# Patient Record
Sex: Female | Born: 1975 | Race: White | Hispanic: No | Marital: Married | Smoking: Former smoker
Health system: Southern US, Community
[De-identification: ages and names within clinical notes are randomized; demographics above are authoritative.]

## PROBLEM LIST (undated history)

## (undated) ENCOUNTER — Inpatient Hospital Stay (HOSPITAL_COMMUNITY): Payer: Self-pay

## (undated) DIAGNOSIS — F329 Major depressive disorder, single episode, unspecified: Secondary | ICD-10-CM

## (undated) DIAGNOSIS — I499 Cardiac arrhythmia, unspecified: Secondary | ICD-10-CM

## (undated) DIAGNOSIS — R519 Headache, unspecified: Secondary | ICD-10-CM

## (undated) DIAGNOSIS — E079 Disorder of thyroid, unspecified: Secondary | ICD-10-CM

## (undated) DIAGNOSIS — I2699 Other pulmonary embolism without acute cor pulmonale: Secondary | ICD-10-CM

## (undated) DIAGNOSIS — R011 Cardiac murmur, unspecified: Secondary | ICD-10-CM

## (undated) DIAGNOSIS — R51 Headache: Secondary | ICD-10-CM

## (undated) DIAGNOSIS — O09529 Supervision of elderly multigravida, unspecified trimester: Secondary | ICD-10-CM

## (undated) DIAGNOSIS — F32A Depression, unspecified: Secondary | ICD-10-CM

## (undated) DIAGNOSIS — R000 Tachycardia, unspecified: Secondary | ICD-10-CM

## (undated) DIAGNOSIS — B019 Varicella without complication: Secondary | ICD-10-CM

## (undated) DIAGNOSIS — R32 Unspecified urinary incontinence: Secondary | ICD-10-CM

## (undated) DIAGNOSIS — F418 Other specified anxiety disorders: Secondary | ICD-10-CM

## (undated) HISTORY — PX: TONSILLECTOMY: SUR1361

## (undated) HISTORY — DX: Varicella without complication: B01.9

## (undated) HISTORY — PX: AUGMENTATION MAMMAPLASTY: SUR837

## (undated) HISTORY — DX: Headache, unspecified: R51.9

## (undated) HISTORY — DX: Depression, unspecified: F32.A

## (undated) HISTORY — PX: TUBAL LIGATION: SHX77

## (undated) HISTORY — PX: BREAST ENHANCEMENT SURGERY: SHX7

## (undated) HISTORY — DX: Disorder of thyroid, unspecified: E07.9

## (undated) HISTORY — DX: Other specified anxiety disorders: F41.8

## (undated) HISTORY — DX: Major depressive disorder, single episode, unspecified: F32.9

## (undated) HISTORY — PX: KIDNEY SURGERY: SHX687

## (undated) HISTORY — DX: Headache: R51

## (undated) HISTORY — DX: Unspecified urinary incontinence: R32

## (undated) HISTORY — PX: ABDOMINOPLASTY: SUR9

## (undated) HISTORY — PX: MANDIBLE SURGERY: SHX707

## (undated) HISTORY — DX: Cardiac arrhythmia, unspecified: I49.9

---

## 1998-03-19 ENCOUNTER — Ambulatory Visit (HOSPITAL_COMMUNITY): Admission: RE | Admit: 1998-03-19 | Discharge: 1998-03-19 | Payer: Self-pay | Admitting: Urology

## 2000-03-22 ENCOUNTER — Ambulatory Visit (HOSPITAL_COMMUNITY): Admission: RE | Admit: 2000-03-22 | Discharge: 2000-03-22 | Payer: Self-pay | Admitting: Obstetrics & Gynecology

## 2000-06-09 ENCOUNTER — Inpatient Hospital Stay (HOSPITAL_COMMUNITY): Admission: AD | Admit: 2000-06-09 | Discharge: 2000-06-09 | Payer: Self-pay | Admitting: Obstetrics & Gynecology

## 2000-06-10 ENCOUNTER — Inpatient Hospital Stay (HOSPITAL_COMMUNITY): Admission: AD | Admit: 2000-06-10 | Discharge: 2000-06-10 | Payer: Self-pay | Admitting: Obstetrics and Gynecology

## 2000-06-18 ENCOUNTER — Inpatient Hospital Stay (HOSPITAL_COMMUNITY): Admission: AD | Admit: 2000-06-18 | Discharge: 2000-06-21 | Payer: Self-pay | Admitting: Obstetrics and Gynecology

## 2000-07-08 ENCOUNTER — Inpatient Hospital Stay (HOSPITAL_COMMUNITY): Admission: AD | Admit: 2000-07-08 | Discharge: 2000-07-08 | Payer: Self-pay | Admitting: Obstetrics and Gynecology

## 2000-11-12 ENCOUNTER — Other Ambulatory Visit: Admission: RE | Admit: 2000-11-12 | Discharge: 2000-11-12 | Payer: Self-pay | Admitting: Obstetrics and Gynecology

## 2001-09-20 ENCOUNTER — Other Ambulatory Visit: Admission: RE | Admit: 2001-09-20 | Discharge: 2001-09-20 | Payer: Self-pay | Admitting: Obstetrics and Gynecology

## 2002-02-17 ENCOUNTER — Inpatient Hospital Stay (HOSPITAL_COMMUNITY): Admission: AD | Admit: 2002-02-17 | Discharge: 2002-02-17 | Payer: Self-pay | Admitting: Obstetrics & Gynecology

## 2002-02-17 ENCOUNTER — Encounter: Payer: Self-pay | Admitting: Obstetrics & Gynecology

## 2002-04-02 ENCOUNTER — Inpatient Hospital Stay (HOSPITAL_COMMUNITY): Admission: AD | Admit: 2002-04-02 | Discharge: 2002-04-07 | Payer: Self-pay | Admitting: Obstetrics & Gynecology

## 2002-04-02 ENCOUNTER — Encounter: Payer: Self-pay | Admitting: Obstetrics & Gynecology

## 2002-04-08 ENCOUNTER — Inpatient Hospital Stay (HOSPITAL_COMMUNITY): Admission: AD | Admit: 2002-04-08 | Discharge: 2002-04-16 | Payer: Self-pay | Admitting: Obstetrics and Gynecology

## 2002-05-05 ENCOUNTER — Inpatient Hospital Stay (HOSPITAL_COMMUNITY): Admission: AD | Admit: 2002-05-05 | Discharge: 2002-05-07 | Payer: Self-pay | Admitting: Obstetrics and Gynecology

## 2002-05-05 ENCOUNTER — Encounter (INDEPENDENT_AMBULATORY_CARE_PROVIDER_SITE_OTHER): Payer: Self-pay | Admitting: Specialist

## 2002-05-08 ENCOUNTER — Encounter: Admission: RE | Admit: 2002-05-08 | Discharge: 2002-06-07 | Payer: Self-pay | Admitting: Obstetrics & Gynecology

## 2002-05-11 ENCOUNTER — Inpatient Hospital Stay (HOSPITAL_COMMUNITY): Admission: AD | Admit: 2002-05-11 | Discharge: 2002-05-11 | Payer: Self-pay | Admitting: Obstetrics & Gynecology

## 2002-06-08 ENCOUNTER — Encounter: Admission: RE | Admit: 2002-06-08 | Discharge: 2002-07-08 | Payer: Self-pay | Admitting: Obstetrics & Gynecology

## 2002-08-07 ENCOUNTER — Encounter: Admission: RE | Admit: 2002-08-07 | Discharge: 2002-09-06 | Payer: Self-pay | Admitting: Obstetrics & Gynecology

## 2003-03-31 ENCOUNTER — Other Ambulatory Visit: Admission: RE | Admit: 2003-03-31 | Discharge: 2003-03-31 | Payer: Self-pay | Admitting: Obstetrics & Gynecology

## 2003-12-30 ENCOUNTER — Inpatient Hospital Stay (HOSPITAL_COMMUNITY): Admission: AD | Admit: 2003-12-30 | Discharge: 2003-12-30 | Payer: Self-pay | Admitting: Obstetrics and Gynecology

## 2004-02-12 ENCOUNTER — Inpatient Hospital Stay (HOSPITAL_COMMUNITY): Admission: RE | Admit: 2004-02-12 | Discharge: 2004-02-14 | Payer: Self-pay | Admitting: Obstetrics and Gynecology

## 2004-06-16 ENCOUNTER — Other Ambulatory Visit: Admission: RE | Admit: 2004-06-16 | Discharge: 2004-06-16 | Payer: Self-pay | Admitting: Obstetrics and Gynecology

## 2004-07-22 ENCOUNTER — Encounter (INDEPENDENT_AMBULATORY_CARE_PROVIDER_SITE_OTHER): Payer: Self-pay | Admitting: *Deleted

## 2004-07-22 ENCOUNTER — Ambulatory Visit (HOSPITAL_COMMUNITY): Admission: RE | Admit: 2004-07-22 | Discharge: 2004-07-22 | Payer: Self-pay | Admitting: Obstetrics and Gynecology

## 2004-11-10 ENCOUNTER — Other Ambulatory Visit: Admission: RE | Admit: 2004-11-10 | Discharge: 2004-11-10 | Payer: Self-pay | Admitting: Obstetrics and Gynecology

## 2005-03-29 ENCOUNTER — Other Ambulatory Visit: Admission: RE | Admit: 2005-03-29 | Discharge: 2005-03-29 | Payer: Self-pay | Admitting: Obstetrics and Gynecology

## 2005-10-19 ENCOUNTER — Emergency Department (HOSPITAL_COMMUNITY): Admission: EM | Admit: 2005-10-19 | Discharge: 2005-10-19 | Payer: Self-pay | Admitting: Family Medicine

## 2006-08-09 ENCOUNTER — Ambulatory Visit (HOSPITAL_COMMUNITY): Admission: RE | Admit: 2006-08-09 | Discharge: 2006-08-09 | Payer: Self-pay | Admitting: Obstetrics and Gynecology

## 2006-11-09 ENCOUNTER — Emergency Department (HOSPITAL_COMMUNITY): Admission: EM | Admit: 2006-11-09 | Discharge: 2006-11-10 | Payer: Self-pay | Admitting: Emergency Medicine

## 2006-12-19 ENCOUNTER — Ambulatory Visit: Payer: Self-pay | Admitting: Internal Medicine

## 2006-12-19 LAB — CONVERTED CEMR LAB
CRP, High Sensitivity: 2 (ref 0.00–5.00)
IgA: 264 mg/dL (ref 68–378)
Sed Rate: 4 mm/hr (ref 0–25)
Tissue Transglutaminase Ab, IgA: 0.4 units (ref <7)

## 2006-12-20 ENCOUNTER — Encounter: Payer: Self-pay | Admitting: Internal Medicine

## 2007-01-01 ENCOUNTER — Encounter: Payer: Self-pay | Admitting: Internal Medicine

## 2007-01-01 ENCOUNTER — Ambulatory Visit: Payer: Self-pay | Admitting: Internal Medicine

## 2007-07-19 DIAGNOSIS — R51 Headache: Secondary | ICD-10-CM | POA: Insufficient documentation

## 2007-07-19 DIAGNOSIS — F411 Generalized anxiety disorder: Secondary | ICD-10-CM | POA: Insufficient documentation

## 2007-07-19 DIAGNOSIS — N816 Rectocele: Secondary | ICD-10-CM

## 2007-07-19 DIAGNOSIS — I10 Essential (primary) hypertension: Secondary | ICD-10-CM | POA: Insufficient documentation

## 2007-07-19 DIAGNOSIS — R519 Headache, unspecified: Secondary | ICD-10-CM | POA: Insufficient documentation

## 2007-07-19 DIAGNOSIS — K589 Irritable bowel syndrome without diarrhea: Secondary | ICD-10-CM

## 2008-04-24 HISTORY — PX: AUGMENTATION MAMMAPLASTY: SUR837

## 2009-07-05 ENCOUNTER — Emergency Department (HOSPITAL_COMMUNITY): Admission: EM | Admit: 2009-07-05 | Discharge: 2009-07-05 | Payer: Self-pay | Admitting: Emergency Medicine

## 2010-07-18 LAB — RAPID URINE DRUG SCREEN, HOSP PERFORMED
Amphetamines: NOT DETECTED
Opiates: NOT DETECTED
Tetrahydrocannabinol: NOT DETECTED

## 2010-07-18 LAB — DIFFERENTIAL
Eosinophils Absolute: 0 10*3/uL (ref 0.0–0.7)
Lymphs Abs: 1.9 10*3/uL (ref 0.7–4.0)
Monocytes Relative: 5 % (ref 3–12)
Neutrophils Relative %: 63 % (ref 43–77)

## 2010-07-18 LAB — BASIC METABOLIC PANEL
Chloride: 104 mEq/L (ref 96–112)
Creatinine, Ser: 0.8 mg/dL (ref 0.4–1.2)
GFR calc Af Amer: >60 mL/min (ref >60)
Potassium: 3.7 mEq/L (ref 3.5–5.1)
Sodium: 139 mEq/L (ref 135–145)

## 2010-07-18 LAB — POCT PREGNANCY, URINE: Preg Test, Ur: NEGATIVE

## 2010-07-18 LAB — CBC
HCT: 39.7 % (ref 36.0–46.0)
MCV: 96.1 fL (ref 78.0–100.0)
RBC: 4.14 MIL/uL (ref 3.87–5.11)
WBC: 6 10*3/uL (ref 4.0–10.5)

## 2010-07-18 LAB — ETHANOL: Alcohol, Ethyl (B): <5 mg/dL (ref 0–10)

## 2010-09-06 NOTE — Assessment & Plan Note (Signed)
[facility]                         GASTROENTEROLOGY OFFICE NOTE   NAME:[name], [name]                           MRN:          [id number]  DATE:12/19/2006                            DOB:          [birth year]    PRIMARY CARE PHYSICIAN:  Marjory Lies, M.D.   GYNECOLOGIST:  Miguel Aschoff, M.D.   CHIEF COMPLAINT:  Weight loss, diarrhea, abdominal pain.   ASSESSMENT:  A 35 year old white woman with chronic history of irritable  bowel and interstitial cystitis; however, since March, she has had  worsening left lower quadrant pain, diarrhea, and has lost weight, about  15 pounds, and she cannot gain that back.  The leading differential  diagnosis includes irritable bowel syndrome, which is commonly  associated with interstitial cystitis, celiac disease, and inflammatory  bowel disease, and infection (question Clostridium difficile) are all  possibilities as well.  I suspect she certainly does have irritable  bowel syndrome, but we need to be more sure of that.   PLAN:  1. Schedule colonoscopy with random biopsies.  2. Sprue panel, IgA level, sed rate, C-reactive protein, C. diff      toxin, and stool for lactoferrin.  Release of information from Dr.      Doristine Counter for recent labs.  3. Lomotil 1-2 q.a.c. and at bedtime, #90 prescribed.   I have reviewed risks, benefits and indications of colonoscopy. She  understands and agrees to proceed.   HISTORY:  As above.  This lady has had a long history of irritable  bowel, going back to her childhood; however, since March, she has had  worsening problems with crampy left lower quadrant pain and extreme  gastrocolic reflux.  She will have nocturnal stools as well, and she  says that this is different than any other irritable bowel problems that  she has had.  She says her stools have a bad odor.  Her infant son, or  at least toddler son, has had problems for two years and is better but  has had an extensive GI workup,  including endoscopic evaluation for  similar problems, but nothing was found.  The pain she gets will make  her nauseous and vomit at times.  The vomiting is not that common.  She  will have a couple of days where things are okay.  She cannot really eat  red meat or coffee.  She was given Symax Duotabs with little benefit, if  any.  Imodium seems to nauseate her.  Her weight is at a level to where  it was prior to having children but is down about 15 pounds.  She denies  any skin rash.  She has night sweats and says she has been febrile as  much as 101 or 102.  She was treated for a persistent urinary tract  infection over the summer.  Her symptoms antedate that.  She is not  describing bleeding.  The remainder of her GI review of systems appears  negative.   PAST MEDICAL HISTORY:  1. Interstitial cystitis.  2. Genitourinary surgery for vesicoureteral reflux.  3. Rectocele.  4. Numbness,  tingling, question multiple sclerosis.  Neurologic workup      with MRI, CT, EEG negative.  Due to see a rheumatologist, question      fibromyalgia.  5. Abdominoplasty in the past.  6. Chronic headaches.  7. Anxiety.  8. Irritable bowel syndrome.  9. Conization biopsy for abnormal Pap smear with dysplasia.  10.History of cesarean section.  11.History of dilatation and curettage.   FAMILY HISTORY:  Mother and grandmother have had endometrial carcinoma.  Mother has had heart disease.  Father had heart disease and  grandparents.  Diabetes in her mother and a grandparent.  Breast cancer  in an aunt and a grandmother.  An aunt has Crohn's disease.  Grandmother  had colon cancer.   SOCIAL HISTORY:  She is married.  She has a B.S. degree.  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  Some alcohol.  No tobacco or drugs.  Three children.  One son, two daughters.  Lives with her husband and children.   REVIEW OF SYSTEMS:  See my medical history  form for full details,  otherwise as above.   PHYSICAL EXAMINATION:  A well-developed and well-nourished but thin  white woman.  Height 5 feet 3.  Weight 106 pounds.  Her Body Mass Index would be 18.8  kg/m2, which is normal.  Weight is just low normal, underweight would be  less than 104 pounds.  HEENT:  Eyes are anicteric.  Normal mouth, lips, and pharynx.  NECK:  Supple.  No thyromegaly or mass.  CHEST:  Clear.  HEART:  S1 and S2.  No murmurs, rubs or gallops.  ABDOMEN:  She has some mild tenderness in the epigastric region  inferiorly, what feels like over the transverse colon.  There is a  little bit of fullness there.  There is no mass.  Her aorta is palpable  but normal for a thin person.  There is no organomegaly or mass.  RECTAL:  Deferred.  LYMPHATIC:  No neck, supraclavicular, or axillary adenopathy.  EXTREMITIES:  Free of edema.  SKIN:  No rash.  PSYCH:  She is alert and oriented x3.   I have reviewed Dr. Logan Bores' notes as well.  Also notes from Dr.  Aldean Ast.   I appreciate the opportunity to care for this patient.     Iva Boop, MD,FACG  Electronically Signed    CEG/MedQ  DD: 12/19/2006  DT: 12/19/2006  Job #: [id number]   cc:   Jamison Neighbor, M.D.  Marjory Lies, M.D.  Miguel Aschoff, M.D.

## 2010-09-09 NOTE — Op Note (Signed)
NAMESTAPHANY, [name] [name]                              ACCOUNT NO.:  [id number]   MEDICAL RECORD NO.:  [id number]                   PATIENT TYPE:  INP   LOCATION:  9198                                 [facility]ILITY:  [facility]   PHYSICIAN:  Ilda Mori, M.D.                DATE OF BIRTH:  1975-04-25   DATE OF PROCEDURE:  05/05/2002  DATE OF DISCHARGE:                                 OPERATIVE REPORT   PREOPERATIVE DIAGNOSES:  1. Placenta previa.  2. Third trimester bleeding.  3. Oligohydramnios.  4. Fetal growth restriction.   POSTOPERATIVE DIAGNOSES:  1. Placenta previa.  2. Third trimester bleeding.  3. Oligohydramnios.  4. Fetal growth restriction.  5. Velamentous insertion of the umbilical cord.   PROCEDURE:  Primary low transverse cesarean section.   SURGEON:  Ilda Mori, M.D.   ASSISTANT:  Malva Limes, M.D.   ANESTHESIA:  Spinal.   ESTIMATED BLOOD LOSS:  1000 cubic centimeters.   FINDINGS:  Female infant.  Apgar scores 8 and 8.  Birth weight 4 pounds 3  ounces.  Clear amniotic fluid.  Placenta previa.  Velamentous insertion of  the cord.  Normal appearing tubes and ovaries.   INDICATIONS:  This is a 35 year old gravida 3, para 1 with estimated date of  confinement of February 28 at [redacted] weeks gestation who is admitted with third  trimester bleeding from a known placenta previa.  The patient was seen in  the office earlier today prior to the onset of bleeding and an ultrasound  was done at that time which showed fetal growth restriction and  oligohydramnios.  The estimated fetal weight was less than fifth percentile  and the amniotic fluid volume was 5.3.  During the office visit the patient  began bleeding and in view of her bleeding, known placenta previa, and  evidence of fetal compromise, decision was made to proceed with delivery.   PROCEDURE:  The patient was taken to the operating room and spinal  anesthesia was placed.  She was then placed in the supine position  with left  lateral displacement of the uterus and the abdomen was prepped and draped in  a sterile fashion.  The bladder was catheterized.  A lower transverse  incision was made through the previous laparotomy scar and taken down to the  fascia which was then extended transversely.  The rectus muscle was  dissected from the fascial sheath and then divided in the midline.  The  peritoneal cavity was entered sharply, extended vertically.  The lower  segment was identified and a transverse incision was made through the  serosa.  The bladder was displaced inferiorly and the incision was carried  down to the amniotic cavity.  The transverse incision was extended bluntly.  The infant was delivered with fundal pressure.  Cord blood was obtained.  The placenta was delivered with cord traction.  The uterine cavity was  then  bluntly curettaged to remove the placental fragments and membranes.  The  lower segment was then closed with a running interlocking Vicryl 1 suture.  The bladder plane was not reopposed.  The perineum and rectus muscle was  closed in the midline.  The fascia was closed with a running 0 Vicryl suture  and the skin was closed with staples.  The patient tolerated procedure well  and left the operating room in good condition.                                               Ilda Mori, M.D.    RK/MEDQ  D:  05/05/2002  T:  05/05/2002  Job:  [id number]

## 2010-09-09 NOTE — Discharge Summary (Signed)
[name] [name], [name] [name]                  ACCOUNT NO.:  [id number]   MEDICAL RECORD NO.:  [id number]          PATIENT TYPE:  INP   LOCATION:  9112                          [facility]ILITY[facility]   PHYSICIAN:  Gerrit Friends. Aldona Bar, M.D.   DATE OF BIRTH:  [birth year]   DATE OF ADMISSION:  02/12/2004  DATE OF DISCHARGE:  02/14/2004                                 DISCHARGE SUMMARY   DISCHARGE DIAGNOSES:  1.  Term pregnancy, delivered 7 pound 9 ounce female infant, Apgars 9 and 9.  2.  Blood type A positive.  3.  Previous cesarean section/desire for repeat cesarean section.   PROCEDURES:  Repeat low transverse cesarean section.   SUMMARY:  This 35 year old, gravida 4, para 2, was taken to the operating  room on the day of admission for repeat low transverse cesarean section  which went without incident.  She was delivered of a 7 pound 9 ounce female  infant with good Apgars.  Her postpartum course was benign.  Her discharge  hemoglobin was 10.8 with a white count of 9700 and a platelet count of  210,000.  On the morning of October 23, she was very desirous of discharge.  She was ambulating well, tolerating a regular diet well, had a normal bowel  and bladder function, was afebrile.  Her wound was clean and dry, and her  breast feeding was going well.  Accordingly, she is given pertinent  instructions and understands instructions well.   DISCHARGE MEDICATIONS:  1.  Vitamins 1 daily.  2.  Ferrous sulfate 300 mg daily.  3.  Motrin 600 mg q.6h. p.r.n. pain.  4.  Tylox 1-2 q.4-6h. p.r.n. severe pain.   She will return to the office in 2 days for staple removal.  She was given  all explicit careful instructions at the time of discharge and understood  all instructions well (per discharge brochure).   CONDITION ON DISCHARGE:  Improved.      RMW/MEDQ  D:  02/14/2004  T:  02/14/2004  Job:  [id number]

## 2010-09-09 NOTE — Op Note (Signed)
NAMEISAAC, [name] [name]                  ACCOUNT NO.:  [id number]   MEDICAL RECORD NO.:  [id number]          PATIENT TYPE:  AMB   LOCATION:  SDC                           [facility]   PHYSICIAN:  Miguel Aschoff, M.D.       DATE OF BIRTH:  1976-03-14   DATE OF PROCEDURE:  08/09/2006  DATE OF DISCHARGE:                               OPERATIVE REPORT   PREOPERATIVE DIAGNOSIS:  Symptomatic rectocele, asymmetric labia minora.   POSTOPERATIVE DIAGNOSIS:  Symptomatic rectocele, asymmetric labia  minora.   PROCEDURE:  Repair of rectocele; labioplasty on right labia.   SURGEON:  Dr. Miguel Aschoff.   ASSISTANT:  Dr. Lodema Hong.   ANESTHESIA:  General.   COMPLICATIONS:  None.   JUSTIFICATION:  The patient is 35 year old white female, para 3-0-1-3  reports noticing a bulge in the area the vagina.  On examination, she is  noted to have significant rectocele in the lower portion of the vagina  towards the hymenal ring.  No other abnormalities were noted of the  vagina and no cystocele exists.  There was good anal tone.  The patient  also reports that during intercourse she is having discomfort because  her right labia is being pulled into the vagina and is larger than the  left, and she requests at that time of the repair of the rectocele that  the symmetry between the labia be corrected if possible.  The risks and  benefits of this procedure were discussed with the patient.  Informed  consent has been obtained.   PROCEDURE:  The patient was taken to the operating room, placed in  supine position.  General anesthesia was administered without  difficulty.  She was then placed in dorsolithotomy position, prepped and  draped in the usual sterile fashion.  Bladder was catheterized.  After  this was done, again examination confirmed the presence of the  rectocele.  The posterior vaginal wall was then injected with 0.50%  Xylocaine with epinephrine for hemostasis.  Then, a small ellipse of  perineal  skin was incised and elevated, and then the midline of the  vagina was identified, grasped with Allis clamps for a depth of  approximately 5 cm.  Then in the midline, dissection was carried out,  separating the overlying vaginal mucosa from the underlying pararectal  tissues.  After this was done, the fascia was dissected off the vaginal  mucosa, the rectocele identified and reduced.  This was done using  interrupted 2-0 Vicryl sutures.  Then, the fascia was sutured over the  rectocele repair using interrupted 2-0 Vicryl sutures.  The excess  vaginal mucosa was then trimmed, and the vaginal mucosa was  reapproximated using running interlocking 2-0 Vicryl sutures.  At this  point, the perineal body was reconstituted.  A crown suture of 2-0  Vicryl was used to approximate the perineal muscles, and then the  subcutaneous tissue was closed using interrupted 2-0 Vicryl, and then,  skin was reapproximated using subcuticular 2-0 Vicryl.  After this was  done, attention was directed to the labia using as a template  the left  labia which the patient considered normal, an outline of the requested  size was templated across the right labia.  Then, the skin of the right  baby was incised.  The excess labial tissue removed without difficulty.  Hemostasis was brought under control by using interrupted 5-0 Vicryl  Rapide sutures.  After this was done, the skin was reapproximated using  subcuticular 5-0 Vicryl suture.  A gauze pack was then applied for  hemostasis.  Estimated blood loss for the entire procedure was  approximately 20 mL.  The patient tolerated the procedure well and went  to the recovery room in satisfactory condition.  The patient was placed  on a 23-hour observation status; however, if she is feeling comfortable  enough at the end of observation in the recovery room, she can go home.   Medications for home will include Vicodin 1 every 3 hours as needed for  pain.  She was instructed to  place ice packs on her vulva for 24 hours  and then sitz baths after that.  She will be seen back in 4 weeks for  followup examination.  She is to call if there are any problems such as  fever, pain or heavy bleeding.      Miguel Aschoff, M.D.  Electronically Signed     AR/MEDQ  D:  08/09/2006  T:  08/09/2006  Job:  [id number]

## 2010-09-09 NOTE — Discharge Summary (Signed)
[name] [name], [name] [name]                              ACCOUNT NO.:  [id number]   MEDICAL RECORD NO.:  [id number]                   PATIENT TYPE:  MAT   LOCATION:  MATC                                 [facility]   PHYSICIAN:  Leilani Able, P.A.-C.          DATE OF BIRTH:  [birth year]   DATE OF ADMISSION:  05/11/2002  DATE OF DISCHARGE:  05/11/2002                                 DISCHARGE SUMMARY   FINAL DIAGNOSES:  Intrauterine pregnancy at [redacted] weeks gestation, placenta  previa, third trimester bleeding, oligohydramnios and fetal growth  restriction with obstruction of umbilical cord.   PROCEDURE:  Primary low transverse Cesarean section.   SURGEON:  Ilda Mori, M.D.   ASSISTANT:  Malva Limes, M.D.   COMPLICATIONS:  None.   HISTORY OF PRESENT ILLNESS:  This 35 year old Gravida III, Para 1, 0/1/1  presents at [redacted] weeks gestation with continued bleeding episode secondary to  placenta previa. The patient's antepartum course had been complicated by  placenta previa diagnosed at 20 week ultrasound. She had had three episodes  of bleeding at 23 weeks, 28 weeks, and 30 weeks. The patient was  hospitalized for the last two episodes. Was always sent home stable and on  Terbutaline and bedrest. The patient presented to the office on May 05, 2002 and an ultrasound showed an AFI of 5.3 and estimated fetal weight of  about 14 pounds. The patient began to bleed during her visit and was sent to  triage. At this point, it was felt to proceed with immediate Cesarean  section secondary to the vaginal bleeding, fetal growth restriction and  oligohydramnios with placenta previa.   HOSPITAL COURSE:  The patient was taken to the OR by Dr. Ilda Mori  where a primary low transverse Cesarean section was performed with the  deliver of a 4 pound 3 ounce female infant with Apgar's of 8 and 8. There  was clear amniotic fluid noted and a velamentous cord insertion. The  delivery went  without complications and the patient's postoperative course  was benign without significant fevers. She was felt ready for discharge on  postoperative day two. The baby was in the NICU on C-pap but doing well. She  was sent home on a regular diet.  Told to decrease activities. Told to continue prenatal vitamins and FESF-4.  She was given prescription for Tylenol 1-2 every four hour as needed pain  and was told to follow-up in the office in four weeks.   LABORATORY DATA:  On discharge, the patient had a hemoglobin of 10.1, WBC  count of 9.6.                                               Leilani Able, P.A.-C.    MB/MEDQ  D:  06/19/2002  T:  06/19/2002  Job:  [id number]   cc:   Miguel Aschoff, M.D.  [address], [address]  [place]  [place]  [address]  Fax: [phone]

## 2010-09-09 NOTE — Op Note (Signed)
[name], [name]                  ACCOUNT NO.:  [id number]   MEDICAL RECORD NO.:  [id number]          PATIENT TYPE:  AMB   LOCATION:  SDC                           [facility]   PHYSICIAN:  Miguel Aschoff, M.D.       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   PROCEDURE:  Cone biopsy and endocervical curettage.   SURGEON:  Miguel Aschoff, M.D.   ANESTHESIA:  General.   COMPLICATIONS:  None.   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  The pathology revealed that the endocervical glands were  involved with the process.  Due to the pathology report, the patient is  being taken now to undergo cone biopsy and ECC as both a therapeutic and  diagnostic procedure.  The risks and benefits of the procedure were  discussed with the patient.   PROCEDURE:  The patient was taken to the operating room and placed in the  supine position, and general anesthesia was administered without difficulty.  She was then placed in the dorsal lithotomy position and prepped and draped  in the usual sterile fashion.  The bladder was catheterized.  At this point  figure-of-eight sutures were placed from the 2 o'clock to 4 o'clock  positions and from the 8 o'clock to 10 o'clock positions to include  extending branch of the cervical arteries.  Then Lugol's stain was applied  to the cervix.  It appeared to be taken up __________  on the ectocervix.  No gross lesions were noted.  Then the cervix was injected with dilute  Pitressin solution, 1 amp of Pitressin in 20 mL of saline, 18 mL were used.  This was done  for hemostasis.  Then a cone of tissue was cut, excising the  endocervical canal.  This was tagged at the 12 o'clock position with a  suture.  The residual portion of the endocervical canal was then curetted  using an endocervical curette.  At this point the bed of the cone biopsy  site was cauterized with electrocautery and hemostasis achieved.  A Gelfoam  pack was placed into the defect and the previously-placed sutures were tied  across the cervix to hold the Gelfoam in place.  The estimated blood loss  was less than 15 mL.   The plan is for the patient to be discharged home.  Medications for home  include doxycycline 100 mg twice a day x3 days, Darvocet-N 100 one every  four hours as needed for pain.  She was instructed to place nothing in  vagina, to call if there were any problems such as fever, pain or heavy  bleeding.  She will be seen back in four weeks for follow-up examination,  and she is to call in one week  for a pathology report.      AR/MEDQ  D:  07/22/2004  T:  07/22/2004  Job:  [id number]

## 2010-09-09 NOTE — Discharge Summary (Signed)
   NAMEJAEDAN, [name] [name]                              ACCOUNT NO.:  [id number]   MEDICAL RECORD NO.:  [id number]                   PATIENT TYPE:  INP   LOCATION:  9159                                 [facility]   PHYSICIAN:  Malva Limes, M.D.                 DATE OF BIRTH:  March 09, 1976   DATE OF ADMISSION:  04/02/2002  DATE OF DISCHARGE:  04/07/2002                                 DISCHARGE SUMMARY   FINAL DIAGNOSES:  Intrauterine pregnancy at 70 and 4/7th weeks gestation,  complete placenta previa and bright red bleeding episode times two.   HOSPITAL COURSE:  This 35 year old Gravida III, Para I, 0/1/1 presents at 53  and 4/7th weeks gestation with contractions and bright red bleeding on  admission. The patient had reassuring fetal heart tones. An ultrasound  performed on April 03, 2003 confirmed the persistent previa but her  cervix is still at 4.5 cm in length and the baby was vertex. Hemoglobin 11.1  on admission. At this point, the patient was given betamethasone, oral  terbutaline, and bedrest in the hospital. The patient was monitored with  reactive non-stress test. On hospital day two the patient had another  episode of bright red bleeding but still without contractions. The rest of  the patient's hospital stay was stable. The bleeding did subside and she was  just left in the hospital on observation. By hospital day six, the patient  had not had any more bleeding in 48 hours and still without contraction. She  was felt ready for discharge on complete bedrest, to continue her  terbutaline. She is to return to the office on Thursday. Of course, is to  call with any contractions or another episode of bleeding. The patient will  be staying with her mother for the rest of the pregnancy.     [name] [name], P.A.-C.                Malva Limes, M.D.    MB/MEDQ  D:  06/19/2002  T:  06/19/2002  Job:  [id number]   cc:   Malva Limes, M.D.  [address]  [place]  [place] [address]  Fax: [phone]

## 2010-09-09 NOTE — Op Note (Signed)
NAMEERRIN, [name] [name]                  ACCOUNT NO.:  [id number]   MEDICAL RECORD NO.:  [id number]          PATIENT TYPE:  INP   LOCATION:  9198                          FACILITY:  [facility]   PHYSICIAN:  Miguel Aschoff, M.D.       DATE OF BIRTH:  [birth year]   DATE OF PROCEDURE:  02/12/2004  DATE OF DISCHARGE:                                 OPERATIVE REPORT   PREOPERATIVE DIAGNOSES:  Intrauterine pregnancy at term for elective repeat  cesarean section.   POSTOPERATIVE DIAGNOSES:  Intrauterine pregnancy at term for elective repeat  cesarean section with delivery of viable female infant, Apgar's 9 and 9.   PROCEDURE:  Repeat low flap transverse cesarean section.   SURGEON:  Miguel Aschoff, M.D.   ASSISTANT:  Carrington Clamp, M.D.   ANESTHESIA:  Spinal.   COMPLICATIONS:  None.   JUSTIFICATION:  The patient is a 35 year old white female, gravida 4, para 2-  0-1-2 with an estimated date of confinement of February 27, 2004.  The  patient had a previous cesarean section for placenta previa and now presents  to undergo elective repeat cesarean section at 38 weeks. The risks and  benefits of this procedure were discussed with the patient as well as the  option for a VBAC. All informed consents were obtained.   DESCRIPTION OF PROCEDURE:  The patient was taken to the operating room,  placed in a sitting position and spinal anesthesia was administered without  difficulty. She was then placed in the supine position deviated to the left  and prepped and draped in the usual sterile fashion. A Pfannenstiel incision  was made, extended out through the subcutaneous tissue with bleeding points  being clamped and coagulated as they were encountered. The fascia was then  identified and incised transversely. It was then separated from the  underlying rectus muscles. The rectus muscles were divided in the midline,  peritoneum was then found and entered carefully avoiding the underlying  structures.  At this point, a  bladder flap was created and protected with a  bladder blade and then an elliptical transverse incision was made in the  lower uterine segment, the amniotic cavity was entered, clear fluid was  obtained. At this point, the patient was delivered of a viable female infant,  Apgar 9 a 1 minute and 9 at 5 minutes from a vertex LOA position.  Nuchal  cord x1 was noted. The baby was then handed to the pediatric team in  attendance. Cord bloods were then obtained for appropriate studies. The  placenta was then delivered without difficulty and then the uterus was  evacuated of any remaining products of conception. The angles of the uterine  incision were then ligated using figure-of-eight sutures of #1 Vicryl then  the uterus was closed in layers. The first layer was running interlocking  suture of #1 Vicryl followed by an imbricating suture of #1 Vicryl.  The  bladder flap was then reapproximated using running continuous 2-0 Vicryl  suture.  At this point, the tubes and ovaries were inspected and were noted  to be within  normal limits and then the abdomen was closed. The  parietoperitoneum was closed using running continuous #0 Vicryl suture. The  rectus muscles were reapproximated using running continuous #0 Vicryl  suture. The fascia was closed using two sutures of #0 Vicryl each starting  at the lateral fascial angles and meeting in  the midline. The subcutaneous tissue was closed using interrupted #0 Vicryl  suture and then the skin incision was closed using staples.  The estimated  blood loss from the procedure is approximately 600 mL.  The patient  tolerated the procedure well and went to the recovery room in satisfactory  condition.      AR/MEDQ  D:  02/12/2004  T:  02/12/2004  Job:  [id number]

## 2010-09-09 NOTE — Discharge Summary (Signed)
   NAMEKATY, [name] [name]                              ACCOUNT NO.:  [id number]   MEDICAL RECORD NO.:  [id number]                   PATIENT TYPE:  INP   LOCATION:  9151                                 FACILITY:  [facility]   PHYSICIAN:  Ilda Mori, M.D.                DATE OF BIRTH:  May 28, 1975   DATE OF ADMISSION:  04/08/2002  DATE OF DISCHARGE:  04/16/2002                                 DISCHARGE SUMMARY   FINAL DIAGNOSES:  1. Intrauterine pregnancy at 35 and 3/[redacted] weeks gestation.  2. Posterior placenta previa.  3. Third episode of bleeding during this pregnancy.   HOSPITAL COURSE:  This 35 year old G3, P1-0-1-1 presents at 63 and 3/[redacted] weeks  gestation with another bleed during this pregnancy.  The patient had been  diagnosed with placenta previa about 20 weeks.  She had a first bleed back  in October without an admission, a second bleed with clots back in the  beginning of December which she was hospitalized for.  She did stabilize and  was sent home on terbutaline.  The patient presents now with bleeding and  some clots.  The patient is not really contracting or having any pain at  this point.  She is admitted now for terbutaline and to be watched.  Her  hemoglobin upon admission is 11.4.  The bleeding by hospital day two had  stopped.  The patient had received steroid injections with her last  pregnancy so she was just continued on bed rest during this pregnancy.  The  patient continued to have reactive nonstress test and no contractions and  she was stable throughout her hospital course.  By hospital day number eight  patient was still stable.  Hemoglobin was still above 11.  She was not  having any bleeding or contractions.  Felt ready to be discharged again on  home bed rest.  The patient was sent home on a regular diet, of course, to  continue her complete bed rest and her prenatal vitamins.  Was told to  continue her terbutaline 2.5 mg one q.4-6h. as needed and was to follow up  in  the office weekly or to call with any increased bleeding.   LABORATORIES:  On discharge patient's hemoglobin as of January 18 was 11.4.  White blood cell count was 6.4.     Leilani Able, P.A.-C.                Ilda Mori, M.D.    MB/MEDQ  D:  06/19/2002  T:  06/19/2002  Job:  [id number]

## 2011-02-06 LAB — DIFFERENTIAL
Basophils Absolute: 0
Basophils Relative: 0
Eosinophils Absolute: 0
Eosinophils Relative: 0
Lymphocytes Relative: 40
Lymphs Abs: 2.6
Monocytes Absolute: 0.3
Monocytes Relative: 5
Neutro Abs: 3.6
Neutrophils Relative %: 54

## 2011-02-06 LAB — URINALYSIS, ROUTINE W REFLEX MICROSCOPIC
Bilirubin Urine: NEGATIVE
Glucose, UA: NEGATIVE
Hgb urine dipstick: NEGATIVE
Ketones, ur: NEGATIVE
Nitrite: NEGATIVE
Protein, ur: NEGATIVE
Specific Gravity, Urine: 1.018
Urobilinogen, UA: 0.2
pH: 6

## 2011-02-06 LAB — CBC
HCT: 35.4 — ABNORMAL LOW
Hemoglobin: 12.2
MCHC: 34.4
MCV: 91.2
Platelets: 252
RBC: 3.88
RDW: 12.1
WBC: 6.6

## 2011-02-06 LAB — URINE MICROSCOPIC-ADD ON

## 2011-02-06 LAB — BASIC METABOLIC PANEL
GFR calc Af Amer: >60
GFR calc non Af Amer: >60
Potassium: 3.7
Sodium: 137

## 2011-02-06 LAB — BASIC METABOLIC PANEL WITH GFR
BUN: 14
CO2: 26
Calcium: 8.9
Chloride: 106
Creatinine, Ser: 0.82
Glucose, Bld: 102 — ABNORMAL HIGH

## 2011-02-06 LAB — T4, FREE: Free T4: 1.15

## 2011-02-06 LAB — TSH: TSH: 1.79

## 2011-07-25 ENCOUNTER — Other Ambulatory Visit: Payer: Self-pay | Admitting: Obstetrics and Gynecology

## 2012-07-26 ENCOUNTER — Other Ambulatory Visit: Payer: Self-pay | Admitting: Obstetrics and Gynecology

## 2013-05-14 ENCOUNTER — Other Ambulatory Visit: Payer: Self-pay | Admitting: Obstetrics and Gynecology

## 2013-05-14 DIAGNOSIS — N63 Unspecified lump in unspecified breast: Secondary | ICD-10-CM

## 2013-05-21 ENCOUNTER — Ambulatory Visit
Admission: RE | Admit: 2013-05-21 | Discharge: 2013-05-21 | Disposition: A | Discharge status: Home / Self Care | Payer: BC Managed Care – PPO | Source: Ambulatory Visit | Attending: Obstetrics and Gynecology | Admitting: Obstetrics and Gynecology

## 2013-05-21 ENCOUNTER — Other Ambulatory Visit: Payer: Self-pay | Admitting: Obstetrics and Gynecology

## 2013-05-21 DIAGNOSIS — N63 Unspecified lump in unspecified breast: Secondary | ICD-10-CM

## 2014-03-06 ENCOUNTER — Other Ambulatory Visit: Payer: Self-pay | Admitting: Obstetrics and Gynecology

## 2014-03-06 DIAGNOSIS — R1032 Left lower quadrant pain: Secondary | ICD-10-CM

## 2014-03-11 ENCOUNTER — Other Ambulatory Visit: Payer: BC Managed Care – PPO

## 2016-04-05 LAB — OB RESULTS CONSOLE ABO/RH: RH TYPE: POSITIVE

## 2016-04-05 LAB — OB RESULTS CONSOLE HEPATITIS B SURFACE ANTIGEN: Hepatitis B Surface Ag: NEGATIVE

## 2016-04-05 LAB — OB RESULTS CONSOLE GC/CHLAMYDIA
Chlamydia: NEGATIVE
Gonorrhea: NEGATIVE

## 2016-04-05 LAB — OB RESULTS CONSOLE RUBELLA ANTIBODY, IGM: Rubella: IMMUNE

## 2016-04-05 LAB — OB RESULTS CONSOLE RPR: RPR: NONREACTIVE

## 2016-04-05 LAB — OB RESULTS CONSOLE ANTIBODY SCREEN: ANTIBODY SCREEN: NEGATIVE

## 2016-04-05 LAB — OB RESULTS CONSOLE HIV ANTIBODY (ROUTINE TESTING): HIV: NONREACTIVE

## 2016-09-13 ENCOUNTER — Other Ambulatory Visit: Payer: Self-pay | Admitting: Obstetrics and Gynecology

## 2016-10-05 ENCOUNTER — Inpatient Hospital Stay (HOSPITAL_COMMUNITY)
Admission: AD | Admit: 2016-10-05 | Discharge: 2016-10-05 | Disposition: A | Discharge status: Other Acute Inpatient Hospital | Payer: 59 | Source: Ambulatory Visit | Attending: Obstetrics and Gynecology | Admitting: Obstetrics and Gynecology

## 2016-10-05 ENCOUNTER — Observation Stay (HOSPITAL_COMMUNITY)
Admission: AD | Admit: 2016-10-05 | Discharge: 2016-10-06 | Disposition: A | Discharge status: Home / Self Care | Payer: 59 | Source: Ambulatory Visit | Attending: Emergency Medicine | Admitting: Emergency Medicine

## 2016-10-05 ENCOUNTER — Encounter (HOSPITAL_COMMUNITY): Payer: Self-pay

## 2016-10-05 ENCOUNTER — Observation Stay (HOSPITAL_BASED_OUTPATIENT_CLINIC_OR_DEPARTMENT_OTHER): Payer: 59

## 2016-10-05 ENCOUNTER — Inpatient Hospital Stay (HOSPITAL_COMMUNITY): Payer: 59

## 2016-10-05 DIAGNOSIS — Z3A35 35 weeks gestation of pregnancy: Secondary | ICD-10-CM | POA: Insufficient documentation

## 2016-10-05 DIAGNOSIS — R079 Chest pain, unspecified: Secondary | ICD-10-CM | POA: Diagnosis not present

## 2016-10-05 DIAGNOSIS — O99513 Diseases of the respiratory system complicating pregnancy, third trimester: Principal | ICD-10-CM | POA: Insufficient documentation

## 2016-10-05 DIAGNOSIS — I360 Nonrheumatic tricuspid (valve) stenosis: Secondary | ICD-10-CM | POA: Diagnosis not present

## 2016-10-05 DIAGNOSIS — I10 Essential (primary) hypertension: Secondary | ICD-10-CM | POA: Diagnosis not present

## 2016-10-05 DIAGNOSIS — I499 Cardiac arrhythmia, unspecified: Secondary | ICD-10-CM | POA: Insufficient documentation

## 2016-10-05 DIAGNOSIS — O9989 Other specified diseases and conditions complicating pregnancy, childbirth and the puerperium: Secondary | ICD-10-CM | POA: Diagnosis not present

## 2016-10-05 DIAGNOSIS — R0602 Shortness of breath: Secondary | ICD-10-CM | POA: Diagnosis not present

## 2016-10-05 DIAGNOSIS — I441 Atrioventricular block, second degree: Secondary | ICD-10-CM

## 2016-10-05 DIAGNOSIS — R002 Palpitations: Secondary | ICD-10-CM | POA: Diagnosis not present

## 2016-10-05 DIAGNOSIS — O26893 Other specified pregnancy related conditions, third trimester: Secondary | ICD-10-CM

## 2016-10-05 DIAGNOSIS — R0989 Other specified symptoms and signs involving the circulatory and respiratory systems: Secondary | ICD-10-CM | POA: Diagnosis not present

## 2016-10-05 DIAGNOSIS — Z3490 Encounter for supervision of normal pregnancy, unspecified, unspecified trimester: Secondary | ICD-10-CM

## 2016-10-05 HISTORY — DX: Tachycardia, unspecified: R00.0

## 2016-10-05 HISTORY — DX: Cardiac murmur, unspecified: R01.1

## 2016-10-05 LAB — CBC
HEMATOCRIT: 33.1 % — AB (ref 36.0–46.0)
Hemoglobin: 11.7 g/dL — ABNORMAL LOW (ref 12.0–15.0)
MCH: 33.5 pg (ref 26.0–34.0)
MCHC: 35.3 g/dL (ref 30.0–36.0)
MCV: 94.8 fL (ref 78.0–100.0)
Platelets: 222 10*3/uL (ref 150–400)
RBC: 3.49 MIL/uL — ABNORMAL LOW (ref 3.87–5.11)
RDW: 13.2 % (ref 11.5–15.5)
WBC: 11.5 10*3/uL — AB (ref 4.0–10.5)

## 2016-10-05 LAB — PROTEIN / CREATININE RATIO, URINE
Creatinine, Urine: 183 mg/dL
Protein Creatinine Ratio: 0.14 mg/mg{Cre} (ref 0.00–0.15)
Total Protein, Urine: 25 mg/dL

## 2016-10-05 LAB — COMPREHENSIVE METABOLIC PANEL
ALBUMIN: 3.3 g/dL — AB (ref 3.5–5.0)
ALT: 14 U/L (ref 14–54)
AST: 21 U/L (ref 15–41)
Alkaline Phosphatase: 106 U/L (ref 38–126)
Anion gap: 8 (ref 5–15)
BILIRUBIN TOTAL: 0.5 mg/dL (ref 0.3–1.2)
BUN: 8 mg/dL (ref 6–20)
CO2: 20 mmol/L — ABNORMAL LOW (ref 22–32)
Calcium: 8.9 mg/dL (ref 8.9–10.3)
Chloride: 106 mmol/L (ref 101–111)
Creatinine, Ser: 0.85 mg/dL (ref 0.44–1.00)
GFR calc Af Amer: >60 mL/min (ref >60)
GFR calc non Af Amer: >60 mL/min (ref >60)
Glucose, Bld: 81 mg/dL (ref 65–99)
POTASSIUM: 3.9 mmol/L (ref 3.5–5.1)
Sodium: 134 mmol/L — ABNORMAL LOW (ref 135–145)
Total Protein: 6.7 g/dL (ref 6.5–8.1)

## 2016-10-05 LAB — MAGNESIUM: Magnesium: 1.5 mg/dL — ABNORMAL LOW (ref 1.7–2.4)

## 2016-10-05 LAB — TROPONIN I: Troponin I: <0.03 ng/mL (ref <0.03)

## 2016-10-05 LAB — OB RESULTS CONSOLE GBS: STREP GROUP B AG: NEGATIVE

## 2016-10-05 MED ORDER — FAMOTIDINE IN NACL 20-0.9 MG/50ML-% IV SOLN
20.0000 mg | Freq: Once | INTRAVENOUS | Status: AC
  Administered 2016-10-05: 20 mg via INTRAVENOUS
  Filled 2016-10-05: qty 50

## 2016-10-05 MED ORDER — LACTATED RINGERS IV BOLUS (SEPSIS)
1000.0000 mL | Freq: Once | INTRAVENOUS | Status: AC
  Administered 2016-10-05: 1000 mL via INTRAVENOUS

## 2016-10-05 MED ORDER — BUTORPHANOL TARTRATE 1 MG/ML IJ SOLN
1.0000 mg | Freq: Once | INTRAMUSCULAR | Status: AC
  Administered 2016-10-05: 1 mg via INTRAVENOUS
  Filled 2016-10-05: qty 1

## 2016-10-05 MED ORDER — LACTATED RINGERS IV SOLN: INTRAVENOUS | Status: DC

## 2016-10-05 MED ORDER — PROMETHAZINE HCL 25 MG/ML IJ SOLN
6.2500 mg | Freq: Three times a day (TID) | INTRAMUSCULAR | Status: DC | PRN
  Administered 2016-10-05: 6.25 mg via INTRAVENOUS
  Filled 2016-10-05: qty 1

## 2016-10-05 MED ORDER — ACETAMINOPHEN 325 MG PO TABS: 650.0000 mg | ORAL_TABLET | ORAL | Status: DC | PRN

## 2016-10-05 NOTE — MAU Provider Note (Signed)
Chief Complaint:  Chest Pain and Shortness of Breath   First Provider Initiated Contact with Patient 10/05/16 1655     HPI: [name] [name] is a 41 y.o. [id number] at 12w4dwho presents to maternity admissions reporting irregular heartbeat, episodes of fast heartbeat, shortness of breath and feeling like a "cinder block is on my chest".  States feels like she is going to pass out during episodes. She reports good fetal movement, denies LOF, vaginal bleeding, vaginal itching/burning, urinary symptoms, h/a, n/v, diarrhea, constipation or fever/chills.  She denies headache, visual changes or RUQ abdominal pain.  States has had "heart problems all my life". Has a history of preeclampsia in past.   Chest Pain   This is a recurrent problem. The current episode started in the past 7 days. The problem occurs intermittently. The problem has been waxing and waning. The pain is present in the substernal region. The pain is mild. The quality of the pain is described as heavy and pressure. The pain does not radiate. Associated symptoms include dizziness, irregular heartbeat, near-syncope, palpitations and shortness of breath. Pertinent negatives include no abdominal pain, back pain, cough, diaphoresis, fever, headaches, leg pain, lower extremity edema, malaise/fatigue, nausea, numbness, sputum production, syncope, vomiting or weakness. The pain is aggravated by nothing (Happens at rest and with exertion). She has tried rest for the symptoms. The treatment provided no relief.  Her past medical history is significant for hypertension. Prior diagnostic workup includes chest x-ray (EKG).  Shortness of Breath  Associated symptoms include chest pain. Pertinent negatives include no abdominal pain, fever, headaches, leg pain, sputum production, syncope or vomiting.    RN note: Pt states about two weeks ago she started feeling shortness of breath, feeling like her pulse is racing, and dizziness. Pt states the shortness of breath  became worse yesterday. Pt states Monday she felt like a brick was sitting on her chest. Pt sent over from MD office today for evaluation. Pt states baby is moving normally. Pt states she has some irregular braxton-hicks contractions. Pt denies bleeding and leaking of fluid.    Electronically signed by Mellody Dance, RN at 10/05/2016 4:42 PM      Past Medical History: No past medical history on file.  Past obstetric history: OB History  Gravida Para Term Preterm AB Living  6 3 2 1 2 3   SAB TAB Ectopic Multiple Live Births  2       3    # Outcome Date GA Lbr Len/2nd Weight Sex Delivery Anes PTL Lv  6 Current           5 Term 02/12/04 [redacted]w[redacted]d    CS-LTranv     4 Preterm 05/05/02 [redacted]w[redacted]d    CS-LTranv        Complications: Placenta Previa  3 Term 06/19/00     Vag-Spont  N LIV  2 SAB           1 SAB               Past Surgical History: No past surgical history on file.  Family History: No family history on file.  Social History: Social History  Substance Use Topics  . Smoking status: Not on file  . Smokeless tobacco: Not on file  . Alcohol use Not on file    Allergies:  Allergies  Allergen Reactions  . Erythromycin     As a child, unknown reaction  . Morphine And Related Itching and Other (See Comments)    hallucinations  .  Sulfa Antibiotics Hives    Meds:  No prescriptions prior to admission.    I have reviewed patient's Past Medical Hx, Surgical Hx, Family Hx, Social Hx, medications and allergies.   ROS:  Review of Systems  Constitutional: Negative for diaphoresis, fever and malaise/fatigue.  Respiratory: Positive for shortness of breath. Negative for cough and sputum production.   Cardiovascular: Positive for chest pain, palpitations and near-syncope. Negative for syncope.  Gastrointestinal: Negative for abdominal pain, nausea and vomiting.  Musculoskeletal: Negative for back pain.  Neurological: Positive for dizziness. Negative for weakness, numbness and  headaches.   Other systems negative  Physical Exam  Patient Vitals for the past 24 hrs:  BP Temp Temp src Pulse Resp SpO2 Height Weight  10/05/16 1642 - 99.1 F (37.3 C) Oral 96 18 100 % 5' 3.5" (1.613 m) 157 lb (71.2 kg)  10/05/16 1636 (!) 132/104 - - 89 - 98 % - -  10/05/16 1635 114/85 - - (!) 120 - 98 % - -   Vitals:   10/05/16 1716 10/05/16 1723 10/05/16 1741 10/05/16 1746  BP: 120/75  117/81 113/79  Pulse: 81  (!) 108 (!) 107  Resp:      Temp:      TempSrc:      SpO2:  100%    Weight:      Height:        Constitutional: Well-developed, well-nourished female in no acute distress, but intermittently uncomfortable.  Cardiovascular: normal rate and rhythm with episodes of variable rate. S1 and S2 audible, ?click  Respiratory: normal effort, clear to auscultation bilaterally GI: Abd soft, non-tender, gravid appropriate for gestational age.   No rebound or guarding. MS: Extremities nontender, no edema, normal ROM Neurologic: Alert and oriented x 4. DTRs brisk but there is no clonus GU: Neg CVAT.  PELVIC EXAM: deferred    FHT:  Baseline 130 , moderate variability, accelerations present, no decelerations, except for two questionable 30-second variables when patient was having episode.  Nadir to 120 Contractions:  Irregular  Rare   Labs: Results for orders placed or performed during the hospital encounter of 10/05/16 (from the past 24 hour(s))  CBC     Status: Abnormal   Collection Time: 10/05/16  5:16 PM  Result Value Ref Range   WBC 11.5 (H) 4.0 - 10.5 K/uL   RBC 3.49 (L) 3.87 - 5.11 MIL/uL   Hemoglobin 11.7 (L) 12.0 - 15.0 g/dL   HCT 16.1 (L) 09.6 - 04.5 %   MCV 94.8 78.0 - 100.0 fL   MCH 33.5 26.0 - 34.0 pg   MCHC 35.3 30.0 - 36.0 g/dL   RDW 40.9 81.1 - 91.4 %   Platelets 222 150 - 400 K/uL   CMET and Troponin pending Protein/Creat ratio pending    Imaging:  Chest Xray EKG  MAU Course/MDM: I have ordered labs and reviewed results. Other results  pending NST reviewed and found to be reactive and reassuring, Category I Consult DrHorvath with presentation, exam findings and test results.  Consulted DR Mayford Knife to read EKG.  Discussed case with her and she recommends transfer to [facility] ED  Assessment: 1. Chest pain     Plan: Transfer to [facility] ED   Wynelle Bourgeois CNM, MSN Certified Nurse-Midwife 10/05/2016 4:55 PM

## 2016-10-05 NOTE — Consult Note (Signed)
Cardiology Consultation:   Patient ID: [name] [name]; [id number]; Jul 06, 1975   Admit date: 10/05/2016 Date of Consult: 10/05/2016  Primary Care Provider: Patient, No Pcp Per Primary Cardiologist: [name] [name]) Primary Electrophysiologist:  none   Patient Profile:   [name] [name] is a 41 y.o. female with a hx of hypertension and G6 P3 who is being seen today for the evaluation of abnormal heart beat at the request of Dr. Rhunette [name].  History of Present Illness:   [name] [name] is 35 weeks into her pregnancy G6P3 with hx of hypertension and palpitations. She says that she has had an abnormal heart beat which was initially investigated as a child and teen with "many" Holter monitors but only PVCs and no significant abnormalities have been found. Around five months into her pregnancy, she started to develop palpitations and symptoms of chest discomfort and shortness of breath. In the past two weeks the symptoms have become much more severe especially these past few days. She reports being unable to stand up and walk without feeling as though she will pass out and severe SOB. She does describe this as intermittent. She reports improvement with the abnormal beat in her chest when she coughs. Often the abnormal heart beat makes her cough involuntarily.  She says that she has waves of sudden onset severe SOB that is independent of positions she is in and can occur with exertion or at rest.  She has been trying to lay on her left side as much as possible but this does not help.  She denies any LE edema.  She has been having intermittent severe pressure on her chest for a few days that has also been intermittent with associated numbness and tingling in her arms and occasionally tingling in her legs.  She went in for follow-up visit today at her OB and let them know about her symptoms and they referred her to [facility] ER.  On arrival there  they did an EKG which initially showed NSR with no ST changes but a second EKG  showed an intial increase in her heart rate with nonspecific T wave changes in the inferior leads and then an episode of second degree AV block Mobitz type I for 1 skipped beat at which time she felt very lightheaded and said she was "seeing stars".   She takes only vitamins and Zantac at home. She does not have a cardiologist. She has not experienced syncope. So far she has had an uncomplicated pregnancy.  Past Medical History:  Diagnosis Date  . Heart murmur    as a child  . Tachycardia    as a child    Past Surgical History:  Procedure Laterality Date  . ABDOMINOPLASTY    . BREAST ENHANCEMENT SURGERY    . CESAREAN SECTION    . KIDNEY SURGERY    . MANDIBLE SURGERY    . TONSILLECTOMY       Inpatient Medications: Scheduled Meds:  Continuous Infusions:  PRN Meds:   Allergies:    Allergies  Allergen Reactions  . Sulfa Antibiotics Anaphylaxis and Hives  . Erythromycin     As a child, unknown reaction  . Morphine And Related Itching and Other (See Comments)    hallucinations    Social History:   Social History   Social History  . Marital status: Divorced    Spouse name: N/A  . Number of children: N/A  . Years of education: N/A   Occupational History  . Not on file.  Social History Main Topics  . Smoking status: Never Smoker  . Smokeless tobacco: Never Used  . Alcohol use No  . Drug use: No  . Sexual activity: Yes    Birth control/ protection: None   Other Topics Concern  . Not on file   Social History Narrative  . No narrative on file    Family History:   The patient's family history includes Cancer in her paternal aunt and paternal grandfather; Diabetes in her mother; Hyperlipidemia in her father; Hypertension in her father.  ROS:  Please see the history of present illness.  All other ROS reviewed and negative.     Physical Exam/Data:   Vitals:   10/05/16 1915 10/05/16 1930 10/05/16 1945 10/05/16 2000  BP: 117/87 104/79 108/63 108/82  Pulse:  78 85 82 84  Resp: (!) 21 15 15 20   Temp: 98.8 F (37.1 C)     TempSrc: Oral     SpO2: 100% 100% 100% 100%  Weight:      Height:       No intake or output data in the 24 hours ending 10/05/16 2009 Filed Weights   10/05/16 1642  Weight: 157 lb (71.2 kg)   Body mass index is 27.38 kg/m.  General: Well developed, well nourished, in no acute distress. Head: Normocephalic, atraumatic, sclera non-icteric, no xanthomas, nares are without discharge.  Neck: Negative for carotid bruits. JVD not elevated. Lungs: Clear bilaterally to auscultation without wheezes, rales, or rhonchi. Breathing is unlabored. Heart:  RRR with S1 S2. No murmurs, rubs, or gallops appreciated. Abdomen: + Gravid and non tender Msk:  Strength and tone appear normal for age. Extremities: No clubbing or cyanosis. No edema.  Distal pedal pulses are 2+ and equal bilaterally. Neuro: Alert and oriented X 3. No facial asymmetry. No focal deficit. Moves all extremities spontaneously. Psych:  Responds to questions appropriately with a normal affect.  EKG:  The EKG first was personally reviewed and demonstrates NSR with no ST changes.  Her second EKG showed NSR with increase in heart rate with nonspecific T wave changes in the inferior leads and second degree heart block Mobitz type I for 1 beat.  Relevant CV Studies: None  Laboratory Data:  Chemistry Recent Labs Lab 10/05/16 1716  NA 134*  K 3.9  CL 106  CO2 20*  GLUCOSE 81  BUN 8  CREATININE 0.85  CALCIUM 8.9  GFRNONAA >60  GFRAA >60  ANIONGAP 8     Recent Labs Lab 10/05/16 1716  PROT 6.7  ALBUMIN 3.3*  AST 21  ALT 14  ALKPHOS 106  BILITOT 0.5   Hematology Recent Labs Lab 10/05/16 1716  WBC 11.5*  RBC 3.49*  HGB 11.7*  HCT 33.1*  MCV 94.8  MCH 33.5  MCHC 35.3  RDW 13.2  PLT 222   Cardiac Enzymes Recent Labs Lab 10/05/16 1716  TROPONINI <0.03   No results for input(s): TROPIPOC in the last 168 hours.  BNPNo results for input(s):  BNP, PROBNP in the last 168 hours.  DDimer No results for input(s): DDIMER in the last 168 hours.  Radiology/Studies:  Dg Chest 2 View  Result Date: 10/05/2016 CLINICAL DATA:  Chest pain for 2 weeks.  Third-trimester pregnancy. EXAM: CHEST  2 VIEW COMPARISON:  None. FINDINGS: The heart size and mediastinal contours are within normal limits. Both lungs are clear. The visualized skeletal structures are unremarkable. IMPRESSION: No active cardiopulmonary disease. Electronically Signed   By: Gaylyn Rong M.D.   On:  10/05/2016 17:58    Assessment and Plan:   1. Irregular heart beat: EKG shows NSR with occasional speeding up of the heart beat with associated nonspecific T wave changes at a faster HR with a 2nd AV block Mobitz type I transiently which she becomes symptomatic with.  -- she does not have any cardiac medications on her list.  I suspect she is just having intermittent type I wenkebach block ? Secondary to vagal response to the uterine contractions she is having.  She has been having increased uterine contractions for several weeks that have become more frequent and intense in the past few days.   -- initial trop is normal.  Her EKG does have some T wave changes when her HR gets faster but no ST elevation. Doubt this is SCAD.  Will cycle trop. -- obtain stat echo to assess for wall motion abnormalities.   -- Our service will Admit to telemetry for 24 hour obs to see if she has any other arrhythmias on tele. If no changes present then will send home on event monitor.  2. [redacted] weeks pregnant: She is a risk pregnancy due to age.  -- discussed with her OB on call Dr Henderson Cloud.  The nurse and providers are to  call rapid response nurse as needed for any concerns.  She will come over and do an initial assessment on the monitor and then monitor remotely from [facility].   3.  SOB ? Etiology -- likely related to expanding uterus -- this also could be anxiety related.  She describes having tingling in  her arms and legs with dizziness which may be related to hyperventilation.   --2D echo pending  4.  Chest pain that is atypical and unrelated to exertion.  ? Whether she is having worsening GERD.  It is midsternal with no radiation.  She is on a PPI.   Suan Halter, PA-C  10/05/2016 8:09 PM

## 2016-10-05 NOTE — ED Provider Notes (Signed)
[facility]-EM[facility]GENCY DEPT Provider Note   CSN: [id number] Arrival date & time: 10/05/16  1619     History   Chief Complaint Chief Complaint  Patient presents with  . Chest Pain  . Shortness of Breath    HPI [name] [name] is a 41 y.o. female.  The history is provided by the patient and medical records. No language interpreter was used.  Shortness of Breath  This is a recurrent problem. The average episode lasts 1 day. The problem occurs frequently.The current episode started yesterday. The problem has been resolved. Associated symptoms include chest pain. Pertinent negatives include no fever, no sore throat, no ear pain, no cough, no vomiting, no abdominal pain and no rash. She has tried nothing for the symptoms. The treatment provided no relief.    Past Medical History:  Diagnosis Date  . Heart murmur    as a child  . Tachycardia    as a child    Patient Active Problem List   Diagnosis Date Noted  . Pregnancy 10/06/2016  . Heart palpitations 10/05/2016  . Chest pain   . Shortness of breath at rest   . Second degree atrioventricular block, Mobitz (type) I   . ANXIETY 07/19/2007  . HYPERTENSION 07/19/2007  . IBS 07/19/2007  . 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 HEADACHE, CHRONIC 07/19/2007    Past Surgical History:  Procedure Laterality Date  . ABDOMINOPLASTY    . BREAST ENHANCEMENT SURGERY    . CESAREAN SECTION    . KIDNEY SURGERY    . MANDIBLE SURGERY    . TONSILLECTOMY      OB History    Gravida Para Term Preterm AB Living   6 3 2 1 2 3    SAB TAB Ectopic Multiple Live Births   2       3       Home Medications    Prior to Admission medications   Medication Sig Start Date End Date Taking? Authorizing Provider  calcium carbonate (TUMS - DOSED IN MG ELEMENTAL CALCIUM) 500 MG chewable tablet Chew 2 tablets by mouth 2 (two) times daily as needed for indigestion or heartburn.   Yes [provider]  Prenatal Vit-Fe Fumarate-FA  (PRENATAL MULTIVITAMIN) TABS tablet Take 1 tablet by mouth daily at 12 noon.   Yes [provider]  ranitidine (ZANTAC) 150 MG tablet Take 150 mg by mouth 2 (two) times daily.   Yes [provider]    Family History Family History  Problem Relation Age of Onset  . Diabetes Mother   . Hyperlipidemia Father   . Hypertension Father   . Cancer Paternal Aunt   . Cancer Paternal Grandfather     Social History Social History  Substance Use Topics  . Smoking status: Never Smoker  . Smokeless tobacco: Never Used  . Alcohol use No     Allergies   Sulfa antibiotics; Erythromycin; and Morphine and related   Review of Systems Review of Systems  Constitutional: Positive for fatigue. Negative for chills and fever.  HENT: Negative for ear pain and sore throat.   Eyes: Negative for pain and visual disturbance.  Respiratory: Positive for shortness of breath. Negative for cough.   Cardiovascular: Positive for chest pain. Negative for palpitations.  Gastrointestinal: Negative for abdominal pain and vomiting.  Genitourinary: Negative for dysuria and hematuria.  Musculoskeletal: Negative for arthralgias and back pain.  Skin: Negative for color change and rash.  Neurological: Negative for seizures and syncope.  All  other systems reviewed and are negative.    Physical Exam Updated Vital Signs BP 94/63 Comment: asked patient to sit up to retake  Pulse 86   Temp 98.9 F (37.2 C) (Oral)   Resp (!) 22   Ht 5' 3.5" (1.613 m)   Wt 75.9 kg (167 lb 4.8 oz)   LMP  (Exact Date)   SpO2 99%   BMI 29.17 kg/m   Physical Exam  Constitutional: She appears well-developed. No distress.  HENT:  Head: Normocephalic and atraumatic.  Eyes: Conjunctivae are normal.  Neck: Neck supple.  Cardiovascular: Normal rate and regular rhythm.   No murmur heard. Pulmonary/Chest: Effort normal and breath sounds normal. No respiratory distress.  Abdominal: Soft. There is no tenderness.    Gravid apperance  Musculoskeletal: She exhibits no edema.  Neurological: She is alert. No cranial nerve deficit. Coordination normal.  5/5 motor strength and intact sensation in all extremities. Finger-to-nose intact bilaterally  Skin: Skin is warm and dry.  Nursing note and vitals reviewed.    ED Treatments / Results  Labs (all labs ordered are listed, but only abnormal results are displayed) Labs Reviewed  CBC - Abnormal; Notable for the following:       Result Value   WBC 11.5 (*)    RBC 3.49 (*)    Hemoglobin 11.7 (*)    HCT 33.1 (*)    All other components within normal limits  COMPREHENSIVE METABOLIC PANEL - Abnormal; Notable for the following:    Sodium 134 (*)    CO2 20 (*)    Albumin 3.3 (*)    All other components within normal limits  MAGNESIUM - Abnormal; Notable for the following:    Magnesium 1.5 (*)    All other components within normal limits  BASIC METABOLIC PANEL - Abnormal; Notable for the following:    CO2 21 (*)    BUN <5 (*)    Calcium 8.3 (*)    All other components within normal limits  PROTEIN / CREATININE RATIO, URINE  TROPONIN I  TROPONIN I  TROPONIN I  TROPONIN I  CALCIUM, IONIZED    EKG  EKG Interpretation  Date/Time:  Thursday October 05 2016 19:12:44 EDT Ventricular Rate:  77 PR Interval:    QRS Duration: 94 QT Interval:  409 QTC Calculation: 463 R Axis:   76 Text Interpretation:  Sinus rhythm No acute changes normal intervals Confirmed by Derwood Kaplan ([id number]) on 10/05/2016 7:52:15 PM       Radiology Dg Chest 2 View  Result Date: 10/05/2016 CLINICAL DATA:  Chest pain for 2 weeks.  Third-trimester pregnancy. EXAM: CHEST  2 VIEW COMPARISON:  None. FINDINGS: The heart size and mediastinal contours are within normal limits. Both lungs are clear. The visualized skeletal structures are unremarkable. IMPRESSION: No active cardiopulmonary disease. Electronically Signed   By: Gaylyn Rong M.D.   On: 10/05/2016 17:58     Procedures Procedures (including critical care time)  Medications Ordered in ED Medications  lactated ringers bolus 1,000 mL (0 mLs Intravenous Stopped 10/05/16 2330)  butorphanol (STADOL) injection 1 mg (1 mg Intravenous Given 10/05/16 2213)  famotidine (PEPCID) IVPB 20 mg premix (0 mg Intravenous Stopped 10/05/16 2343)     Initial Impression / Assessment and Plan / ED Course  I have reviewed the triage vital signs and the nursing notes.  Pertinent labs & imaging results that were available during my care of the patient were reviewed by me and considered in my medical decision making (  see chart for details).     41 yo G6 P3 currently [redacted] weeks gestation who presents from [facility] clinic for recurrent episodes of SOB, chest pain, and diffuse weakness. Intermittent over past several weeks. Yesterday constant SOB, resolved today. Symptoms not typical of previous pregnancies. States she was not eating well yesterday but still drinking fluids. Sometimes feels legs go weak when she stands. Endorses fetal movement. Denies abdominal pain, dysuria, vaginal bleeding or syncope.  AF, VSS. Lungs CTAB.  An episode of Mobitz I captured on EKG. Labs, CXR unremarkable. Pt currently asymptomatic. Doubt PE or ACS. No evidence of HTN or symptoms of pre-eclampsia. Possible mild dehydration. Cardiology evaluated pt at bedside and plan to admit for observation.  Final Clinical Impressions(s) / ED Diagnoses   Final diagnoses:  Irregular heart beat  Shortness of breath at rest  Labile blood pressure    New Prescriptions Discharge Medication List as of 10/06/2016  3:28 PM       Hebert Soho, MD 10/06/16 2223    Derwood Kaplan, MD 10/08/16 1523

## 2016-10-05 NOTE — ED Notes (Signed)
Attempted to call report

## 2016-10-05 NOTE — Progress Notes (Signed)
Report given to [facility] RN in the [facility] ED.

## 2016-10-05 NOTE — Progress Notes (Signed)
[name] [name], RROB notified of pt's transfer to [facility] ED and need for fetal monitoring.

## 2016-10-05 NOTE — Progress Notes (Signed)
  Echocardiogram 2D Echocardiogram has been performed.  Monque Haggar 10/05/2016, 10:22 PM

## 2016-10-05 NOTE — MAU Note (Signed)
Pt states about two weeks ago she started feeling shortness of breath, feeling like her pulse is racing, and dizziness. Pt states the shortness of breath became worse yesterday. Pt states Monday she felt like a brick was sitting on her chest. Pt sent over from MD office today for evaluation. Pt states baby is moving normally. Pt states she has some irregular braxton-hicks contractions. Pt denies bleeding and leaking of fluid.

## 2016-10-06 DIAGNOSIS — Z3490 Encounter for supervision of normal pregnancy, unspecified, unspecified trimester: Secondary | ICD-10-CM

## 2016-10-06 DIAGNOSIS — R002 Palpitations: Secondary | ICD-10-CM | POA: Diagnosis not present

## 2016-10-06 LAB — ECHOCARDIOGRAM COMPLETE
AOASC: 27 cm
CHL CUP DOP CALC LVOT VTI: 19.5 cm
E decel time: 246 msec
EERAT: 5.02
FS: 40 % (ref 28–44)
Height: 63.5 in
IV/PV OW: 0.83
LA ID, A-P, ES: 30 mm
LADIAMINDEX: 1.71 cm/m2
LAVOL: 31.6 mL
LAVOLA4C: 24.4 mL
LAVOLIN: 18.1 mL/m2
LEFT ATRIUM END SYS DIAM: 30 mm
LV PW d: 9.55 mm — AB (ref 0.6–1.1)
LV SIMPSON'S DISK: 75
LV TDI E'MEDIAL: 8.18
LV dias vol index: 58 mL/m2
LV dias vol: 102 mL (ref 46–106)
LVEEAVG: 5.02
LVEEMED: 5.02
LVELAT: 11.9 cm/s
LVOT area: 2.27 cm2
LVOT diameter: 17 mm
LVOT peak vel: 107 cm/s
LVOTSV: 44 mL
LVSYSVOL: 25 mL (ref 14–42)
LVSYSVOLIN: 15 mL/m2
MV Dec: 246
MV pk A vel: 65.8 m/s
MVPKEVEL: 59.7 m/s
RV LATERAL S' VELOCITY: 13.6 cm/s
RV TAPSE: 19.9 mm
Stroke v: 77 ml
TDI e' lateral: 11.9
Weight: 2512 oz

## 2016-10-06 LAB — BASIC METABOLIC PANEL
ANION GAP: 8 (ref 5–15)
BUN: <5 mg/dL — ABNORMAL LOW (ref 6–20)
CALCIUM: 8.3 mg/dL — AB (ref 8.9–10.3)
CO2: 21 mmol/L — ABNORMAL LOW (ref 22–32)
Chloride: 109 mmol/L (ref 101–111)
Creatinine, Ser: 0.77 mg/dL (ref 0.44–1.00)
GLUCOSE: 91 mg/dL (ref 65–99)
POTASSIUM: 3.6 mmol/L (ref 3.5–5.1)
Sodium: 138 mmol/L (ref 135–145)

## 2016-10-06 LAB — TROPONIN I: Troponin I: <0.03 ng/mL (ref <0.03)

## 2016-10-06 LAB — CALCIUM, IONIZED: Calcium, Ionized, Serum: 4.8 mg/dL (ref 4.5–5.6)

## 2016-10-06 NOTE — Plan of Care (Signed)
Problem: Safety: Goal: Ability to remain free from injury will improve Outcome: Progressing Fall risk bundle in place. No injuries this shift.   Problem: Pain Managment: Goal: General experience of comfort will improve Outcome: Progressing No complaints of chest pain, dizziness or other pain. Will continue to monitor and assess pt.

## 2016-10-06 NOTE — Consult Note (Signed)
Cardiology Consultation:   Patient ID: [name] [name]; [id number]; [birth year]   Admit date: 10/05/2016 Date of Consult: 10/06/2016  Primary Care Provider: Patient, No Pcp Per Primary Cardiologist: new Dr. Mayford Knife   Patient Profile:   [name] [name] is a 41 y.o. female with a hx of current pregnancy at 35 weeks with her 7th pregnancy, (G6P3), HTN with her 1st pregnancy only, reports life-long abnormal heart beat wearing a number of holters as a child apparently showing only PVCs, who is being seen today for the evaluation of heart rhythm at the request of Turner.  History of Present Illness:   Ms. Cunning was referred to [facility] by her OB for c/o intermittent severe pressure on her chest for a few days that has also been intermittent with associated numbness and tingling in her arms and occasionally tingling in her legs.  She reported palpitations more noted at about 5 months into her current pregnancy associated with chest discomfort/SOB more so in the last 2 weeks sometimes prompting an involuntary cough, also mentioned when she stand up and walk without feeling as though she Caelin Rayl pass out and severe SOB.  Cardiology was called to evaluate her symptoms and abnormal heart beat who noted an EKG that appeared to be second degree AV block Mobitz type I for 1 skipped beat at which time she felt very lightheaded and said she was "seeing stars"  The patient to me reports that as an adult she has never really noted any palpitations, all prior pregnancies were without any cardiac awareness.  This pregnancy she started feeling contractions at about 6 months, in the last couple weeks more regularly and although none today, the last few days much more so, she reports being 1cm, planned for Csection on the 11th.  Her primary symptom seems to be SOB, tells me of late even talking gets her winded, mentioning the baby is sitting up under her ribs.  She finds minimal exertion is making her very SOB and even  getting up and walking short distances gets her so tired she is getting lightheaded and dizzy, near fainting but hasn't.  She is getting palpitations, she describes more of racing sensation, or observation that her HR (by her watch and feeling her pulse) can be 80 and jump to 130.  The palpitations and SOB do seem to travel together, though SOB usually the first symptom.  She also mentions CP, central and can radiate throughout her chest, says this can be a brick on her chest kind of symptom, other times a more non-de script symptom.  LABS K+ 3.6 Mag 1.5 yesterday BUN/Creat <5/0.77 H/H 11.7/33.1 WBC 11.5 Plts 222  Past Medical History:  Diagnosis Date  . Heart murmur    as a child  . Tachycardia    as a child    Past Surgical History:  Procedure Laterality Date  . ABDOMINOPLASTY    . BREAST ENHANCEMENT SURGERY    . CESAREAN SECTION    . KIDNEY SURGERY    . MANDIBLE SURGERY    . TONSILLECTOMY       Inpatient Medications: Scheduled Meds:  Continuous Infusions:  PRN Meds: acetaminophen, promethazine  Allergies:    Allergies  Allergen Reactions  . Sulfa Antibiotics Anaphylaxis and Hives  . Erythromycin     As a child, unknown reaction  . Morphine And Related Itching and Other (See Comments)    hallucinations    Social History:   Social History   Social History  . Marital  status: Divorced    Spouse name: N/A  . Number of children: N/A  . Years of education: N/A   Occupational History  . Not on file.   Social History Main Topics  . Smoking status: Never Smoker  . Smokeless tobacco: Never Used  . Alcohol use No  . Drug use: No  . Sexual activity: Yes    Birth control/ protection: None   Other Topics Concern  . Not on file   Social History Narrative  . No narrative on file    Family History:   The patient's family history includes Cancer in her paternal aunt and paternal grandfather; Diabetes in her mother; Hyperlipidemia in her father; Hypertension in  her father.  ROS:  Please see the history of present illness.  ROS  All other ROS reviewed and negative.     Physical Exam/Data:   Vitals:   10/05/16 2336 10/06/16 0551 10/06/16 1324 10/06/16 1327  BP: (!) 113/55 108/68 (!) 88/46 (!) 87/50  Pulse:  79 86   Resp: (!) 22     Temp:  98.1 F (36.7 C) 98.9 F (37.2 C)   TempSrc:  Oral Oral   SpO2: 92% 100% 99%   Weight:  167 lb 4.8 oz (75.9 kg)    Height:        Intake/Output Summary (Last 24 hours) at 10/06/16 1329 Last data filed at 10/05/16 2330  Gross per 24 hour  Intake                0 ml  Output              200 ml  Net             -200 ml   Filed Weights   10/05/16 1642 10/05/16 2255 10/06/16 0551  Weight: 157 lb (71.2 kg) 160 lb 8 oz (72.8 kg) 167 lb 4.8 oz (75.9 kg)   Body mass index is 29.17 kg/m.  General:  Well nourished, well developed, in no acute distress HEENT: normal Lymph: no adenopathy Neck: no JVD Endocrine:  No thryomegaly Vascular: No carotid bruits; FA pulses 2+ bilaterally without bruits  Cardiac:  RRR; no murmurs, gallops or rubs are appreciated Lungs:  CTA b/l, no wheezing, rhonchi or rales  Abd: gravid Ext: no edema Musculoskeletal:  No deformities, BUE and BLE strength normal and equal Skin: warm and dry  Neuro:  No gross focal abnormalities noted Psych:  Normal affect   Reviewed by myself EKG:  SR, normal intervals, one EKG does have dropped beat, there is P-P prolongation as well as PR prolongation prior to dropped beat followed by a junctional beat, 1.6 no significant bradycardia  Telemetry: SR, infrequent episodes non-conducted P waves, some appear to be wenckebach with single dropped beat, others without PR prolongation, though rate increases afterwards without significant pause or bradycardic episodes. Longest event is 2.0 seconds.  Once she briefly had developed QRS morphology change, though is SR75bpm, she has periods of ST 120's  Relevant CV Studies: 10/05/16 TTE Study  Conclusions - Left ventricle: Abnormal septal motion. The cavity size was   normal. Systolic function was normal. The estimated ejection   fraction was in the range of 55% to 60%. Wall motion was normal;   there were no regional wall motion abnormalities. Left   ventricular diastolic function parameters were normal.  Laboratory Data:  Chemistry Recent Labs Lab 10/05/16 1716 10/06/16 0832  NA 134* 138  K 3.9 3.6  CL 106 109  CO2 20* 21*  GLUCOSE 81 91  BUN 8 <5*  CREATININE 0.85 0.77  CALCIUM 8.9 8.3*  GFRNONAA >60 >60  GFRAA >60 >60  ANIONGAP 8 8     Recent Labs Lab 10/05/16 1716  PROT 6.7  ALBUMIN 3.3*  AST 21  ALT 14  ALKPHOS 106  BILITOT 0.5   Hematology Recent Labs Lab 10/05/16 1716  WBC 11.5*  RBC 3.49*  HGB 11.7*  HCT 33.1*  MCV 94.8  MCH 33.5  MCHC 35.3  RDW 13.2  PLT 222   Cardiac Enzymes Recent Labs Lab 10/05/16 1716 10/05/16 2037 10/06/16 0213 10/06/16 0832  TROPONINI <0.03 <0.03 <0.03 <0.03   No results for input(s): TROPIPOC in the last 168 hours.  BNPNo results for input(s): BNP, PROBNP in the last 168 hours.  DDimer No results for input(s): DDIMER in the last 168 hours.  Radiology/Studies:  Dg Chest 2 View Result Date: 10/05/2016 CLINICAL DATA:  Chest pain for 2 weeks.  Third-trimester pregnancy. EXAM: CHEST  2 VIEW COMPARISON:  None. FINDINGS: The heart size and mediastinal contours are within normal limits. Both lungs are clear. The visualized skeletal structures are unremarkable. IMPRESSION: No active cardiopulmonary disease. Electronically Signed   By: Gaylyn Rong M.D.   On: 10/05/2016 17:58    Assessment and Plan:   1. CP, palpitations SOB     Hard to attribute telemetry findings to her symptoms     I have not seen clear evidence of high degree AVblock,no arrhythmias, no significant bradycardic episodes.      I do not anticipate any need for EP intervention  Dr. Elberta Fortis to see  General cardiology is on board, SOB,  CP not felt to have cardiac etiology with normal LVEF and negative troponins Suspect expanding uterus and progressing pregnancy as etiology        Signed, Sheilah Pigeon, PA-C  10/06/2016 1:29 PM   I have seen and examined this patient with Francis Dowse.  Agree with above, note added to reflect my findings.  On exam, RRR, no murmurs, lungs clear. Had episodes of SOB and chest pain, found to have dropped beats that appear to be mobitz I AV block. She is currently [redacted] weeks pregnant. At this time, would prefer to avoid any further EP procedures. She has a narrow QRS complex and thus is not at risk of asystole. If she continues to have palpitations post pregnancy, would plan to monitor at that time..    Azrael Huss M. Damarion Mendizabal MD 10/06/2016 3:29 PM

## 2016-10-06 NOTE — Discharge Summary (Signed)
Discharge Summary    Patient ID: [name] [name],  MRN: [id number], DOB/AGE: 10/09/1975 41 y.o.  Admit date: 10/05/2016 Discharge date: 10/06/2016  Primary Care Provider: Patient, No Pcp Per Primary Cardiologist: Dr. Mayford Knife  Discharge Diagnoses    Principal Problem:   Heart palpitations Active Problems:   Second degree atrioventricular block, Mobitz (type) I   Pregnancy   History of Present Illness     [name] [name] is a 42 y.o. G69P3 female with past medical history of HTN who is currently [redacted] weeks pregnant and presented to [facility] on 10/05/2016 for worsening dyspnea and palpitations.    She reports a history of abnormal heart beats which was initially investigated as a child and teen with "many" Holter monitors but only PVC's and no significant abnormalities.   Around five months into her current pregnancy, she started to develop palpitations and symptoms of chest discomfort and shortness of breath. In the past two weeks the symptoms have increased in severity. She reported being unable to stand up and walk without feeling as though she would pass out. Also noted intermittent episodes of severe pressure on her chest for a few days with associated numbness and tingling in her arms and occasionally tingling in her legs.    She went to her OB on 6/14 with her initial EKG showing NSR with no ST changes but a second EKG showed an intial increase in her heart rate with nonspecific T wave changes in the inferior leads and then an episode of second degree AV block Mobitz type I for 1 skipped beat at which time she felt very lightheaded and said she was "seeing stars".     She presented to the MAU for initial evaluation and was accepted to Cardiology at [facility] for further workup.   Hospital Course     Consultants: Electrophysiology  An echocardiogram was performed and showed an EF of 55-60% with no wall motion abnormalities. Cyclic troponin values remained negative, therefore SCAD  was thought to be less likely. No significant valve abnormalities identified as well. A fetal non-stress test was performed overnight and showed a FHR of 115 bpm with appropriate accelerations and no decelerations.   The following morning, she reported still having occasional palpitations and chest discomfort. Her chest discomfort was thought to be secondary to GERD. Telemetry showed 2nd Degree, Type 1 Wenckebach with no evidence of CHB or significant pauses. Electrophysiology was consulted and and had no further recommendations as there was no evidence of high-degree heart block, arrhythmias, or pauses.   She was last examined by Dr. Mayford Knife and deemed stable for discharge. She will follow-up with OB-GYN as scheduled and with Cardiology on an as-needed basis.   _____________  Discharge Vitals Blood pressure 94/63, pulse 86, temperature 98.9 F (37.2 C), temperature source Oral, resp. rate (!) 22, height 5' 3.5" (1.613 m), weight 167 lb 4.8 oz (75.9 kg), SpO2 99 %.  Filed Weights   10/05/16 1642 10/05/16 2255 10/06/16 0551  Weight: 157 lb (71.2 kg) 160 lb 8 oz (72.8 kg) 167 lb 4.8 oz (75.9 kg)    Labs & Radiologic Studies     CBC  Recent Labs  10/05/16 1716  WBC 11.5*  HGB 11.7*  HCT 33.1*  MCV 94.8  PLT 222   Basic Metabolic Panel  Recent Labs  10/05/16 1716 10/05/16 2037 10/06/16 0832  NA 134*  --  138  K 3.9  --  3.6  CL 106  --  109  CO2 20*  --  21*  GLUCOSE 81  --  91  BUN 8  --  <5*  CREATININE 0.85  --  0.77  CALCIUM 8.9  --  8.3*  MG  --  1.5*  --    Liver Function Tests  Recent Labs  10/05/16 1716  AST 21  ALT 14  ALKPHOS 106  BILITOT 0.5  PROT 6.7  ALBUMIN 3.3*   No results for input(s): LIPASE, AMYLASE in the last 72 hours. Cardiac Enzymes  Recent Labs  10/05/16 2037 10/06/16 0213 10/06/16 0832  TROPONINI <0.03 <0.03 <0.03   BNP Invalid input(s): POCBNP D-Dimer No results for input(s): DDIMER in the last 72 hours. Hemoglobin A1C No  results for input(s): HGBA1C in the last 72 hours. Fasting Lipid Panel No results for input(s): CHOL, HDL, LDLCALC, TRIG, CHOLHDL, LDLDIRECT in the last 72 hours. Thyroid Function Tests No results for input(s): TSH, T4TOTAL, T3FREE, THYROIDAB in the last 72 hours.  Invalid input(s): FREET3  Dg Chest 2 View  Result Date: 10/05/2016 CLINICAL DATA:  Chest pain for 2 weeks.  Third-trimester pregnancy. EXAM: CHEST  2 VIEW COMPARISON:  None. FINDINGS: The heart size and mediastinal contours are within normal limits. Both lungs are clear. The visualized skeletal structures are unremarkable. IMPRESSION: No active cardiopulmonary disease. Electronically Signed   By: Gaylyn Rong M.D.   On: 10/05/2016 17:58     Diagnostic Studies/Procedures    Echocardiogram: 10/05/2016 Study Conclusions  - Left ventricle: Abnormal septal motion. The cavity size was   normal. Systolic function was normal. The estimated ejection   fraction was in the range of 55% to 60%. Wall motion was normal;   there were no regional wall motion abnormalities. Left   ventricular diastolic function parameters were normal.  Disposition   Pt is being discharged home today in good condition.  Follow-up Plans & Appointments    Follow-up Information    Quintella Reichert, MD Follow up.   Specialty:  Cardiology Why:  Follow-Up with Cardiology as needed.  Contact information: [address] [place] [address] [phone]            Discharge Medications     Medication List    TAKE these medications   calcium carbonate 500 MG chewable tablet Commonly known as:  TUMS - dosed in mg elemental calcium Chew 2 tablets by mouth 2 (two) times daily as needed for indigestion or heartburn.   prenatal multivitamin Tabs tablet Take 1 tablet by mouth daily at 12 noon.   ranitidine 150 MG tablet Commonly known as:  ZANTAC Take 150 mg by mouth 2 (two) times daily.         Allergies Allergies    Allergen Reactions  . Sulfa Antibiotics Anaphylaxis and Hives  . Erythromycin     As a child, unknown reaction  . Morphine And Related Itching and Other (See Comments)    hallucinations     Outstanding Labs/Studies   None  Duration of Discharge Encounter   Greater than 30 minutes including physician time.  Signed, Ellsworth Lennox, PA-C 10/06/2016, 3:28 PM

## 2016-10-06 NOTE — Progress Notes (Signed)
Non-stress test. FHR 115bpm, three 15x15 accelerations, no decelerations. Uterine irritability. Patient states does not feel many contractions at this time, able to sleep.   Alcide Goodness  OB Rapid Response  [phone]

## 2016-10-06 NOTE — Progress Notes (Signed)
Progress Note  Patient Name: [name] [name] Date of Encounter: 10/06/2016  Primary Cardiologist: Dr. Mayford Knife  Subjective   Reports worsening fatigue. Still having occasional palpitations and chest discomfort.   Inpatient Medications    Scheduled Meds:  Continuous Infusions:  PRN Meds: acetaminophen, promethazine   Vital Signs    Vitals:   10/05/16 2255 10/05/16 2256 10/05/16 2336 10/06/16 0551  BP:  125/78 (!) 113/55 108/68  Pulse:  (!) 114  79  Resp:   (!) 22   Temp:  97.8 F (36.6 C)  98.1 F (36.7 C)  TempSrc:  Oral  Oral  SpO2:  100% 92% 100%  Weight: 160 lb 8 oz (72.8 kg)   167 lb 4.8 oz (75.9 kg)  Height: 5' 3.5" (1.613 m)       Intake/Output Summary (Last 24 hours) at 10/06/16 0848 Last data filed at 10/05/16 2330  Gross per 24 hour  Intake                0 ml  Output              200 ml  Net             -200 ml   Filed Weights   10/05/16 1642 10/05/16 2255 10/06/16 0551  Weight: 157 lb (71.2 kg) 160 lb 8 oz (72.8 kg) 167 lb 4.8 oz (75.9 kg)    Telemetry    NSR, HR in 70's - 80's. Episodes of 2nd Degree Type 1 - Personally Reviewed  ECG    NSR, HR 88, with isolated TWI in Lead III. - Personally Reviewed  Physical Exam   General: Well developed, well nourished, pregnant female appearing in no acute distress. Head: Normocephalic, atraumatic.  Neck: Supple without bruits, JVD not elevated. Lungs:  Resp regular and unlabored, CTA without wheezing or rales. Heart: RRR, S1, S2, no S3, S4, or murmur; no rub. Abdomen: Soft, non-tender, gravid, with normoactive bowel sounds. No hepatomegaly. No rebound/guarding. No obvious abdominal masses. Extremities: No clubbing, cyanosis, or lower extremity edema. Distal pedal pulses are 2+ bilaterally. Neuro: Alert and oriented X 3. Moves all extremities spontaneously. Psych: Normal affect.  Labs    Chemistry Recent Labs Lab 10/05/16 1716  NA 134*  K 3.9  CL 106  CO2 20*  GLUCOSE 81  BUN 8  CREATININE  0.85  CALCIUM 8.9  PROT 6.7  ALBUMIN 3.3*  AST 21  ALT 14  ALKPHOS 106  BILITOT 0.5  GFRNONAA >60  GFRAA >60  ANIONGAP 8     Hematology Recent Labs Lab 10/05/16 1716  WBC 11.5*  RBC 3.49*  HGB 11.7*  HCT 33.1*  MCV 94.8  MCH 33.5  MCHC 35.3  RDW 13.2  PLT 222    Cardiac Enzymes Recent Labs Lab 10/05/16 1716 10/05/16 2037 10/06/16 0213  TROPONINI <0.03 <0.03 <0.03   No results for input(s): TROPIPOC in the last 168 hours.   BNPNo results for input(s): BNP, PROBNP in the last 168 hours.   DDimer No results for input(s): DDIMER in the last 168 hours.   Radiology    Dg Chest 2 View  Result Date: 10/05/2016 CLINICAL DATA:  Chest pain for 2 weeks.  Third-trimester pregnancy. EXAM: CHEST  2 VIEW COMPARISON:  None. FINDINGS: The heart size and mediastinal contours are within normal limits. Both lungs are clear. The visualized skeletal structures are unremarkable. IMPRESSION: No active cardiopulmonary disease. Electronically Signed   By: Gaylyn Rong M.D.   On:  10/05/2016 17:58    Cardiac Studies   Echocardiogram: 10/05/2016 Study Conclusions  - Left ventricle: Abnormal septal motion. The cavity size was   normal. Systolic function was normal. The estimated ejection   fraction was in the range of 55% to 60%. Wall motion was normal;   there were no regional wall motion abnormalities. Left   ventricular diastolic function parameters were normal.  Patient Profile     41 y.o. female with PMH of HTN and G6P3, with is currently [redacted] weeks pregnant and was admitted for an irregular HB with associated presyncope.   Assessment & Plan    1. Irregular heart beat: - the patient has been noted to have intermittent Type I Wenkebach on EKG and telemetry overnight which is possibly secondary to vagal response in the setting of uterine contractions. - troponin values have been negative and echo shows a preserved EF of 55-60% with no wall motion abnormalities.  - she  remains symptomatic with this and still reports lightheadedness and presyncope. Discussed with Dr. Mayford Knife, EP to see later today.   2. Pregnancy - currently 35 weeks  - her OB on-call Dr Henderson Cloud is aware of her current admission.  - being monitored remotely from [facility].   3. SOB  -  likely related to expanding uterus in the setting of pregnancy.  - echo shows a preserved EF of 55-60% with no significant valve abnormalities.   Signed, Ellsworth Lennox , PA-C 8:48 AM 10/06/2016 Pager: [phone]

## 2016-10-10 ENCOUNTER — Ambulatory Visit: Payer: Self-pay | Admitting: Physician Assistant

## 2016-10-17 ENCOUNTER — Encounter (HOSPITAL_COMMUNITY): Payer: Self-pay

## 2016-10-31 ENCOUNTER — Other Ambulatory Visit: Payer: Self-pay | Admitting: Obstetrics and Gynecology

## 2016-10-31 ENCOUNTER — Inpatient Hospital Stay (HOSPITAL_COMMUNITY)
Admission: AD | Admit: 2016-10-31 | Discharge: 2016-11-03 | DRG: 766 | Disposition: A | Discharge status: Home / Self Care | Payer: 59 | Source: Ambulatory Visit | Attending: Obstetrics and Gynecology | Admitting: Obstetrics and Gynecology

## 2016-10-31 ENCOUNTER — Encounter (HOSPITAL_COMMUNITY)
Admission: RE | Admit: 2016-10-31 | Discharge: 2016-10-31 | Disposition: A | Discharge status: Home / Self Care | Payer: 59 | Source: Ambulatory Visit | Attending: Obstetrics and Gynecology | Admitting: Obstetrics and Gynecology

## 2016-10-31 ENCOUNTER — Encounter (HOSPITAL_COMMUNITY): Payer: Self-pay | Admitting: *Deleted

## 2016-10-31 DIAGNOSIS — Z3A39 39 weeks gestation of pregnancy: Secondary | ICD-10-CM

## 2016-10-31 DIAGNOSIS — O34211 Maternal care for low transverse scar from previous cesarean delivery: Principal | ICD-10-CM | POA: Diagnosis present

## 2016-10-31 DIAGNOSIS — D649 Anemia, unspecified: Secondary | ICD-10-CM | POA: Diagnosis present

## 2016-10-31 DIAGNOSIS — O9902 Anemia complicating childbirth: Secondary | ICD-10-CM | POA: Diagnosis present

## 2016-10-31 DIAGNOSIS — Z3490 Encounter for supervision of normal pregnancy, unspecified, unspecified trimester: Secondary | ICD-10-CM

## 2016-10-31 HISTORY — DX: Supervision of elderly multigravida, unspecified trimester: O09.529

## 2016-10-31 LAB — CBC
HEMATOCRIT: 33.8 % — AB (ref 36.0–46.0)
Hemoglobin: 11.5 g/dL — ABNORMAL LOW (ref 12.0–15.0)
MCH: 32.4 pg (ref 26.0–34.0)
MCHC: 34 g/dL (ref 30.0–36.0)
MCV: 95.2 fL (ref 78.0–100.0)
PLATELETS: 221 10*3/uL (ref 150–400)
RBC: 3.55 MIL/uL — ABNORMAL LOW (ref 3.87–5.11)
RDW: 13.4 % (ref 11.5–15.5)
WBC: 9.8 10*3/uL (ref 4.0–10.5)

## 2016-10-31 LAB — TYPE AND SCREEN
ABO/RH(D): A POS
ANTIBODY SCREEN: NEGATIVE

## 2016-10-31 LAB — ABO/RH: ABO/RH(D): A POS

## 2016-10-31 NOTE — Patient Instructions (Signed)
20 [name] [name]  10/31/2016   Your procedure is scheduled on:  11/01/2016  Enter through the Main Entrance of [facility] at 0930 AM.  Pick up the phone at the desk and dial [phone].   Call this number if you have problems the morning of surgery: [phone]   Remember:   Do not eat food:After Midnight.  Do not drink clear liquids: After Midnight.  Take these medicines the morning of surgery with A SIP OF WATER: none   Do not wear jewelry, make-up or nail polish.  Do not wear lotions, powders, or perfumes. Do not wear deodorant.  Do not shave 48 hours prior to surgery.  Do not bring valuables to the hospital.  [facility] is not   responsible for any belongings or valuables brought to the hospital.  Contacts, dentures or bridgework may not be worn into surgery.  Leave suitcase in the car. After surgery it may be brought to your room.  For patients admitted to the hospital, checkout time is 11:00 AM the day of              discharge.   Patients discharged the day of surgery will not be allowed to drive             home.  Name and phone number of your driver: na  Special Instructions:   N/A   Please read over the following fact sheets that you were given:   Surgical Site Infection Prevention

## 2016-10-31 NOTE — MAU Note (Signed)
Water broke at 2245 contractions started just before and have progressed since then.  Denies VB.

## 2016-11-01 ENCOUNTER — Inpatient Hospital Stay (HOSPITAL_COMMUNITY): Payer: 59 | Admitting: Anesthesiology

## 2016-11-01 ENCOUNTER — Inpatient Hospital Stay (HOSPITAL_COMMUNITY): Admission: RE | Admit: 2016-11-01 | Payer: 59 | Source: Ambulatory Visit | Admitting: Obstetrics and Gynecology

## 2016-11-01 ENCOUNTER — Encounter (HOSPITAL_COMMUNITY)
Admission: AD | Disposition: A | Discharge status: Home / Self Care | Payer: Self-pay | Source: Ambulatory Visit | Attending: Obstetrics and Gynecology

## 2016-11-01 ENCOUNTER — Encounter (HOSPITAL_COMMUNITY): Payer: Self-pay | Admitting: Anesthesiology

## 2016-11-01 DIAGNOSIS — O34211 Maternal care for low transverse scar from previous cesarean delivery: Secondary | ICD-10-CM | POA: Diagnosis present

## 2016-11-01 DIAGNOSIS — D649 Anemia, unspecified: Secondary | ICD-10-CM | POA: Diagnosis present

## 2016-11-01 DIAGNOSIS — O9902 Anemia complicating childbirth: Secondary | ICD-10-CM | POA: Diagnosis present

## 2016-11-01 DIAGNOSIS — Z3A39 39 weeks gestation of pregnancy: Secondary | ICD-10-CM | POA: Diagnosis not present

## 2016-11-01 LAB — CBC
HEMATOCRIT: 27.5 % — AB (ref 36.0–46.0)
HEMOGLOBIN: 9.4 g/dL — AB (ref 12.0–15.0)
MCH: 32.5 pg (ref 26.0–34.0)
MCHC: 34.2 g/dL (ref 30.0–36.0)
MCV: 95.2 fL (ref 78.0–100.0)
Platelets: 189 10*3/uL (ref 150–400)
RBC: 2.89 MIL/uL — AB (ref 3.87–5.11)
RDW: 13.3 % (ref 11.5–15.5)
WBC: 15.6 10*3/uL — ABNORMAL HIGH (ref 4.0–10.5)

## 2016-11-01 LAB — RPR: RPR: NONREACTIVE

## 2016-11-01 LAB — POCT FERN TEST
POCT FERN TEST: NEGATIVE
POCT Fern Test: POSITIVE

## 2016-11-01 SURGERY — Surgical Case
Anesthesia: Spinal | Laterality: Bilateral

## 2016-11-01 MED ORDER — MORPHINE SULFATE (PF) 0.5 MG/ML IJ SOLN
INTRAMUSCULAR | Status: AC
  Filled 2016-11-01: qty 10

## 2016-11-01 MED ORDER — SIMETHICONE 80 MG PO CHEW
80.0000 mg | CHEWABLE_TABLET | ORAL | Status: DC
  Administered 2016-11-01 – 2016-11-02 (×3): 80 mg via ORAL
  Filled 2016-11-01 (×3): qty 1

## 2016-11-01 MED ORDER — EPHEDRINE SULFATE 50 MG/ML IJ SOLN
INTRAMUSCULAR | Status: DC | PRN
  Administered 2016-11-01 (×2): 5 mg via INTRAVENOUS

## 2016-11-01 MED ORDER — SCOPOLAMINE 1 MG/3DAYS TD PT72
1.0000 | MEDICATED_PATCH | Freq: Once | TRANSDERMAL | Status: DC
  Administered 2016-11-01: 1.5 mg via TRANSDERMAL
  Filled 2016-11-01: qty 1

## 2016-11-01 MED ORDER — CEFAZOLIN SODIUM-DEXTROSE 2-4 GM/100ML-% IV SOLN
INTRAVENOUS | Status: AC
  Filled 2016-11-01: qty 100

## 2016-11-01 MED ORDER — KETOROLAC TROMETHAMINE 30 MG/ML IJ SOLN
30.0000 mg | Freq: Once | INTRAMUSCULAR | Status: AC
  Administered 2016-11-01: 30 mg via INTRAMUSCULAR

## 2016-11-01 MED ORDER — METOCLOPRAMIDE HCL 5 MG/ML IJ SOLN
INTRAMUSCULAR | Status: AC
  Administered 2016-11-01: 10 mg via INTRAVENOUS
  Filled 2016-11-01: qty 2

## 2016-11-01 MED ORDER — BUPIVACAINE IN DEXTROSE 0.75-8.25 % IT SOLN
INTRATHECAL | Status: AC
  Filled 2016-11-01: qty 2

## 2016-11-01 MED ORDER — SODIUM CHLORIDE 0.9 % IR SOLN
Status: DC | PRN
  Administered 2016-11-01: 1000 mL

## 2016-11-01 MED ORDER — COCONUT OIL OIL: 1.0000 "application " | TOPICAL_OIL | Status: DC | PRN

## 2016-11-01 MED ORDER — DIBUCAINE 1 % RE OINT: 1.0000 "application " | TOPICAL_OINTMENT | RECTAL | Status: DC | PRN

## 2016-11-01 MED ORDER — OXYCODONE-ACETAMINOPHEN 5-325 MG PO TABS
1.0000 | ORAL_TABLET | ORAL | Status: DC | PRN
  Administered 2016-11-01 – 2016-11-03 (×5): 1 via ORAL
  Filled 2016-11-01 (×5): qty 1

## 2016-11-01 MED ORDER — PRENATAL MULTIVITAMIN CH
1.0000 | ORAL_TABLET | Freq: Every day | ORAL | Status: DC
  Administered 2016-11-01 – 2016-11-02 (×2): 1 via ORAL
  Filled 2016-11-01 (×2): qty 1

## 2016-11-01 MED ORDER — TETANUS-DIPHTH-ACELL PERTUSSIS 5-2.5-18.5 LF-MCG/0.5 IM SUSP: 0.5000 mL | Freq: Once | INTRAMUSCULAR | Status: DC

## 2016-11-01 MED ORDER — OXYCODONE-ACETAMINOPHEN 5-325 MG PO TABS
2.0000 | ORAL_TABLET | ORAL | Status: DC | PRN
  Administered 2016-11-01: 2 via ORAL

## 2016-11-01 MED ORDER — LACTATED RINGERS IV SOLN: INTRAVENOUS | Status: DC

## 2016-11-01 MED ORDER — BUPIVACAINE IN DEXTROSE 0.75-8.25 % IT SOLN
INTRATHECAL | Status: DC | PRN
  Administered 2016-11-01: 1.8 mL via INTRATHECAL

## 2016-11-01 MED ORDER — LACTATED RINGERS IV SOLN
INTRAVENOUS | Status: DC | PRN
  Administered 2016-11-01: 40 [IU] via INTRAVENOUS

## 2016-11-01 MED ORDER — OXYTOCIN 10 UNIT/ML IJ SOLN
INTRAMUSCULAR | Status: AC
  Filled 2016-11-01: qty 4

## 2016-11-01 MED ORDER — ONDANSETRON HCL 4 MG/2ML IJ SOLN
INTRAMUSCULAR | Status: AC
  Filled 2016-11-01: qty 2

## 2016-11-01 MED ORDER — DEXAMETHASONE SODIUM PHOSPHATE 4 MG/ML IJ SOLN
INTRAMUSCULAR | Status: DC | PRN
  Administered 2016-11-01: 4 mg via INTRAVENOUS

## 2016-11-01 MED ORDER — FENTANYL CITRATE (PF) 100 MCG/2ML IJ SOLN
INTRAMUSCULAR | Status: AC
  Filled 2016-11-01: qty 2

## 2016-11-01 MED ORDER — OXYCODONE-ACETAMINOPHEN 5-325 MG PO TABS
ORAL_TABLET | ORAL | Status: AC
  Filled 2016-11-01: qty 2

## 2016-11-01 MED ORDER — ZOLPIDEM TARTRATE 5 MG PO TABS: 5.0000 mg | ORAL_TABLET | Freq: Every evening | ORAL | Status: DC | PRN

## 2016-11-01 MED ORDER — EPHEDRINE 5 MG/ML INJ
INTRAVENOUS | Status: AC
  Filled 2016-11-01: qty 10

## 2016-11-01 MED ORDER — PHENYLEPHRINE 8 MG IN D5W 100 ML (0.08MG/ML) PREMIX OPTIME
INJECTION | INTRAVENOUS | Status: AC
  Filled 2016-11-01: qty 100

## 2016-11-01 MED ORDER — FERROUS SULFATE 325 (65 FE) MG PO TABS
325.0000 mg | ORAL_TABLET | Freq: Every day | ORAL | Status: DC
  Administered 2016-11-02 – 2016-11-03 (×2): 325 mg via ORAL
  Filled 2016-11-01 (×2): qty 1

## 2016-11-01 MED ORDER — WITCH HAZEL-GLYCERIN EX PADS: 1.0000 "application " | MEDICATED_PAD | CUTANEOUS | Status: DC | PRN

## 2016-11-01 MED ORDER — KETOROLAC TROMETHAMINE 30 MG/ML IJ SOLN
INTRAMUSCULAR | Status: AC
  Administered 2016-11-01: 30 mg via INTRAMUSCULAR
  Filled 2016-11-01: qty 1

## 2016-11-01 MED ORDER — METOCLOPRAMIDE HCL 5 MG/ML IJ SOLN
10.0000 mg | Freq: Once | INTRAMUSCULAR | Status: AC | PRN
  Administered 2016-11-01: 10 mg via INTRAVENOUS

## 2016-11-01 MED ORDER — LACTATED RINGERS IV SOLN
INTRAVENOUS | Status: DC
  Administered 2016-11-01 (×2): via INTRAVENOUS

## 2016-11-01 MED ORDER — PHENYLEPHRINE 40 MCG/ML (10ML) SYRINGE FOR IV PUSH (FOR BLOOD PRESSURE SUPPORT)
PREFILLED_SYRINGE | INTRAVENOUS | Status: AC
  Filled 2016-11-01: qty 20

## 2016-11-01 MED ORDER — IBUPROFEN 600 MG PO TABS
600.0000 mg | ORAL_TABLET | Freq: Four times a day (QID) | ORAL | Status: DC
  Administered 2016-11-01 – 2016-11-03 (×8): 600 mg via ORAL
  Filled 2016-11-01 (×9): qty 1

## 2016-11-01 MED ORDER — MEASLES, MUMPS & RUBELLA VAC ~~LOC~~ INJ
0.5000 mL | INJECTION | Freq: Once | SUBCUTANEOUS | Status: DC
  Filled 2016-11-01: qty 0.5

## 2016-11-01 MED ORDER — PHENYLEPHRINE HCL 10 MG/ML IJ SOLN
INTRAMUSCULAR | Status: DC | PRN
  Administered 2016-11-01 (×2): 80 ug via INTRAVENOUS
  Administered 2016-11-01: 40 ug via INTRAVENOUS
  Administered 2016-11-01: 80 ug via INTRAVENOUS
  Administered 2016-11-01: 40 ug via INTRAVENOUS
  Administered 2016-11-01 (×3): 80 ug via INTRAVENOUS
  Administered 2016-11-01: 40 ug via INTRAVENOUS

## 2016-11-01 MED ORDER — SOD CITRATE-CITRIC ACID 500-334 MG/5ML PO SOLN
30.0000 mL | Freq: Once | ORAL | Status: AC
  Administered 2016-11-01: 30 mL via ORAL
  Filled 2016-11-01: qty 15

## 2016-11-01 MED ORDER — OXYTOCIN 40 UNITS IN LACTATED RINGERS INFUSION - SIMPLE MED: 2.5000 [IU]/h | INTRAVENOUS | Status: AC

## 2016-11-01 MED ORDER — MEPERIDINE HCL 25 MG/ML IJ SOLN
INTRAMUSCULAR | Status: DC | PRN
  Administered 2016-11-01 (×2): 12.5 mg via INTRAVENOUS

## 2016-11-01 MED ORDER — DEXAMETHASONE SODIUM PHOSPHATE 4 MG/ML IJ SOLN
INTRAMUSCULAR | Status: AC
  Filled 2016-11-01: qty 1

## 2016-11-01 MED ORDER — ONDANSETRON HCL 4 MG/2ML IJ SOLN
INTRAMUSCULAR | Status: DC | PRN
  Administered 2016-11-01: 4 mg via INTRAVENOUS

## 2016-11-01 MED ORDER — FENTANYL CITRATE (PF) 100 MCG/2ML IJ SOLN
25.0000 ug | INTRAMUSCULAR | Status: DC | PRN
  Administered 2016-11-01 (×2): 50 ug via INTRAVENOUS

## 2016-11-01 MED ORDER — MEPERIDINE HCL 25 MG/ML IJ SOLN: 6.2500 mg | INTRAMUSCULAR | Status: DC | PRN

## 2016-11-01 MED ORDER — CEFAZOLIN SODIUM-DEXTROSE 2-4 GM/100ML-% IV SOLN
2.0000 g | INTRAVENOUS | Status: AC
  Administered 2016-11-01: 2 g via INTRAVENOUS

## 2016-11-01 MED ORDER — FENTANYL CITRATE (PF) 100 MCG/2ML IJ SOLN
INTRAMUSCULAR | Status: AC
  Administered 2016-11-01: 50 ug via INTRAVENOUS
  Filled 2016-11-01: qty 2

## 2016-11-01 MED ORDER — SENNOSIDES-DOCUSATE SODIUM 8.6-50 MG PO TABS
2.0000 | ORAL_TABLET | ORAL | Status: DC
  Administered 2016-11-01 – 2016-11-02 (×2): 2 via ORAL
  Filled 2016-11-01 (×2): qty 2

## 2016-11-01 MED ORDER — ACETAMINOPHEN 325 MG PO TABS: 650.0000 mg | ORAL_TABLET | ORAL | Status: DC | PRN

## 2016-11-01 SURGICAL SUPPLY — 39 items
ADH SKN CLS APL DERMABOND .7 (GAUZE/BANDAGES/DRESSINGS)
APL SKNCLS STERI-STRIP NONHPOA (GAUZE/BANDAGES/DRESSINGS) ×1
BENZOIN TINCTURE PRP APPL 2/3 (GAUZE/BANDAGES/DRESSINGS) ×2 IMPLANT
CHLORAPREP W/TINT 26ML (MISCELLANEOUS) ×3 IMPLANT
CLAMP CORD UMBIL (MISCELLANEOUS) IMPLANT
CLOSURE STERI STRIP 1/2 X4 (GAUZE/BANDAGES/DRESSINGS) ×2 IMPLANT
CLOTH BEACON ORANGE TIMEOUT ST (SAFETY) ×3 IMPLANT
DERMABOND ADVANCED (GAUZE/BANDAGES/DRESSINGS)
DERMABOND ADVANCED .7 DNX12 (GAUZE/BANDAGES/DRESSINGS) IMPLANT
DRSG OPSITE POSTOP 4X10 (GAUZE/BANDAGES/DRESSINGS) ×3 IMPLANT
ELECT REM PT RETURN 9FT ADLT (ELECTROSURGICAL) ×3
ELECTRODE REM PT RTRN 9FT ADLT (ELECTROSURGICAL) ×1 IMPLANT
EXTRACTOR VACUUM M CUP 4 TUBE (SUCTIONS) IMPLANT
EXTRACTOR VACUUM M CUP 4' TUBE (SUCTIONS)
GLOVE BIO SURGEON STRL SZ7 (GLOVE) ×3 IMPLANT
GLOVE BIOGEL PI IND STRL 7.0 (GLOVE) ×2 IMPLANT
GLOVE BIOGEL PI INDICATOR 7.0 (GLOVE) ×4
GOWN STRL REUS W/TWL LRG LVL3 (GOWN DISPOSABLE) ×6 IMPLANT
KIT ABG SYR 3ML LUER SLIP (SYRINGE) IMPLANT
NDL HYPO 25X5/8 SAFETYGLIDE (NEEDLE) IMPLANT
NEEDLE HYPO 22GX1.5 SAFETY (NEEDLE) IMPLANT
NEEDLE HYPO 25X5/8 SAFETYGLIDE (NEEDLE) IMPLANT
NS IRRIG 1000ML POUR BTL (IV SOLUTION) ×3 IMPLANT
PACK C SECTION WH (CUSTOM PROCEDURE TRAY) ×3 IMPLANT
PAD OB MATERNITY 4.3X12.25 (PERSONAL CARE ITEMS) ×3 IMPLANT
PENCIL SMOKE EVAC W/HOLSTER (ELECTROSURGICAL) ×3 IMPLANT
RTRCTR C-SECT PINK 25CM LRG (MISCELLANEOUS) ×3 IMPLANT
SUT CHROMIC 1 CTX 36 (SUTURE) ×6 IMPLANT
SUT CHROMIC 2 0 CT 1 (SUTURE) ×3 IMPLANT
SUT MNCRL 0 VIOLET CTX 36 (SUTURE) IMPLANT
SUT MONOCRYL 0 CTX 36 (SUTURE) ×2
SUT PDS AB 0 CTX 60 (SUTURE) ×3 IMPLANT
SUT PLAIN 2 0 XLH (SUTURE) ×2 IMPLANT
SUT VIC AB 2-0 CT1 27 (SUTURE) ×3
SUT VIC AB 2-0 CT1 TAPERPNT 27 (SUTURE) ×1 IMPLANT
SUT VIC AB 4-0 KS 27 (SUTURE) ×2 IMPLANT
SYR 30ML LL (SYRINGE) IMPLANT
TOWEL OR 17X24 6PK STRL BLUE (TOWEL DISPOSABLE) ×3 IMPLANT
TRAY FOLEY BAG SILVER LF 14FR (SET/KITS/TRAYS/PACK) ×3 IMPLANT

## 2016-11-01 NOTE — Transfer of Care (Signed)
Immediate Anesthesia Transfer of Care Note  Patient: [name] [name]  Procedure(s) Performed: Procedure(s): CESAREAN SECTION (Bilateral) BILATERAL TUBAL LIGATION (Bilateral)  Patient Location: PACU  Anesthesia Type:Spinal  Level of Consciousness: awake, alert  and oriented  Airway & Oxygen Therapy: Patient Spontanous Breathing  Post-op Assessment: Report given to RN and Post -op Vital signs reviewed and stable  Post vital signs: Reviewed and stable  Last Vitals: There were no vitals filed for this visit.  Last Pain:  Vitals:   10/31/16 2356  PainSc: 10-Worst pain ever         Complications: No apparent anesthesia complications

## 2016-11-01 NOTE — H&P (Signed)
Pt is a 41 y/o white female, [id number] at term who presents to the ER with SROM. She is scheduled for a repeat C/S in the AM. [facility] was complicated by AMA. She had a nl NIPT. She has a h.o. 2 prior c/s. PMHX: See PNR PE: VSSAF         HEENT-wnl         ABD-gravid         FHTs-reactive IMP/ IUP at term with SROM        AMA        History of C/S x 2 scheduled for repeat. Plan / to OR for repeat C/S

## 2016-11-01 NOTE — Anesthesia Postprocedure Evaluation (Signed)
Anesthesia Post Note  Patient: [name] [name]  Procedure(s) Performed: Procedure(s) (LRB): CESAREAN SECTION (Bilateral)     Patient location during evaluation: Mother Baby Anesthesia Type: Spinal Level of consciousness: awake and alert and oriented Pain management: satisfactory to patient Vital Signs Assessment: post-procedure vital signs reviewed and stable Respiratory status: spontaneous breathing and nonlabored ventilation Cardiovascular status: stable Postop Assessment: no headache, no backache, patient able to bend at knees, no signs of nausea or vomiting and adequate PO intake Anesthetic complications: no    Last Vitals:  Vitals:   11/01/16 0505 11/01/16 0600  BP: (!) 112/51 108/62  Pulse: 71 61  Resp: 16 16  Temp: 36.7 C 36.7 C    Last Pain:  Vitals:   11/01/16 0823  TempSrc:   PainSc: 3    Pain Goal:                 Madison Hickman

## 2016-11-01 NOTE — Op Note (Signed)
[name] [name], [name] [name]                  ACCOUNT NO.:  [id number]  MEDICAL RECORD NO.:  [id number]  LOCATION:                                 FACILITY:  PHYSICIAN:  Malva Limes, M.D.         DATE OF BIRTH:  DATE OF PROCEDURE:  11/01/2016 DATE OF DISCHARGE:                              OPERATIVE REPORT   PREOPERATIVE DIAGNOSES: 1. Intrauterine pregnancy at term. 2. History of prior cesarean section x2. 3. Patient declined vaginal birth after cesarean section. 4. Active labor. 5. Advanced maternal age.  POSTOP DIAGNOSES: 1. Intrauterine pregnancy at term. 2. History of prior cesarean section x2. 3. Patient declined vaginal birth after cesarean section. 4. Active labor. 5. Advanced maternal age.  PROCEDURE:  Repeat low transverse cesarean section.  SURGEON:  Malva Limes, MD  ANESTHESIA:  Spinal.  ANTIBIOTICS:  Ancef 2 g.  DRAINS:  Foley, bedside drainage.  ESTIMATED BLOOD LOSS:  900 mL.  SPECIMENS:  None.  COMPLICATIONS:  None.  FINDINGS:  The patient had normal fallopian tubes and ovaries bilaterally.  She delivered 1 live, viable white female infant in the vertex presentation.  DESCRIPTION OF PROCEDURE:  The patient was taken to the operating room, where a spinal anesthetic was administered without difficulty.  She was then placed in a dorsal supine position with a left lateral tilt.  She was prepped and draped in the usual fashion for this procedure.  Foley catheter was placed.  After the Foley catheter was placed prior to the abdomen being draped, the fetal heart tones were listened to and was noted to be 96.  At this point, a rapid prep was performed and cesarean section began urgently.  A Pfannenstiel incision was made through the previous scar.  On entering the abdominal cavity, the bladder was taken down with sharp dissection.  A low transverse uterine incision was made in the midline with the Metzenbaum scissors and extended laterally with blunt  dissection.  The infant was delivered in the vertex presentation. On delivery of the head, the oropharynx and nostrils were bulb suctioned.  The cord was doubly clamped and cut, and the infant handed to the awaiting NICU Team.  Cord blood was then obtained.  Placenta was manually removed.  The uterus was exteriorized.  Uterine cavity was wiped with a wet lap and examined.  The uterine incision was closed in a single layer of 0 Monocryl suture in a running fashion.  The bladder flap was not closed.  The uterus was then placed back in the abdominal cavity.  Hemostasis was again checked and felt to be adequate.  The parietal peritoneum and rectus muscles were closed with 2-0 Monocryl in a running fashion.  The fascia was closed with 0 Monocryl suture in a running fashion.  The subcuticular tissue was made hemostatic with the Bovie and then closed with interrupted 2-0 plain gut sutures.  The skin was closed with 3-0 Monocryl suture in a subcuticular fashion.  Steri- Strips were then placed.  The patient was taken to recovery room in stable condition.  Instrument and lap count was correct x3.  ______________________________ Malva Limes, M.D.     MA/MEDQ  D:  11/01/2016  T:  11/01/2016  Job:  [id number]

## 2016-11-01 NOTE — Anesthesia Procedure Notes (Signed)
Spinal  Patient location during procedure: OR Start time: 11/01/2016 12:58 AM Staffing Anesthesiologist: Mal Amabile Performed: anesthesiologist  Preanesthetic Checklist Completed: patient identified, site marked, surgical consent, pre-op evaluation, timeout performed, IV checked, risks and benefits discussed and monitors and equipment checked Spinal Block Patient position: sitting Prep: site prepped and draped and DuraPrep Patient monitoring: heart rate, cardiac monitor, continuous pulse ox and blood pressure Approach: midline Location: L3-4 Injection technique: single-shot Needle Needle type: Pencan  Needle gauge: 24 G Needle length: 9 cm Needle insertion depth: 5 cm Assessment Sensory level: T4 Additional Notes Patient tolerated procedure well. Adequate sensory level.

## 2016-11-01 NOTE — Anesthesia Preprocedure Evaluation (Signed)
Anesthesia Evaluation  Patient identified by MRN, date of birth, ID band Patient awake    Reviewed: Allergy & Precautions, NPO status , Patient's Chart, lab work & pertinent test results  Airway Mallampati: II  TM Distance: >3 FB Neck ROM: Full    Dental no notable dental hx. (+) Teeth Intact   Pulmonary neg pulmonary ROS,    Pulmonary exam normal breath sounds clear to auscultation       Cardiovascular hypertension, Normal cardiovascular exam+ dysrhythmias + Valvular Problems/Murmurs  Rhythm:Regular Rate:Normal     Neuro/Psych  Headaches, Anxiety    GI/Hepatic Neg liver ROS, GERD  Medicated and Controlled,Rectocele IBS   Endo/Other  negative endocrine ROS  Renal/GU negative Renal ROS  negative genitourinary   Musculoskeletal negative musculoskeletal ROS (+)   Abdominal   Peds  Hematology  (+) anemia ,   Anesthesia Other Findings   Reproductive/Obstetrics (+) Pregnancy Previous C/Section Desires sterilization                             Anesthesia Physical Anesthesia Plan  ASA: II and emergent  Anesthesia Plan: Spinal   Post-op Pain Management:    Induction:   PONV Risk Score and Plan: 4 or greater and Ondansetron, Dexamethasone, Scopolamine patch - Pre-op, Treatment may vary due to age or medical condition and Metaclopromide  Airway Management Planned: Natural Airway  Additional Equipment:   Intra-op Plan:   Post-operative Plan:   Informed Consent: I have reviewed the patients History and Physical, chart, labs and discussed the procedure including the risks, benefits and alternatives for the proposed anesthesia with the patient or authorized representative who has indicated his/her understanding and acceptance.     Plan Discussed with: Anesthesiologist, CRNA and Surgeon  Anesthesia Plan Comments:         Anesthesia Quick Evaluation

## 2016-11-01 NOTE — Anesthesia Postprocedure Evaluation (Signed)
Anesthesia Post Note  Patient: [name] [name]  Procedure(s) Performed: Procedure(s) (LRB): CESAREAN SECTION (Bilateral)     Patient location during evaluation: PACU Anesthesia Type: Spinal Level of consciousness: oriented and awake and alert Pain management: pain level controlled Vital Signs Assessment: post-procedure vital signs reviewed and stable Respiratory status: spontaneous breathing, respiratory function stable and nonlabored ventilation Cardiovascular status: blood pressure returned to baseline and stable Postop Assessment: no headache, no backache, spinal receding, no signs of nausea or vomiting and patient able to bend at knees Anesthetic complications: no    Last Vitals:  Vitals:   11/01/16 0330 11/01/16 0354  BP: 105/68 (!) 110/57  Pulse: 66 63  Resp: 15 16  Temp:  36.6 C    Last Pain:  Vitals:   11/01/16 0354  TempSrc: Oral  PainSc:    Pain Goal:                 Yanitza Shvartsman A.

## 2016-11-01 NOTE — Addendum Note (Signed)
Addendum  created 11/01/16 0825 by Shanon Payor, CRNA   Sign clinical note

## 2016-11-01 NOTE — Consult Note (Signed)
Neonatology Note:   Attendance at C-section:    I was asked by Dr. Anderson to attend this repeat C/S at term, scheduled for later today, with SROM. The mother is a G6P3A2 A pos, GBS neg with an uncomplicated pregnancy. ROM 2.5 hours prior to delivery, fluid clear. Infant vigorous with good spontaneous cry and tone. Needed no suctioning. Ap 9/9. Lungs clear to ausc in DR. To CN to care of Pediatrician.   [name] [name] C. Jaeley Wiker, MD 

## 2016-11-01 NOTE — Progress Notes (Signed)
Second fern result: positive it accurate, first entered in error.

## 2016-11-01 NOTE — Lactation Note (Signed)
This note was copied from a baby's chart. Lactation Consultation Note  Patient Name: [name] [name] [id number] Date: 11/01/2016 Reason for consult: Initial assessment;Breast surgery  Baby 13 hours old. Mom reports breast augmentation with removal of nipple 12 years ago. Mom states that she nursed her 3 older children: 14 months, 12 months (NICU baby), and 6-9 months respectively. Mom reports that she has a Medela DEBP and has been pumping at end of pregnancy and sometimes is able to see some flow, and then none at other times. Mom states that she over-produced with first child, pumped while second child in the NICU--then preemie went on to exclusively nurse. Discussed hand expressing and spoon-feeding, but mom states her nipples are very sensitive and she flows better with DEBP. Set mom up with DEBP and reviewed use, assembly and cleaning. Enc mom to put baby to breast with cues and pump after nursing. Discussed giving baby EBM, and EBM storage guidelines.  Mom given [facility] brochure, aware of OP/BFSG and LC phone line assistance after D/C. Discussed assessment and interventions with Carma Leaven, RN.  Maternal Data    Feeding Feeding Type: Breast Fed Length of feed: 15 min  LATCH Score/Interventions Latch: Grasps breast easily, tongue down, lips flanged, rhythmical sucking.  Audible Swallowing: A few with stimulation Intervention(s): Skin to skin;Hand expression  Type of Nipple: Everted at rest and after stimulation  Comfort (Breast/Nipple): Soft / non-tender     Hold (Positioning): No assistance needed to correctly position infant at breast.  LATCH Score: 9  Lactation Tools Discussed/Used Pump Review: Setup, frequency, and cleaning;Milk Storage Initiated by:: JW Date initiated:: 11/01/16   Consult Status Consult Status: Follow-up Date: 11/02/16 Follow-up type: In-patient    Sherlyn Hay 11/01/2016, 2:22 PM

## 2016-11-01 NOTE — Progress Notes (Signed)
Patient complains of right sided pain over uterus beginning immediatly after delivery of infant. Patient states pain is progressively worsening and describes it as sharp cramping. Dr. Dareen Piano returned to OR 9 prior to patient departure and made aware of patient complaint. RN instructed to continue to monitor. MD suspects uterine bruising.

## 2016-11-02 LAB — BIRTH TISSUE RECOVERY COLLECTION (PLACENTA DONATION)

## 2016-11-02 NOTE — Progress Notes (Signed)
Subjective: Postpartum Day 2: Cesarean Delivery Patient reports pain controlled, no nausea or vomiting. Ambulating without difficulty. voiding  Objective: Vital signs in last 24 hours: Temp:  [97.8 F (36.6 C)-98 F (36.7 C)] 97.8 F (36.6 C) (07/12 0535) Pulse Rate:  [66-67] 66 (07/12 0535) Resp:  [16] 16 (07/12 0535) BP: (94-95)/(54-56) 95/56 (07/12 0535)  Physical Exam:  General: alert, cooperative and appears stated age Lochia: appropriate Uterine Fundus: firm Incision: healing well DVT Evaluation: No evidence of DVT seen on physical exam.   Recent Labs  10/31/16 1225 11/01/16 0607  HGB 11.5* 9.4*  HCT 33.8* 27.5*    Assessment/Plan: Status post Cesarean section. Doing well postoperatively.  Continue current care.  Narmeen Kerper H. 11/02/2016, 11:02 AM

## 2016-11-02 NOTE — Plan of Care (Signed)
Problem: Pain Managment: Goal: General experience of comfort will improve Outcome: Completed/Met Date Met: 11/02/16 Set up [place] in patient's room.  Has been effective at reducing patient's pain score

## 2016-11-02 NOTE — Progress Notes (Signed)
Edinburgh scale placed at patient's bedside, requested patient complete it.

## 2016-11-02 NOTE — Lactation Note (Signed)
This note was copied from a baby's chart. Mother requesting formula, education/LEAD discussed.  Mother states understanding, reports that since she has had a breast augmentation, she would like to make sure that baby is getting enough to eat.  Normal newborn behavior discussed.  Bottle placed at bedside with instructions for use explained.

## 2016-11-02 NOTE — Lactation Note (Signed)
This note was copied from a baby's chart. Lactation Consultation Note  Patient Name: [name] [name] [id number] Date: 11/02/2016  Mom states breasts are feeling heavy and she recently pumped 5 mls.  Baby is latching and feeding well per mom.  She is doing some post pumping for stimulation.  Mom a little concerned about milk production since nipple was removed for breast augmentation.  Encouraged to call with concerns/assist prn.   Maternal Data    Feeding    LATCH Score/Interventions                      Lactation Tools Discussed/Used     Consult Status      Huston Foley 11/02/2016, 2:24 PM

## 2016-11-03 ENCOUNTER — Encounter (HOSPITAL_COMMUNITY): Payer: Self-pay | Admitting: *Deleted

## 2016-11-03 MED ORDER — OXYCODONE-ACETAMINOPHEN 5-325 MG PO TABS: 2.0000 | ORAL_TABLET | ORAL | 0 refills | Status: DC | PRN

## 2016-11-03 NOTE — Discharge Summary (Signed)
Obstetric Discharge Summary Reason for Admission: cesarean section Prenatal Procedures: ultrasound Intrapartum Procedures: cesarean: low cervical, transverse Postpartum Procedures: none Complications-Operative and Postpartum: none Hemoglobin  Date Value Ref Range Status  11/01/2016 9.4 (L) 12.0 - 15.0 g/dL Final   HCT  Date Value Ref Range Status  11/01/2016 27.5 (L) 36.0 - 46.0 % Final    Physical Exam:  General: alert Lochia: appropriate Uterine Fundus: firm Incision: healing well DVT Evaluation: No evidence of DVT seen on physical exam.  Discharge Diagnoses: Term Pregnancy-delivered  Discharge Information: Date: 11/03/2016 Activity: pelvic rest Diet: routine Medications: PNV, Ibuprofen and Percocet Condition: stable Instructions: refer to practice specific booklet Discharge to: home Follow-up Information    Levi Aland, MD. Schedule an appointment as soon as possible for a visit in 1 month(s).   Specialty:  Obstetrics and Gynecology Contact information: [address] [place] [address] [phone]           Newborn Data: Live born female  Birth Weight: 8 lb 7.8 oz (3850 g) APGAR: 9, 9  Home with mother.  ANDERSON,MARK E 11/03/2016, 9:19 AM

## 2016-11-03 NOTE — Progress Notes (Signed)
POD#3 Pt without complaints. Would like to go home.  VSSAF IMP./doing well Plan/ Will discharge

## 2017-10-16 ENCOUNTER — Ambulatory Visit (INDEPENDENT_AMBULATORY_CARE_PROVIDER_SITE_OTHER): Payer: 59 | Admitting: Family Medicine

## 2017-10-16 ENCOUNTER — Ambulatory Visit (HOSPITAL_BASED_OUTPATIENT_CLINIC_OR_DEPARTMENT_OTHER)
Admission: RE | Admit: 2017-10-16 | Discharge: 2017-10-16 | Disposition: A | Discharge status: Home / Self Care | Payer: 59 | Source: Ambulatory Visit | Attending: Family Medicine | Admitting: Family Medicine

## 2017-10-16 ENCOUNTER — Encounter: Payer: Self-pay | Admitting: Family Medicine

## 2017-10-16 VITALS — BP 115/84 | HR 98 | Temp 99.1°F | Resp 20 | Ht 63.0 in | Wt 128.0 lb

## 2017-10-16 DIAGNOSIS — R05 Cough: Secondary | ICD-10-CM

## 2017-10-16 DIAGNOSIS — R0602 Shortness of breath: Secondary | ICD-10-CM | POA: Diagnosis not present

## 2017-10-16 DIAGNOSIS — R5383 Other fatigue: Secondary | ICD-10-CM

## 2017-10-16 DIAGNOSIS — R000 Tachycardia, unspecified: Secondary | ICD-10-CM | POA: Diagnosis not present

## 2017-10-16 DIAGNOSIS — Z7689 Persons encountering health services in other specified circumstances: Secondary | ICD-10-CM

## 2017-10-16 DIAGNOSIS — R0789 Other chest pain: Secondary | ICD-10-CM

## 2017-10-16 DIAGNOSIS — R350 Frequency of micturition: Secondary | ICD-10-CM | POA: Diagnosis not present

## 2017-10-16 DIAGNOSIS — R011 Cardiac murmur, unspecified: Secondary | ICD-10-CM | POA: Diagnosis not present

## 2017-10-16 DIAGNOSIS — I2699 Other pulmonary embolism without acute cor pulmonale: Secondary | ICD-10-CM | POA: Diagnosis not present

## 2017-10-16 DIAGNOSIS — R059 Cough, unspecified: Secondary | ICD-10-CM

## 2017-10-16 LAB — POC URINALSYSI DIPSTICK (AUTOMATED)
BILIRUBIN UA: NEGATIVE
Blood, UA: NEGATIVE
GLUCOSE UA: NEGATIVE
Ketones, UA: NEGATIVE
NITRITE UA: NEGATIVE
Protein, UA: NEGATIVE
Spec Grav, UA: 1.01 (ref 1.010–1.025)
Urobilinogen, UA: 0.2 E.U./dL
pH, UA: 8 (ref 5.0–8.0)

## 2017-10-16 MED ORDER — CEPHALEXIN 500 MG PO CAPS: 500.0000 mg | ORAL_CAPSULE | Freq: Three times a day (TID) | ORAL | 0 refills | Status: DC

## 2017-10-16 NOTE — Patient Instructions (Signed)
Get cxr completed today at [facility]. I ordered echo, they will call to schedule.  Treat UTI with keflex- called in.  I will call you labs once available.  If worsening symptoms, pain, shortness of breath go to ED immediately or call 911.     Pulmonary Embolism A pulmonary embolism (PE) is a sudden blockage or decrease of blood flow in one lung or both lungs. Most blockages come from a blood clot that forms in a lower leg, thigh, or arm vein (deep vein thrombosis, DVT) and travels to the lungs. A clot is blood that has thickened into a gel or solid. PE is a dangerous and life-threatening condition that needs to be treated right away. What are the causes? This condition is usually caused by a blood clot that forms in a vein and moves to the lungs. In rare cases, it may be caused by air, fat, part of a tumor, or other tissue that moves through the veins and into the lungs. What increases the risk? The following factors may make you more likely to develop this condition:  Having DVT or a history of DVT.  Being older than age 85.  Personal or family history of blood clots or blood clotting disease.  Major or lengthy surgery.  Orthopedic surgery, especially hip or knee replacement.  Traumatic injury, such as breaking a hip or leg.  Spinal cord injury.  Stroke.  Taking medicines that contain estrogen. These include birth control pills and hormone replacement therapy.  Long-term (chronic) lung or heart disease.  Cancer and chemotherapy.  Having a central venous catheter.  Pregnancy and the period after delivery.  What are the signs or symptoms? Symptoms of this condition usually start suddenly and include:  Shortness of breath while active or at rest.  Coughing or coughing up blood or blood-tinged mucus.  Chest pain that is often worse with deep breaths.  Rapid or irregular heartbeat.  Feeling light-headed or dizzy.  Fainting.  Feeling  anxious.  Sweating.  Pain and swelling in a leg. This is a symptom of DVT, which can lead to PE.  How is this diagnosed? This condition may be diagnosed based on:  Your medical history.  A physical exam.  Blood tests to check blood oxygen level and how well your blood clots, and a D-dimer blood test, which checks your blood for a substance that is released when a blood clot breaks apart.  CT pulmonary angiogram. This test checks blood flow in and around your lungs.  Ventilation-perfusion scan, also called a lung VQ scan. This test measures air flow and blood flow to the lungs.  Ultrasound of the legs to look for blood clots.  How is this treated? Treatment for this conditions depends on many factors, such as the cause of your PE, your risk for bleeding or developing more clots, and other medical conditions you have. Treatment aims to remove, dissolve, or stop blood clots from forming or growing larger. Treatment may include:  Blood thinning medicines (anticoagulants) to stop clots from forming or growing. These medicines may be given as a pill, as an injection, or through an IV tube (infusion).  Medicines that dissolve clots (thrombolytics).  A procedure in which a flexible tube is used to remove a blood clot (embolectomy) or deliver medicine to destroy it (catheter-directed thrombolysis).  A procedure in which a filter is inserted into a large vein that carries blood to the heart (inferior vena cava). This filter (vena cava filter) catches blood clots  before they reach the lungs.  Surgery to remove the clot (surgical embolectomy). This is rare.  You may need a combination of immediate, long-term (up to 3 months after diagnosis), and extended (more than 3 months after diagnosis) treatments. Your treatment may continue for several months (maintenance therapy). You and your health care provider will work together to choose the treatment program that is best for you. Follow these  instructions at home: If you are taking an anticoagulant medicine:  Take the medicine every day at the same time each day.  Understand what foods and drugs interact with your medicine.  Understand the side effects of this medicine, including excessive bruising or bleeding. Ask your health care provider or pharmacist about other side effects. General instructions  Take over-the-counter and prescription medicines only as told by your health care provider.  Anticoagulant medicines may cause side effects, including easy bruising and difficulty stopping bleeding. If you are prescribed an anticoagulant: ? Hold pressure over cuts for longer than usual. ? Tell your dentist and other health care providers that you are taking anticoagulants before you have any procedure that may cause bleeding. ? Avoid contact sports. ? Be extra careful when handling sharp objects. ? Use a soft toothbrush. Floss with waxed dental floss. ? Shave with an Neurosurgeon.  Wear a medical alert bracelet or carry a medical alert card that says you have had a PE.  Ask your health care provider when you may return to your normal activities.  Talk with your health care provider about any travel plans. It is important to make sure that you are still able to take your medicine while on trips.  Keep all follow-up visits as told by your health care provider. This is important. How is this prevented? Take these actions to lower your risk of developing another PE:  Exercise regularly. Take frequent walks. For at least 30 minutes every day, engage in: ? Activity that involves moving your arms and legs. ? Activity that encourages good blood flow through your body by increasing your heart rate.  While traveling, drink plenty of water and avoid drinking alcohol. Ask your health care provider if you should wear below-the-knee compression stockings.  Avoid sitting or lying in bed for long periods of time without moving your  legs. Exercise your arms and legs every hour during long-distance travel (over 4 hours).  If you are hospitalized or have surgery, ask your health care provider about your risks and what treatments can help prevent blood clots.  Maintain a healthy weight. Ask your health care provider what weight is healthy for you.  If you are a woman who is over age 85, avoid unnecessary use of medicines that contain estrogen, including birth control pills.  Do not use any products that contain nicotine or tobacco, such as cigarettes and e-cigarettes. This is especially important if you take estrogen medicines. If you need help quitting, ask your health care provider.  See your health care provider for regular checkups. This may include blood tests and ultrasound testing on your legs to check for new blood clots.  Contact a health care provider if:  You missed a dose of your blood thinner medicine. Get help right away if:  You have new or increased pain, swelling, warmth, or redness in an arm or leg.  You have numbness or tingling in an arm or leg.  You have shortness of breath while active or at rest.  You have chest pain.  You have a  rapid or irregular heartbeat.  You feel light-headed or dizzy.  You cough up blood.  You have blood in your vomit, stool, or urine.  You have a fever.  You have abdomen (abdominal) pain.  You have a severe fall or head injury.  You have a severe headache.  You have vision changes.  You cannot move your arms or legs.  You are confused or have memory loss.  You are bleeding for 10 minutes or more, even with strong pressure on the wound. These symptoms may represent a serious problem that is an emergency. Do not wait to see if the symptoms will go away. Get medical help right away. Call your local emergency services (911 in the [place] Do not drive yourself to the hospital. Summary  A pulmonary embolism (PE) is a sudden blockage or decrease of blood  flow in one lung or both lungs. PE is a dangerous and life-threatening condition that needs to be treated right away.  Having deep vein thrombosis (DVT) or a history of DVT is the most common risk factor for PE.  Treatments for this condition usually include medicines to thin your blood (anticoagulants) or medicines to break apart blood clots (thrombolytics).  If you are prescribed blood thinners, it is important to take the medicine every single day at the same time each day.  If you have signs of PE or DVT, call your local emergency services (911 in the [place] This information is not intended to replace advice given to you by your health care provider. Make sure you discuss any questions you have with your health care provider. Document Released: 04/07/2000 Document Revised: 05/13/2016 Document Reviewed: 05/13/2016 [employer] Interactive Patient Education  2018 [employer].

## 2017-10-16 NOTE — Progress Notes (Signed)
Patient ID: [name], female  DOB: [birth year], 42 y.o.   MRN: [id number] Patient Care Team    Relationship Specialty Notifications Start End  [name], DO PCP - General Family Medicine  10/16/17     Chief Complaint  Patient presents with  . Cough    shortness of breath x 3 weeks    Subjective:  [name] [name] is a 42 y.o.  female present for new patient establishment. All past medical history, surgical history, allergies, family history, immunizations, medications and social history were updated in the electronic medical record today. All recent labs, ED visits and hospitalizations within the last year were reviewed.  Cough: pt presents with a 3 week h/o cough and shortness of breath.  She reports about 3 weeks ago she felt a pop in her right lower back, in which she felt was in her lung.  She had pain in that location for about a week and then it became excruciating pain for 1 day turning into a very bad spasm that radiated to her right shoulder.  The day after the severe pain started, it ended and the cough began.  Cough is nonproductive and persistent.  She has increasingly become more winded having difficulty carrying out daily tasks and speaking in full sentences without feeling winded.    She denies any lung disease history.  She is a non-smoker.  She has started new birth control pills approximately 4 weeks ago.  She states she does feel like she has a mild fever, she breaks into sweats with her coughing spells.  Has no recent travel.  Endorses urinary frequency with foul-smelling odor as well.  That is not related to the above.  She had a history of feeling winded and chest pressure last year around this time when she was pregnant.  Work-up completed at that time revealed intermittent Mobitz  type I arrhythmia.  Echocardiogram at that time with ejection fraction of 55 to 60%, abnormal septal motion, pulmonic valve, mitral valve and tricuspid valve trivial regurg.  She has a  history of a murmur which she states was only present when she was younger. History:   Risk factors:  Second degree AV block. Pregnant.  ECHO 10/05/2016: ------------------------------------------------------------------- Study Conclusions  - Left ventricle: Abnormal septal motion. The cavity size was   normal. Systolic function was normal. The estimated ejection   fraction was in the range of 55% to 60%. Wall motion was normal;   there were no regional wall motion abnormalities. Left   ventricular diastolic function parameters were normal.  ------------------------------------------------------------------- Study data:  No prior study was available for comparison.  Study status:  Routine.  Procedure:  Transthoracic echocardiography. Image quality was adequate.  Study completion:  There were no complications.          Transthoracic echocardiography.  M-mode, complete 2D, spectral Doppler, and color Doppler.  Birthdate: Patient birthdate: 04/13/1976.  Age:  Patient is 42 yr old.  Sex: Gender: female.    BMI: 27.4 kg/m^2.  Blood pressure:     115/74 Patient status:  Inpatient.  Study date:  Study date: 10/05/2016. Study time: 09:39 PM.  Location:  Bedside.  -------------------------------------------------------------------  ------------------------------------------------------------------- Left ventricle:  Abnormal septal motion. The cavity size was normal. Systolic function was normal. The estimated ejection fraction was in the range of 55% to 60%. Wall motion was normal; there were no regional wall motion abnormalities. The transmitral flow pattern was normal. The deceleration time of the  early transmitral flow velocity was normal. The pulmonary vein flow pattern was normal. The tissue Doppler parameters were normal. Left ventricular diastolic function parameters were normal.  ------------------------------------------------------------------- Aortic valve:   Trileaflet;  normal thickness leaflets. Mobility was not restricted.  Doppler:  Transvalvular velocity was within the normal range. There was no stenosis. There was no regurgitation.   ------------------------------------------------------------------- Aorta:  The aorta was normal, not dilated, and non-diseased. Aortic root: The aortic root was normal in size.  ------------------------------------------------------------------- Mitral valve:   Structurally normal valve.   Mobility was not restricted.  Doppler:  Transvalvular velocity was within the normal range. There was no evidence for stenosis. There was trivial regurgitation.  ------------------------------------------------------------------- Left atrium:  The atrium was normal in size.  ------------------------------------------------------------------- Atrial septum:  Poorly visualized.  ------------------------------------------------------------------- Right ventricle:  The cavity size was normal. Wall thickness was normal. Systolic function was normal.  ------------------------------------------------------------------- Pulmonic valve:    Doppler:  Transvalvular velocity was within the normal range. There was no evidence for stenosis. There was trivial regurgitation.  ------------------------------------------------------------------- Tricuspid valve:   Structurally normal valve.    Doppler: Transvalvular velocity was within the normal range. There was mild regurgitation.  ------------------------------------------------------------------- Pulmonary artery:   The main pulmonary artery was normal-sized. Systolic pressure was within the normal range.  ------------------------------------------------------------------- Right atrium:  The atrium was normal in size.  ------------------------------------------------------------------- Pericardium:  The pericardium was normal in appearance. There was no pericardial  effusion.  ------------------------------------------------------------------- Systemic veins: Inferior vena cava: The vessel was normal in size.  ------------------------------------------------------------------- Post procedure conclusions Ascending Aorta:  - The aorta was normal, not dilated, and non-diseased.  Depression screen [facility] 2/9 10/16/2017  Decreased Interest 0  Down, Depressed, Hopeless 0  PHQ - 2 Score 0  Altered sleeping 1  Tired, decreased energy 0  Change in appetite 0  Feeling bad or failure about yourself  0  Moving slowly or fidgety/restless 0  Suicidal thoughts 0  PHQ-9 Score 1   No flowsheet data found.    Current Exercise Habits: Home exercise routine, Time (Minutes): 60, Frequency (Times/Week): 5, Weekly Exercise (Minutes/Week): 300, Intensity: Moderate   No flowsheet data found.   Immunization History  Administered Date(s) Administered  . Tdap 08/09/2016    No exam data present  Past Medical History:  Diagnosis Date  . AMA (advanced maternal age) multigravida 91+   . Arrhythmia    Mobitz type 1  . Chicken pox   . Depression   . Depression with anxiety   . Frequent headaches   . Heart murmur    as a child  . Tachycardia    as a child  . Thyroid disease   . Urinary incontinence    Allergies  Allergen Reactions  . Sulfa Antibiotics Anaphylaxis and Hives  . Erythromycin Other (See Comments)    unknown As a child, unknown reaction  . Morphine Other (See Comments)    aggressiveness  . Morphine And Related Itching and Other (See Comments)    hallucinations   Past Surgical History:  Procedure Laterality Date  . ABDOMINOPLASTY    . BREAST ENHANCEMENT SURGERY    . CESAREAN SECTION    . CESAREAN SECTION Bilateral 11/01/2016   Procedure: CESAREAN SECTION;  Surgeon: Olga Millers, MD;  Location: [place];  Service: Obstetrics;  Laterality: Bilateral;  . KIDNEY SURGERY     congenital abnormality  of ureters  .  MANDIBLE SURGERY    . TONSILLECTOMY     Family History  Problem  Relation Age of Onset  . Diabetes Mother   . Arthritis Mother   . Depression Mother   . Stroke Mother   . Miscarriages / [place] Mother   . Mental illness Mother   . Hyperlipidemia Father   . Hypertension Father   . Alcohol abuse Father   . Arthritis Father   . Depression Father   . Drug abuse Father   . Hearing loss Father   . Cancer Paternal Aunt   . Cancer Paternal Grandmother   . Ovarian cancer Paternal Grandmother   . Breast cancer Paternal Grandmother   . Mental illness Sister   . Asthma Brother   . Depression Brother   . Asthma Son   . Depression Maternal Grandmother   . Diabetes Maternal Grandmother   . Heart disease Maternal Grandfather   . Kidney disease Maternal Grandfather   . Stroke Maternal Grandfather   . Alcohol abuse Paternal Grandfather   . Asthma Paternal Grandfather   . Depression Paternal Grandfather   . Drug abuse Paternal Grandfather   . Hearing loss Paternal Grandfather   . Heart disease Paternal Grandfather   . Hyperlipidemia Paternal Grandfather   . Mental illness Paternal Grandfather    Social History   Socioeconomic History  . Marital status: Married    Spouse name: Not on file  . Number of children: Not on file  . Years of education: Not on file  . Highest education level: Not on file  Occupational History  . Not on file  Social Needs  . Financial resource strain: Not on file  . Food insecurity:    Worry: Not on file    Inability: Not on file  . Transportation needs:    Medical: Not on file    Non-medical: Not on file  Tobacco Use  . Smoking status: Former Research scientist (life sciences)  . Smokeless tobacco: Never Used  Substance and Sexual Activity  . Alcohol use: Yes  . Drug use: No  . Sexual activity: Yes    Partners: Male    Birth control/protection: None  Lifestyle  . Physical activity:    Days per week: Not on file    Minutes per session: Not on file  . Stress: Not on  file  Relationships  . Social connections:    Talks on phone: Not on file    Gets together: Not on file    Attends religious service: Not on file    Active member of club or organization: Not on file    Attends meetings of clubs or organizations: Not on file    Relationship status: Not on file  . Intimate partner violence:    Fear of current or ex partner: Not on file    Emotionally abused: Not on file    Physically abused: Not on file    Forced sexual activity: Not on file  Other Topics Concern  . Not on file  Social History Narrative   Married, college-educated.  Works in Press photographer.  4 children.   Social drinker.  Former smoker.   Exercises 3 times a week.   Smoke alarm in the home.  Wears her seatbelt.   Feels safe in her relationships.   Allergies as of 10/16/2017      Reactions   Sulfa Antibiotics Anaphylaxis, Hives   Erythromycin Other (See Comments)   unknown As a child, unknown reaction   Morphine Other (See Comments)   aggressiveness   Morphine And Related Itching, Other (See Comments)   hallucinations  Medication List        Accurate as of 10/16/17 11:59 PM. Always use your most recent med list.          cephALEXin 500 MG capsule Commonly known as:  KEFLEX Take 1 capsule (500 mg total) by mouth 3 (three) times daily.   prenatal multivitamin Tabs tablet Take 1 tablet by mouth daily at 12 noon.   SPRINTEC 28 0.25-35 MG-MCG tablet Generic drug:  norgestimate-ethinyl estradiol norethindrone (contraceptive) 0.35 mg tablet       All past medical history, surgical history, allergies, family history, immunizations andmedications were updated in the EMR today and reviewed under the history and medication portions of their EMR.    Recent Results (from the past 2160 hour(s))  D-Dimer, Quantitative     Status: Abnormal   Collection Time: 10/16/17  2:54 PM  Result Value Ref Range   D-Dimer, Quant 4.30 (H) <0.50 mcg/mL FEU    Comment: . The D-Dimer test is  used frequently to exclude an acute PE or DVT. In patients with a low to moderate clinical risk assessment and a D-Dimer result <0.50 mcg/mL FEU, the likelihood of a PE or DVT is very low. However, a thromboembolic event should not be excluded solely on the basis of the D-Dimer level. Increased levels of D-Dimer are associated with a PE, DVT, DIC, malignancies, inflammation, sepsis, surgery, trauma, pregnancy, and advancing patient age. [Jama 2006 11:295(2):199-207] . For additional information, please refer to: http://education.questdiagnostics.com/faq/FAQ149 (This link is being provided for informational/ educational purposes only) .   CBC     Status: None   Collection Time: 10/16/17  2:54 PM  Result Value Ref Range   WBC 8.8 3.8 - 10.8 Thousand/uL   RBC 4.37 3.80 - 5.10 Million/uL   Hemoglobin 14.3 11.7 - 15.5 g/dL   HCT 41.5 35.0 - 45.0 %   MCV 95.0 80.0 - 100.0 fL   MCH 32.7 27.0 - 33.0 pg   MCHC 34.5 32.0 - 36.0 g/dL   RDW 11.2 11.0 - 15.0 %   Platelets 347 140 - 400 Thousand/uL   MPV 10.2 7.5 - 12.5 fL  Iron, TIBC and Ferritin Panel     Status: Abnormal   Collection Time: 10/16/17  2:54 PM  Result Value Ref Range   Iron 25 (L) 40 - 190 mcg/dL   TIBC 393 250 - 450 mcg/dL (calc)   %SAT 6 (L) 16 - 45 % (calc)   Ferritin 185 (H) 16 - 154 ng/mL  Comp Met (CMET)     Status: None   Collection Time: 10/16/17  2:54 PM  Result Value Ref Range   Glucose, Bld 92 65 - 99 mg/dL    Comment: .            Fasting reference interval .    BUN 11 7 - 25 mg/dL   Creat 0.86 0.50 - 1.10 mg/dL   BUN/Creatinine Ratio NOT APPLICABLE 6 - 22 (calc)   Sodium 140 135 - 146 mmol/L   Potassium 4.9 3.5 - 5.3 mmol/L   Chloride 104 98 - 110 mmol/L   CO2 25 20 - 32 mmol/L   Calcium 10.0 8.6 - 10.2 mg/dL   Total Protein 8.1 6.1 - 8.1 g/dL   Albumin 4.4 3.6 - 5.1 g/dL   Globulin 3.7 1.9 - 3.7 g/dL (calc)   AG Ratio 1.2 1.0 - 2.5 (calc)   Total Bilirubin 0.4 0.2 - 1.2 mg/dL   Alkaline  phosphatase (APISO) 84 33 -  115 U/L   AST 14 10 - 30 U/L   ALT 12 6 - 29 U/L  TSH     Status: None   Collection Time: 10/16/17  2:54 PM  Result Value Ref Range   TSH 1.29 mIU/L    Comment:           Reference Range .           > or = 20 Years  0.40-4.50 .                Pregnancy Ranges           First trimester    0.26-2.66           Second trimester   0.55-2.73           Third trimester    0.43-2.91   POCT Urinalysis Dipstick (Automated)     Status: Abnormal   Collection Time: 10/16/17  3:01 PM  Result Value Ref Range   Color, UA yellow    Clarity, UA clear    Glucose, UA Negative Negative   Bilirubin, UA Negative    Ketones, UA Negative    Spec Grav, UA 1.010 1.010 - 1.025   Blood, UA Negative    pH, UA 8.0 5.0 - 8.0   Protein, UA Negative Negative   Urobilinogen, UA 0.2 0.2 or 1.0 E.U./dL   Nitrite, UA Negative    Leukocytes, UA Large (3+) (A) Negative    No results found.   ROS: 14 pt review of systems performed and negative (unless mentioned in an HPI)  Objective: BP 115/84 (BP Location: Right Arm, Patient Position: Sitting, Cuff Size: Normal)   Pulse 98   Temp 99.1 F (37.3 C)   Resp 20   Ht '5\' 3"'$  (1.6 m)   Wt 128 lb (58.1 kg)   SpO2 95%   BMI 22.67 kg/m  Gen: Afebrile. No acute distress. Nontoxic in appearance, well-developed, well-nourished,  Difficulty speaking full sentences without being winded.  HENT: AT. [place].  MMM. Throat without erythema, ulcerations or exudates. persistent dry Cough on exam, no hoarseness on exam. Eyes:Pupils Equal Round Reactive to light, Extraocular movements intact,  Conjunctiva without redness, discharge or icterus. Neck/lymp/endocrine: Supple,no lymphadenopathy, no thyromegaly CV: RRR (mildly tachy) 2/6 SM RUSB, left carotid and back. No edema, +2/4 P posterior tibialis pulses.  Chest: CTAB, no wheeze, rhonchi or crackles. Difficulty talking without becoming winded and coughing. Abd: Soft. flat. NTND. BS present. no Masses  palpated. No hepatosplenomegaly. No rebound tenderness or guarding. Skin: no rashes, purpura or petechiae. Warm and well-perfused. Skin intact. Neuro/Msk:  Normal gait. PERLA. EOMi. Alert. Oriented x3. Psych: Normal affect, dress and demeanor. Normal speech. Normal thought content and judgment. EKG today, SR. HR 87. JO841. QT 392.   Results for orders placed or performed in visit on 10/16/17 (from the past 48 hour(s))  D-Dimer, Quantitative     Status: Abnormal   Collection Time: 10/16/17  2:54 PM  Result Value Ref Range   D-Dimer, Quant 4.30 (H) <0.50 mcg/mL FEU    Comment: . The D-Dimer test is used frequently to exclude an acute PE or DVT. In patients with a low to moderate clinical risk assessment and a D-Dimer result <0.50 mcg/mL FEU, the likelihood of a PE or DVT is very low. However, a thromboembolic event should not be excluded solely on the basis of the D-Dimer level. Increased levels of D-Dimer are associated with a PE, DVT, DIC, malignancies, inflammation, sepsis, surgery,  trauma, pregnancy, and advancing patient age. [Jama 2006 11:295(2):199-207] . For additional information, please refer to: http://education.questdiagnostics.com/faq/FAQ149 (This link is being provided for informational/ educational purposes only) .   CBC     Status: None   Collection Time: 10/16/17  2:54 PM  Result Value Ref Range   WBC 8.8 3.8 - 10.8 Thousand/uL   RBC 4.37 3.80 - 5.10 Million/uL   Hemoglobin 14.3 11.7 - 15.5 g/dL   HCT 41.5 35.0 - 45.0 %   MCV 95.0 80.0 - 100.0 fL   MCH 32.7 27.0 - 33.0 pg   MCHC 34.5 32.0 - 36.0 g/dL   RDW 11.2 11.0 - 15.0 %   Platelets 347 140 - 400 Thousand/uL   MPV 10.2 7.5 - 12.5 fL  Iron, TIBC and Ferritin Panel     Status: Abnormal   Collection Time: 10/16/17  2:54 PM  Result Value Ref Range   Iron 25 (L) 40 - 190 mcg/dL   TIBC 393 250 - 450 mcg/dL (calc)   %SAT 6 (L) 16 - 45 % (calc)   Ferritin 185 (H) 16 - 154 ng/mL  Comp Met (CMET)     Status:  None   Collection Time: 10/16/17  2:54 PM  Result Value Ref Range   Glucose, Bld 92 65 - 99 mg/dL    Comment: .            Fasting reference interval .    BUN 11 7 - 25 mg/dL   Creat 0.86 0.50 - 1.10 mg/dL   BUN/Creatinine Ratio NOT APPLICABLE 6 - 22 (calc)   Sodium 140 135 - 146 mmol/L   Potassium 4.9 3.5 - 5.3 mmol/L   Chloride 104 98 - 110 mmol/L   CO2 25 20 - 32 mmol/L   Calcium 10.0 8.6 - 10.2 mg/dL   Total Protein 8.1 6.1 - 8.1 g/dL   Albumin 4.4 3.6 - 5.1 g/dL   Globulin 3.7 1.9 - 3.7 g/dL (calc)   AG Ratio 1.2 1.0 - 2.5 (calc)   Total Bilirubin 0.4 0.2 - 1.2 mg/dL   Alkaline phosphatase (APISO) 84 33 - 115 U/L   AST 14 10 - 30 U/L   ALT 12 6 - 29 U/L  TSH     Status: None   Collection Time: 10/16/17  2:54 PM  Result Value Ref Range   TSH 1.29 mIU/L    Comment:           Reference Range .           > or = 20 Years  0.40-4.50 .                Pregnancy Ranges           First trimester    0.26-2.66           Second trimester   0.55-2.73           Third trimester    0.43-2.91   POCT Urinalysis Dipstick (Automated)     Status: Abnormal   Collection Time: 10/16/17  3:01 PM  Result Value Ref Range   Color, UA yellow    Clarity, UA clear    Glucose, UA Negative Negative   Bilirubin, UA Negative    Ketones, UA Negative    Spec Grav, UA 1.010 1.010 - 1.025   Blood, UA Negative    pH, UA 8.0 5.0 - 8.0   Protein, UA Negative Negative   Urobilinogen, UA 0.2 0.2 or 1.0 E.U./dL  Nitrite, UA Negative    Leukocytes, UA Large (3+) (A) Negative      Assessment/plan: [name] [name] is a 42 y.o. female present for establish care with 3 weeks of cough ans shortness of breath.  Shortness of breath/cough/fatigue/chest discomfort/tachycardia/murmur radiates to back - concern for PE (on new BCP of 4 weeks) or cardiac cause of her symptoms, rather significant murmur appreciated with radiation to left carotid and back. She has had a cardiac workup last year for similar compliant  w/ Mobitz type 1, septal wall movement abnormality and trivial regurg of PV, TV, MV.  - CBC, IRON panel, CMP, D-dimer collected today.  - CXR to be completed today.  - Echo ordered ASAP - EKG today, SR. HR 87. QM086. QT 392.  - pt advised if symptoms worsen to go to ED or call 911 immediately.  - DG Chest 2 View; Future - D-Dimer, Quantitative - CBC - Iron, TIBC and Ferritin Panel - Comp Met (CMET) - TSH - ECHOCARDIOGRAM COMPLETE; Future  Urinary frequency -Positive for leukocytes, Keflex 3 times daily prescribed, urine culture sent. unlikely contributing to the above complaint - POCT Urinalysis Dipstick (Automated) - Urine Culture  Return in about 1 week (around 10/23/2017).  Greater than 60 minutes was spent with patient, greater than 50% of that time was spent face-to-face with patient counseling and  coordinating care.   Note is dictated utilizing voice recognition software. Although note has been proof read prior to signing, occasional typographical errors still can be missed. If any questions arise, please do not hesitate to call for verification.  Electronically signed by: Howard Pouch, DO [place]

## 2017-10-17 ENCOUNTER — Inpatient Hospital Stay (HOSPITAL_COMMUNITY)
Admission: EM | Admit: 2017-10-17 | Discharge: 2017-10-19 | DRG: 176 | Disposition: A | Discharge status: Home / Self Care | Payer: 59 | Attending: Internal Medicine | Admitting: Internal Medicine

## 2017-10-17 ENCOUNTER — Emergency Department (HOSPITAL_COMMUNITY): Payer: 59

## 2017-10-17 ENCOUNTER — Inpatient Hospital Stay (HOSPITAL_COMMUNITY): Payer: 59

## 2017-10-17 ENCOUNTER — Encounter (HOSPITAL_COMMUNITY): Payer: Self-pay | Admitting: Emergency Medicine

## 2017-10-17 ENCOUNTER — Other Ambulatory Visit: Payer: Self-pay

## 2017-10-17 ENCOUNTER — Encounter: Payer: Self-pay | Admitting: Family Medicine

## 2017-10-17 ENCOUNTER — Telehealth: Payer: Self-pay | Admitting: Family Medicine

## 2017-10-17 DIAGNOSIS — N920 Excessive and frequent menstruation with regular cycle: Secondary | ICD-10-CM | POA: Diagnosis present

## 2017-10-17 DIAGNOSIS — I10 Essential (primary) hypertension: Secondary | ICD-10-CM | POA: Diagnosis present

## 2017-10-17 DIAGNOSIS — Z7901 Long term (current) use of anticoagulants: Secondary | ICD-10-CM | POA: Diagnosis not present

## 2017-10-17 DIAGNOSIS — Z818 Family history of other mental and behavioral disorders: Secondary | ICD-10-CM | POA: Diagnosis not present

## 2017-10-17 DIAGNOSIS — Z5181 Encounter for therapeutic drug level monitoring: Secondary | ICD-10-CM

## 2017-10-17 DIAGNOSIS — I2699 Other pulmonary embolism without acute cor pulmonale: Secondary | ICD-10-CM | POA: Diagnosis present

## 2017-10-17 DIAGNOSIS — Z882 Allergy status to sulfonamides status: Secondary | ICD-10-CM | POA: Diagnosis not present

## 2017-10-17 DIAGNOSIS — F418 Other specified anxiety disorders: Secondary | ICD-10-CM | POA: Diagnosis present

## 2017-10-17 DIAGNOSIS — R091 Pleurisy: Secondary | ICD-10-CM

## 2017-10-17 DIAGNOSIS — Z87891 Personal history of nicotine dependence: Secondary | ICD-10-CM | POA: Diagnosis not present

## 2017-10-17 DIAGNOSIS — R0602 Shortness of breath: Secondary | ICD-10-CM

## 2017-10-17 DIAGNOSIS — E079 Disorder of thyroid, unspecified: Secondary | ICD-10-CM | POA: Diagnosis present

## 2017-10-17 DIAGNOSIS — Z885 Allergy status to narcotic agent status: Secondary | ICD-10-CM

## 2017-10-17 DIAGNOSIS — R05 Cough: Secondary | ICD-10-CM | POA: Diagnosis present

## 2017-10-17 DIAGNOSIS — R011 Cardiac murmur, unspecified: Secondary | ICD-10-CM | POA: Diagnosis present

## 2017-10-17 DIAGNOSIS — E611 Iron deficiency: Secondary | ICD-10-CM | POA: Diagnosis present

## 2017-10-17 DIAGNOSIS — R042 Hemoptysis: Secondary | ICD-10-CM | POA: Diagnosis present

## 2017-10-17 DIAGNOSIS — Z8249 Family history of ischemic heart disease and other diseases of the circulatory system: Secondary | ICD-10-CM

## 2017-10-17 DIAGNOSIS — N390 Urinary tract infection, site not specified: Secondary | ICD-10-CM | POA: Diagnosis present

## 2017-10-17 DIAGNOSIS — Z888 Allergy status to other drugs, medicaments and biological substances status: Secondary | ICD-10-CM | POA: Diagnosis not present

## 2017-10-17 DIAGNOSIS — Z79899 Other long term (current) drug therapy: Secondary | ICD-10-CM

## 2017-10-17 DIAGNOSIS — R5383 Other fatigue: Secondary | ICD-10-CM | POA: Diagnosis present

## 2017-10-17 LAB — I-STAT TROPONIN, ED: TROPONIN I, POC: 0 ng/mL (ref 0.00–0.08)

## 2017-10-17 LAB — I-STAT CHEM 8, ED
BUN: 13 mg/dL (ref 6–20)
Calcium, Ion: 1.13 mmol/L — ABNORMAL LOW (ref 1.15–1.40)
Chloride: 105 mmol/L (ref 98–111)
Creatinine, Ser: 0.9 mg/dL (ref 0.44–1.00)
Glucose, Bld: 101 mg/dL — ABNORMAL HIGH (ref 70–99)
HEMATOCRIT: 40 % (ref 36.0–46.0)
HEMOGLOBIN: 13.6 g/dL (ref 12.0–15.0)
Potassium: 4 mmol/L (ref 3.5–5.1)
SODIUM: 140 mmol/L (ref 135–145)
TCO2: 20 mmol/L — AB (ref 22–32)

## 2017-10-17 LAB — COMPREHENSIVE METABOLIC PANEL
AG Ratio: 1.2 (calc) (ref 1.0–2.5)
ALKALINE PHOSPHATASE (APISO): 84 U/L (ref 33–115)
ALT: 12 U/L (ref 6–29)
AST: 14 U/L (ref 10–30)
Albumin: 4.4 g/dL (ref 3.6–5.1)
BILIRUBIN TOTAL: 0.4 mg/dL (ref 0.2–1.2)
BUN: 11 mg/dL (ref 7–25)
CALCIUM: 10 mg/dL (ref 8.6–10.2)
CHLORIDE: 104 mmol/L (ref 98–110)
CO2: 25 mmol/L (ref 20–32)
CREATININE: 0.86 mg/dL (ref 0.50–1.10)
Globulin: 3.7 g/dL (calc) (ref 1.9–3.7)
Glucose, Bld: 92 mg/dL (ref 65–99)
Potassium: 4.9 mmol/L (ref 3.5–5.3)
Sodium: 140 mmol/L (ref 135–146)
Total Protein: 8.1 g/dL (ref 6.1–8.1)

## 2017-10-17 LAB — CBC
HCT: 41.5 % (ref 35.0–45.0)
Hemoglobin: 14.3 g/dL (ref 11.7–15.5)
MCH: 32.7 pg (ref 27.0–33.0)
MCHC: 34.5 g/dL (ref 32.0–36.0)
MCV: 95 fL (ref 80.0–100.0)
MPV: 10.2 fL (ref 7.5–12.5)
PLATELETS: 347 10*3/uL (ref 140–400)
RBC: 4.37 10*6/uL (ref 3.80–5.10)
RDW: 11.2 % (ref 11.0–15.0)
WBC: 8.8 10*3/uL (ref 3.8–10.8)

## 2017-10-17 LAB — CBC WITH DIFFERENTIAL/PLATELET
ABS IMMATURE GRANULOCYTES: 0 10*3/uL (ref 0.0–0.1)
BASOS ABS: 0 10*3/uL (ref 0.0–0.1)
BASOS PCT: 0 %
Eosinophils Absolute: 0.1 10*3/uL (ref 0.0–0.7)
Eosinophils Relative: 1 %
HCT: 41.6 % (ref 36.0–46.0)
Hemoglobin: 13.7 g/dL (ref 12.0–15.0)
IMMATURE GRANULOCYTES: 1 %
Lymphocytes Relative: 26 %
Lymphs Abs: 2 10*3/uL (ref 0.7–4.0)
MCH: 32.4 pg (ref 26.0–34.0)
MCHC: 32.9 g/dL (ref 30.0–36.0)
MCV: 98.3 fL (ref 78.0–100.0)
MONOS PCT: 6 %
Monocytes Absolute: 0.4 10*3/uL (ref 0.1–1.0)
Neutro Abs: 5.2 10*3/uL (ref 1.7–7.7)
Neutrophils Relative %: 66 %
PLATELETS: 321 10*3/uL (ref 150–400)
RBC: 4.23 MIL/uL (ref 3.87–5.11)
RDW: 11.6 % (ref 11.5–15.5)
WBC: 7.8 10*3/uL (ref 4.0–10.5)

## 2017-10-17 LAB — IRON,TIBC AND FERRITIN PANEL
%SAT: 6 % — AB (ref 16–45)
FERRITIN: 185 ng/mL — AB (ref 16–154)
IRON: 25 ug/dL — AB (ref 40–190)
TIBC: 393 mcg/dL (calc) (ref 250–450)

## 2017-10-17 LAB — ECHOCARDIOGRAM COMPLETE
HEIGHTINCHES: 63 in
WEIGHTICAEL: 2048 [oz_av]

## 2017-10-17 LAB — MRSA PCR SCREENING: MRSA BY PCR: POSITIVE — AB

## 2017-10-17 LAB — TSH: TSH: 1.29 m[IU]/L

## 2017-10-17 LAB — D-DIMER, QUANTITATIVE (NOT AT ARMC): D DIMER QUANT: 4.3 ug{FEU}/mL — AB (ref <0.50)

## 2017-10-17 LAB — ANTITHROMBIN III: ANTITHROMB III FUNC: 93 % (ref 75–120)

## 2017-10-17 LAB — BRAIN NATRIURETIC PEPTIDE: B Natriuretic Peptide: 33.4 pg/mL (ref 0.0–100.0)

## 2017-10-17 LAB — TROPONIN I: Troponin I: <0.03 ng/mL (ref <0.03)

## 2017-10-17 LAB — HEPARIN LEVEL (UNFRACTIONATED): Heparin Unfractionated: <0.1 IU/mL — ABNORMAL LOW (ref 0.30–0.70)

## 2017-10-17 LAB — POC URINE PREG, ED: PREG TEST UR: NEGATIVE

## 2017-10-17 MED ORDER — HEPARIN BOLUS VIA INFUSION
1500.0000 [IU] | Freq: Once | INTRAVENOUS | Status: AC
  Administered 2017-10-17: 1500 [IU] via INTRAVENOUS
  Filled 2017-10-17: qty 1500

## 2017-10-17 MED ORDER — IOPAMIDOL (ISOVUE-370) INJECTION 76%
INTRAVENOUS | Status: AC
  Administered 2017-10-17: 100 mL
  Filled 2017-10-17: qty 100

## 2017-10-17 MED ORDER — CHLORHEXIDINE GLUCONATE CLOTH 2 % EX PADS
6.0000 | MEDICATED_PAD | Freq: Every day | CUTANEOUS | Status: DC
  Administered 2017-10-18 – 2017-10-19 (×2): 6 via TOPICAL

## 2017-10-17 MED ORDER — SODIUM CHLORIDE 0.9% FLUSH
3.0000 mL | Freq: Two times a day (BID) | INTRAVENOUS | Status: DC
  Administered 2017-10-17 – 2017-10-18 (×3): 3 mL via INTRAVENOUS

## 2017-10-17 MED ORDER — MUPIROCIN 2 % EX OINT
1.0000 "application " | TOPICAL_OINTMENT | Freq: Two times a day (BID) | CUTANEOUS | Status: DC
  Administered 2017-10-17 – 2017-10-18 (×3): 1 via NASAL
  Filled 2017-10-17: qty 22

## 2017-10-17 MED ORDER — ACETAMINOPHEN 325 MG PO TABS: 650.0000 mg | ORAL_TABLET | Freq: Four times a day (QID) | ORAL | Status: DC | PRN

## 2017-10-17 MED ORDER — ONDANSETRON HCL 4 MG PO TABS: 4.0000 mg | ORAL_TABLET | Freq: Four times a day (QID) | ORAL | Status: DC | PRN

## 2017-10-17 MED ORDER — ONDANSETRON HCL 4 MG/2ML IJ SOLN: 4.0000 mg | Freq: Four times a day (QID) | INTRAMUSCULAR | Status: DC | PRN

## 2017-10-17 MED ORDER — HEPARIN SODIUM (PORCINE) 5000 UNIT/ML IJ SOLN
60.0000 [IU]/kg | Freq: Once | INTRAMUSCULAR | Status: AC
Start: 2017-10-17 — End: 2017-10-17
  Administered 2017-10-17: 3500 [IU] via INTRAVENOUS
  Filled 2017-10-17: qty 1

## 2017-10-17 MED ORDER — HEPARIN (PORCINE) IN NACL 100-0.45 UNIT/ML-% IJ SOLN
1300.0000 [IU]/h | INTRAMUSCULAR | Status: DC
  Administered 2017-10-17: 1000 [IU]/h via INTRAVENOUS
  Administered 2017-10-18 – 2017-10-19 (×2): 1300 [IU]/h via INTRAVENOUS
  Filled 2017-10-17 (×3): qty 250

## 2017-10-17 MED ORDER — ACETAMINOPHEN 650 MG RE SUPP: 650.0000 mg | Freq: Four times a day (QID) | RECTAL | Status: DC | PRN

## 2017-10-17 MED ORDER — BENZONATATE 100 MG PO CAPS
100.0000 mg | ORAL_CAPSULE | Freq: Once | ORAL | Status: AC
  Administered 2017-10-17: 100 mg via ORAL
  Filled 2017-10-17: qty 1

## 2017-10-17 MED ORDER — KETOROLAC TROMETHAMINE 15 MG/ML IJ SOLN: 15.0000 mg | Freq: Four times a day (QID) | INTRAMUSCULAR | Status: DC | PRN

## 2017-10-17 NOTE — Progress Notes (Signed)
ANTICOAGULATION CONSULT NOTE - Initial Consult  Pharmacy Consult for Heparin Indication: pulmonary embolus  Allergies  Allergen Reactions  . Sulfa Antibiotics Anaphylaxis and Hives  . Erythromycin Other (See Comments)    unknown As a child, unknown reaction  . Morphine Other (See Comments)    aggressiveness  . Morphine And Related Itching and Other (See Comments)    hallucinations    Patient Measurements: Height: 5\' 3"  (160 cm) Weight: 128 lb (58.1 kg) IBW/kg (Calculated) : 52.4  Vital Signs: Temp: 98.6 F (37 C) (06/26 0943) Temp Source: Oral (06/26 0943) BP: 104/69 (06/26 1100) Pulse Rate: 65 (06/26 1100)  Labs: Recent Labs    10/16/17 1454 10/17/17 0934 10/17/17 0945  HGB 14.3 13.7 13.6  HCT 41.5 41.6 40.0  PLT 347 321  --   CREATININE 0.86  --  0.90    Estimated Creatinine Clearance: 68.7 mL/min (by C-G formula based on SCr of 0.9 mg/dL).   Medical History: Past Medical History:  Diagnosis Date  . AMA (advanced maternal age) multigravida 35+   . Arrhythmia    Mobitz type 1  . Chicken pox   . Depression   . Depression with anxiety   . Frequent headaches   . Heart murmur    as a child  . Tachycardia    as a child  . Thyroid disease   . Urinary incontinence     Assessment: 42 yo female who recently started a new birth control pill, admitted with SOB and PE. Starting on Heparin drip.  Goal of Therapy:  Heparin level 0.3-0.7 units/ml Monitor platelets by anticoagulation protocol: Yes   Plan:  Give 3500 units bolus x 1 Start heparin infusion at 1000 units/hr Check anti-Xa level in 6 hours and daily while on heparin Continue to monitor H&H and platelets  Tera Mater, PharmD, FCCM 10/17/2017,11:20 AM

## 2017-10-17 NOTE — ED Notes (Signed)
Echo at bedside

## 2017-10-17 NOTE — ED Provider Notes (Signed)
  I performed a history and physical examination of [name] [name] and discussed her management with [name] [name]  I agree with the history, physical, assessment, and plan of care, with the following exceptions: None  I was present for the following procedures: None Time Spent in Critical Care of the patient: None Time spent in discussions with the patient and family: 69  [name] [name] 42 yo female with 3 week history of back pain, dyspnea and cough, now with increased d-dimer done at pcp. PE hemodynamically stable female NAD Right leg slightly larger than left     EKG Interpretation  Date/Time:  Wednesday October 17 2017 09:42:39 EDT Ventricular Rate:  71 PR Interval:    QRS Duration: 92 QT Interval:  430 QTC Calculation: 468 R Axis:   75 Text Interpretation:  Sinus rhythm Baseline wander in lead(s) II Poor R wave progression Confirmed by [name] [name] [phone]) on 10/17/2017 10:31:12 AM      Bilateral PEs with right heart strain noted on CT angios she remains hemodynamically stable here in the ED.  Heparin is initiated.  Consult to hospitalist and pulmonary critical care discussed with patient and advised regarding need for inpatient management   [name] Grizzle, MD 10/17/17 1221

## 2017-10-17 NOTE — ED Notes (Signed)
Pts doctor called PTA and stated that her D-dimer was 4.03

## 2017-10-17 NOTE — Progress Notes (Signed)
ANTICOAGULATION CONSULT NOTE - Follow Up Consult  Pharmacy Consult for Heparin Indication: pulmonary embolus  Allergies  Allergen Reactions  . Sulfa Antibiotics Anaphylaxis and Hives  . Erythromycin Other (See Comments)    unknown As a child, unknown reaction  . Morphine Other (See Comments)    aggressiveness  . Morphine And Related Itching and Other (See Comments)    hallucinations    Patient Measurements: Height: 5\' 3"  (160 cm) Weight: 128 lb (58.1 kg) IBW/kg (Calculated) : 52.4  Vital Signs: Temp: 98.8 F (37.1 C) (06/26 1900) Temp Source: Oral (06/26 1900) BP: 111/70 (06/26 1900) Pulse Rate: 92 (06/26 1900)  Labs: Recent Labs    10/16/17 1454 10/17/17 0934 10/17/17 0945 10/17/17 1221 10/17/17 1739  HGB 14.3 13.7 13.6  --   --   HCT 41.5 41.6 40.0  --   --   PLT 347 321  --   --   --   HEPARINUNFRC  --   --   --   --  <0.10*  CREATININE 0.86  --  0.90  --   --   TROPONINI  --   --   --  <0.03 <0.03    Estimated Creatinine Clearance: 68.7 mL/min (by C-G formula based on SCr of 0.9 mg/dL).   Medical History: Past Medical History:  Diagnosis Date  . AMA (advanced maternal age) multigravida 35+   . Arrhythmia    Mobitz type 1  . Chicken pox   . Depression   . Depression with anxiety   . Frequent headaches   . Heart murmur    as a child  . Tachycardia    as a child  . Thyroid disease   . Urinary incontinence     Assessment: 42 yo female who recently started a new birth control pill, admitted with SOB and PE. Started on IV heparin today. Initial heparin level is undetectable. No s/s of bleeding noted per RN   Goal of Therapy:  Heparin level 0.3-0.7 units/ml Monitor platelets by anticoagulation protocol: Yes   Plan:  Give 1500 units bolus x 1 Increase heparin infusion to 1200 units/hr Check anti-Xa level in 6 hours and daily while on heparin Continue to monitor H&H and platelets  Vinnie Level, PharmD., BCPS Clinical  Pharmacist Clinical phone for 10/17/17 until 10:30pm: [phone] If after 10:30pm, please refer to [facility] for unit-specific pharmacist

## 2017-10-17 NOTE — ED Notes (Signed)
Patient transported to CT 

## 2017-10-17 NOTE — ED Provider Notes (Signed)
[facility] EMERGENCY DEPARTMENT Provider Note   CSN: [id number] Arrival date & time: 10/17/17  1324     History   Chief Complaint Chief Complaint  Patient presents with  . Shortness of Breath    HPI [name] [name] is a 42 y.o. female.  HPI   42 year old female with history of thyroid disease, frequent headaches, depression, tachycardia presented per request of PCP for evaluation of shortness of breath and elevated d-dimer.  Patient reports 2 weeks ago she developed sharp pain to her right posterior back.  Pain worsening with breathing, associate with pleuritic chest pain and shortness of breath.  He she initially thought it was more of a muscle spasm which lasting for about a week and a half.  The past week she now noticed pain to the left side of her chest with increased pelvic pain and increased redness of breath.  Walking a short distance causing her to be out of breath.  She has been coughing persistently with occasional pink sputum.  She finally decided to see her PCP yesterday for work-up.  She was notified today that her d-dimer was elevated and to come to the ER.  She is currently on birth control pills.  She denies any prior history of PE or DVT, no recent surgery, prolonged bedrest, unilateral leg swelling or calf pain.  No report of fever or chills.  No nausea vomiting diarrhea.  She has been taking ibuprofen for her symptoms.   Past Medical History:  Diagnosis Date  . AMA (advanced maternal age) multigravida 35+   . Arrhythmia    Mobitz type 1  . Chicken pox   . Depression   . Depression with anxiety   . Frequent headaches   . Heart murmur    as a child  . Tachycardia    as a child  . Thyroid disease   . Urinary incontinence     Patient Active Problem List   Diagnosis Date Noted  . Tachycardia 10/16/2017  . Heart palpitations 10/05/2016  . Shortness of breath at rest   . Second degree atrioventricular block, Mobitz (type) I   . ANXIETY  07/19/2007  . HYPERTENSION 07/19/2007  . IBS 07/19/2007  . 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 HEADACHE, CHRONIC 07/19/2007    Past Surgical History:  Procedure Laterality Date  . ABDOMINOPLASTY    . BREAST ENHANCEMENT SURGERY    . CESAREAN SECTION    . CESAREAN SECTION Bilateral 11/01/2016   Procedure: CESAREAN SECTION;  Surgeon: Levi Aland, MD;  Location: [facility] B[facility];  Service: Obstetrics;  Laterality: Bilateral;  . KIDNEY SURGERY     congenital abnormality  of ureters  . MANDIBLE SURGERY    . TONSILLECTOMY       OB History    Gravida  6   Para  4   Term  3   Preterm  1   AB  2   Living  4     SAB  2   TAB      Ectopic      Multiple  0   Live Births  4            Home Medications    Prior to Admission medications   Medication Sig Start Date End Date Taking? Authorizing Provider  cephALEXin (KEFLEX) 500 MG capsule Take 1 capsule (500 mg total) by mouth 3 (three) times daily. 10/16/17   Kuneff, Renee A, DO  norgestimate-ethinyl estradiol (  SPRINTEC 28) 0.25-35 MG-MCG tablet norethindrone (contraceptive) 0.35 mg tablet    [provider]  Prenatal Vit-Fe Fumarate-FA (PRENATAL MULTIVITAMIN) TABS tablet Take 1 tablet by mouth daily at 12 noon.    [provider]    Family History Family History  Problem Relation Age of Onset  . Diabetes Mother   . Arthritis Mother   . Depression Mother   . Stroke Mother   . Miscarriages / [place] Mother   . Mental illness Mother   . Hyperlipidemia Father   . Hypertension Father   . Alcohol abuse Father   . Arthritis Father   . Depression Father   . Drug abuse Father   . Hearing loss Father   . Cancer Paternal Aunt   . Cancer Paternal Grandmother   . Ovarian cancer Paternal Grandmother   . Breast cancer Paternal Grandmother   . Mental illness Sister   . Asthma Brother   . Depression Brother   . Asthma Son   . Depression Maternal Grandmother   .  Diabetes Maternal Grandmother   . Heart disease Maternal Grandfather   . Kidney disease Maternal Grandfather   . Stroke Maternal Grandfather   . Alcohol abuse Paternal Grandfather   . Asthma Paternal Grandfather   . Depression Paternal Grandfather   . Drug abuse Paternal Grandfather   . Hearing loss Paternal Grandfather   . Heart disease Paternal Grandfather   . Hyperlipidemia Paternal Grandfather   . Mental illness Paternal Grandfather     Social History Social History   Tobacco Use  . Smoking status: Former Games developer  . Smokeless tobacco: Never Used  Substance Use Topics  . Alcohol use: Yes  . Drug use: No     Allergies   Sulfa antibiotics; Erythromycin; Morphine; and Morphine and related   Review of Systems Review of Systems  All other systems reviewed and are negative.    Physical Exam Updated Vital Signs BP 116/73 (BP Location: Left Arm)   Pulse 97   Temp 98.6 F (37 C) (Oral)   Resp 20   Ht 5\' 3"  (1.6 m)   Wt 58.1 kg (128 lb)   SpO2 97%   BMI 22.67 kg/m   Physical Exam  Constitutional: She appears well-developed and well-nourished. No distress.  HENT:  Head: Atraumatic.  Mouth/Throat: Oropharynx is clear and moist.  Eyes: Conjunctivae are normal.  Neck: Neck supple.  Cardiovascular: Normal rate and regular rhythm. Exam reveals no gallop and no decreased pulses.  No murmur heard. Pulmonary/Chest: Breath sounds normal.  Patient is mildly dyspneic, persistent cough.  Abdominal: Soft. There is no tenderness.  Musculoskeletal:       Right lower leg: She exhibits no edema.       Left lower leg: She exhibits no edema.  Neurological: She is alert.  Skin: No rash noted.  Psychiatric: She has a normal mood and affect.  Nursing note and vitals reviewed.    ED Treatments / Results  Labs (all labs ordered are listed, but only abnormal results are displayed) Labs Reviewed  I-STAT CHEM 8, ED - Abnormal; Notable for the following components:      Result  Value   Glucose, Bld 101 (*)    Calcium, Ion 1.13 (*)    TCO2 20 (*)    All other components within normal limits  CBC WITH DIFFERENTIAL/PLATELET  BRAIN NATRIURETIC PEPTIDE  HEPARIN LEVEL (UNFRACTIONATED)  TROPONIN I  TROPONIN I  TROPONIN I  I-STAT TROPONIN, ED  POC URINE PREG,  ED    EKG EKG Interpretation  Date/Time:  Wednesday October 17 2017 09:42:39 EDT Ventricular Rate:  71 PR Interval:    QRS Duration: 92 QT Interval:  430 QTC Calculation: 468 R Axis:   75 Text Interpretation:  Sinus rhythm Baseline wander in lead(s) II Poor R wave progression Confirmed by Margarita Grizzle [phone]) on 10/17/2017 10:31:12 AM    Date: 10/17/2017  Rate: 71  Rhythm: normal sinus rhythm  QRS Axis: normal  Intervals: normal  ST/T Wave abnormalities: normal  Conduction Disutrbances: none  Narrative Interpretation:   Old EKG Reviewed: No significant changes noted     Radiology Dg Chest 2 View  Result Date: 10/17/2017 CLINICAL DATA:  Shortness of breath and dry cough for several weeks EXAM: CHEST - 2 VIEW COMPARISON:  10/05/2016 FINDINGS: The heart size and mediastinal contours are within normal limits. Both lungs are clear. The visualized skeletal structures are unremarkable. IMPRESSION: No active cardiopulmonary disease. Electronically Signed   By: Alcide Clever M.D.   On: 10/17/2017 07:50   Ct Angio Chest Pe W And/or Wo Contrast  Result Date: 10/17/2017 CLINICAL DATA:  Shortness of breath with persistent symptoms 3 weeks. Elevated D-dimer. EXAM: CT ANGIOGRAPHY CHEST WITH CONTRAST TECHNIQUE: Multidetector CT imaging of the chest was performed using the standard protocol during bolus administration of intravenous contrast. Multiplanar CT image reconstructions and MIPs were obtained to evaluate the vascular anatomy. CONTRAST:  ISOVUE-370 IOPAMIDOL (ISOVUE-370) INJECTION 76% COMPARISON:  None. FINDINGS: Cardiovascular: Heart is normal size. Pulmonary arterial system is well opacified with  moderate emboli over the distal main/proximal lower lobar and lingular pulmonary arteries. Also moderate emboli over the distal right main pulmonary artery extending into the proximal upper and lower lobar pulmonary arteries. RV/LV ratio is 50.8/45.2= 1.12 compatible with right heart strain. Mediastinum/Nodes: No evidence of mediastinal or hilar adenopathy. Remaining mediastinal structures are within normal. Lungs/Pleura: Pleural based focal opacification over the posterior lower lungs bilaterally with central lucency compatible with lung infarct. No effusion. Airways are normal. Upper Abdomen: No acute abnormality. Musculoskeletal: No chest wall abnormality. No acute or significant osseous findings. Review of the MIP images confirms the above findings. IMPRESSION: Moderate burden bilateral pulmonary emboli. Bilateral posterior lower lobe lung infarcts. Positive for acute PE with CT evidence of right heart strain (RV/LV Ratio = 1.12) consistent with at least submassive (intermediate risk) PE. The presence of right heart strain has been associated with an increased risk of morbidity and mortality. Please activate Code PE by paging [phone]. Critical Value/emergent results were called by telephone at the time of interpretation on 10/17/2017 at 10:54 am to Dr. Margarita Grizzle, who verbally acknowledged these results. Electronically Signed   By: Elberta Fortis M.D.   On: 10/17/2017 10:54    Procedures .Critical Care Performed by: Fayrene Helper, PA-C Authorized by: Fayrene Helper, PA-C   Critical care provider statement:    Critical care time (minutes):  35   Critical care start time:  10/17/2017 10:24 AM   Critical care end time:  10/17/2017 11:59 AM   Critical care time was exclusive of:  Separately billable procedures and treating other patients   Critical care was necessary to treat or prevent imminent or life-threatening deterioration of the following conditions:  Respiratory failure   Critical care was time  spent personally by me on the following activities:  Blood draw for specimens, development of treatment plan with patient or surrogate, discussions with consultants, evaluation of patient's response to treatment, examination of patient, obtaining history  from patient or surrogate, ordering and performing treatments and interventions, ordering and review of laboratory studies, ordering and review of radiographic studies, pulse oximetry, re-evaluation of patient's condition and review of old charts   I assumed direction of critical care for this patient from another provider in my specialty: no     (including critical care time)  Medications Ordered in ED Medications  heparin ADULT infusion 100 units/mL (25000 units/276mL sodium chloride 0.45%) (has no administration in time range)  benzonatate (TESSALON) capsule 100 mg (100 mg Oral Given 10/17/17 1039)  iopamidol (ISOVUE-370) 76 % injection (100 mLs  Contrast Given 10/17/17 1020)  heparin injection 3,500 Units (3,500 Units Intravenous Given 10/17/17 1118)     Initial Impression / Assessment and Plan / ED Course  I have reviewed the triage vital signs and the nursing notes.  Pertinent labs & imaging results that were available during my care of the patient were reviewed by me and considered in my medical decision making (see chart for details).     BP 111/72   Pulse 77   Temp 98.6 F (37 C) (Oral)   Resp 20   Ht 5\' 3"  (1.6 m)   Wt 58.1 kg (128 lb)   SpO2 99%   BMI 22.67 kg/m    Final Clinical Impressions(s) / ED Diagnoses   Final diagnoses:  Bilateral pulmonary embolism [facility])    ED Discharge Orders    None     10:24 AM Patient developed pleuritic chest pain ongoing for the past 3 weeks.  She was seen by PCP yesterday, has an elevated d-dimer 4.07 and sent here for further work-up.  She is currently on oral hormone which increases her risk of PE.  No other risk factor noted.  She is afebrile.  Chest CT angiogram obtained. Care  discussed with Dr. Rosalia Hammers. Pt is hemodynamically stable.    11:09 AM Chest CT angiogram shows moderate burden bilateral pulmonary embolism with bilateral posterior lower lung infarcts.  Positive for acute PE with CT evidence of right heart strain consistent with at least submassive PE.  This finding was discussed with patient.  Will initiated heparin.  Will consult for admission. Pt made aware of finding and understand the plan.    11:52 AM Appreciate consultation from [facility] Jill Side, NP who agrees to admit pt.  She request pulmonary-critical care intensivist to be notify due to R heart strain.   12:19 PM Appreciate consultation from Intensivist who agrees to be involve in pt care.      Fayrene Helper, PA-C 10/17/17 1219    Margarita Grizzle, MD 10/17/17 [phone]

## 2017-10-17 NOTE — ED Triage Notes (Signed)
Patient presents ambulatory obviously short of breath. Patient states symptoms have persisted for 3 weeks. Patient states pain started as back pain went to PCP and had blood work showing elevated D-dimer. Family at bedside.

## 2017-10-17 NOTE — Progress Notes (Signed)
  Echocardiogram 2D Echocardiogram has been performed.  Celene Skeen 10/17/2017, 3:40 PM

## 2017-10-17 NOTE — ED Notes (Signed)
ED TO INPATIENT HANDOFF REPORT  Name/Age/Gender [name] [name] 42 y.o. female  Code Status    Code Status Orders  (From admission, onward)        Start     Ordered   10/17/17 1156  Full code  Continuous     10/17/17 1157    Code Status History    Date Active Date Inactive Code Status Order ID Comments User Context   11/01/2016 0422 11/03/2016 1415 Full Code [id number]  Levi Aland, MD Inpatient   10/05/2016 2035 10/06/2016 1908 Full Code [id number]  Darnelle Going ED      Home/SNF/Other Home  Chief Complaint Pulmonary Embolism  Level of Care/Admitting Diagnosis ED Disposition    ED Disposition Condition Comment   Admit  Hospital Area: [facility] [[id number]]  Level of Care: Stepdown [14]  Diagnosis: Bilateral pulmonary embolism [facility]) [[id number]]  Admitting Physician: Jonah Blue [[id number]]  Attending Physician: Jonah Blue [[id number]]  Estimated length of stay: past midnight tomorrow  Certification:: I certify this patient will need inpatient services for at least 2 midnights  PT Class (Do Not Modify): Inpatient [101]  PT Acc Code (Do Not Modify): Private [1]       Medical History Past Medical History:  Diagnosis Date  . AMA (advanced maternal age) multigravida 35+   . Arrhythmia    Mobitz type 1  . Chicken pox   . Depression   . Depression with anxiety   . Frequent headaches   . Heart murmur    as a child  . Tachycardia    as a child  . Thyroid disease   . Urinary incontinence     Allergies Allergies  Allergen Reactions  . Sulfa Antibiotics Anaphylaxis and Hives  . Erythromycin Other (See Comments)    unknown As a child, unknown reaction  . Morphine Other (See Comments)    aggressiveness  . Morphine And Related Itching and Other (See Comments)    hallucinations    IV Location/Drains/Wounds Patient Lines/Drains/Airways Status   Active Line/Drains/Airways    Name:   Placement date:   Placement time:   Site:   Days:   Peripheral IV 10/17/17 Right Antecubital   10/17/17    0945    Antecubital   less than 1   Peripheral IV 10/17/17 Right Wrist   10/17/17    1224    Wrist   less than 1   Incision (Closed) 11/01/16 Abdomen Other (Comment)   11/01/16    0151     350   Incision (Closed) 11/01/16 Perineum Other (Comment)   11/01/16    0222     350          Labs/Imaging Results for orders placed or performed during the hospital encounter of 10/17/17 (from the past 48 hour(s))  CBC with Differential     Status: None   Collection Time: 10/17/17  9:34 AM  Result Value Ref Range   WBC 7.8 4.0 - 10.5 K/uL   RBC 4.23 3.87 - 5.11 MIL/uL   Hemoglobin 13.7 12.0 - 15.0 g/dL   HCT 72.6 20.3 - 55.9 %   MCV 98.3 78.0 - 100.0 fL   MCH 32.4 26.0 - 34.0 pg   MCHC 32.9 30.0 - 36.0 g/dL   RDW 74.1 63.8 - 45.3 %   Platelets 321 150 - 400 K/uL   Neutrophils Relative % 66 %   Neutro Abs 5.2 1.7 - 7.7 K/uL   Lymphocytes Relative 26 %  Lymphs Abs 2.0 0.7 - 4.0 K/uL   Monocytes Relative 6 %   Monocytes Absolute 0.4 0.1 - 1.0 K/uL   Eosinophils Relative 1 %   Eosinophils Absolute 0.1 0.0 - 0.7 K/uL   Basophils Relative 0 %   Basophils Absolute 0.0 0.0 - 0.1 K/uL   Immature Granulocytes 1 %   Abs Immature Granulocytes 0.0 0.0 - 0.1 K/uL    Comment: Performed at [facility], [address], [place], [place] [address]  Brain natriuretic peptide     Status: None   Collection Time: 10/17/17  9:34 AM  Result Value Ref Range   B Natriuretic Peptide 33.4 0.0 - 100.0 pg/mL    Comment: Performed at [facility], [address] [place], [place] [address]  I-stat troponin, ED     Status: None   Collection Time: 10/17/17  9:43 AM  Result Value Ref Range   Troponin i, poc 0.00 0.00 - 0.08 ng/mL   Comment 3            Comment: Due to the release kinetics of cTnI, a negative result within the first hours of the onset of symptoms does not rule out myocardial infarction with certainty. If myocardial infarction is  still suspected, repeat the test at appropriate intervals.   I-stat Chem 8, ED     Status: Abnormal   Collection Time: 10/17/17  9:45 AM  Result Value Ref Range   Sodium 140 135 - 145 mmol/L   Potassium 4.0 3.5 - 5.1 mmol/L   Chloride 105 98 - 111 mmol/L   BUN 13 6 - 20 mg/dL   Creatinine, Ser 0.98 0.44 - 1.00 mg/dL   Glucose, Bld 119 (H) 70 - 99 mg/dL   Calcium, Ion 1.47 (L) 1.15 - 1.40 mmol/L   TCO2 20 (L) 22 - 32 mmol/L   Hemoglobin 13.6 12.0 - 15.0 g/dL   HCT 82.9 56.2 - 13.0 %  POC urine preg, ED (not at [facility])     Status: None   Collection Time: 10/17/17 10:08 AM  Result Value Ref Range   Preg Test, Ur NEGATIVE NEGATIVE    Comment:        THE SENSITIVITY OF THIS METHODOLOGY IS >24 mIU/mL   Troponin I (q 6hr x 3)     Status: None   Collection Time: 10/17/17 12:21 PM  Result Value Ref Range   Troponin I <0.03 <0.03 ng/mL    Comment: Performed at [facility], [address], [place], [place] [address]  Antithrombin III     Status: None   Collection Time: 10/17/17  3:47 PM  Result Value Ref Range   AntiThromb III Func 93 75 - 120 %    Comment: Performed at [facility], [address] [place], [place] [address]   Dg Chest 2 View  Result Date: 10/17/2017 CLINICAL DATA:  Shortness of breath and dry cough for several weeks EXAM: CHEST - 2 VIEW COMPARISON:  10/05/2016 FINDINGS: The heart size and mediastinal contours are within normal limits. Both lungs are clear. The visualized skeletal structures are unremarkable. IMPRESSION: No active cardiopulmonary disease. Electronically Signed   By: Alcide Clever M.D.   On: 10/17/2017 07:50   Ct Angio Chest Pe W And/or Wo Contrast  Result Date: 10/17/2017 CLINICAL DATA:  Shortness of breath with persistent symptoms 3 weeks. Elevated D-dimer. EXAM: CT ANGIOGRAPHY CHEST WITH CONTRAST TECHNIQUE: Multidetector CT imaging of the chest was performed using the  standard protocol during bolus administration of intravenous contrast.  Multiplanar CT image reconstructions and MIPs were obtained to evaluate the vascular anatomy. CONTRAST:  ISOVUE-370 IOPAMIDOL (ISOVUE-370) INJECTION 76% COMPARISON:  None. FINDINGS: Cardiovascular: Heart is normal size. Pulmonary arterial system is well opacified with moderate emboli over the distal main/proximal lower lobar and lingular pulmonary arteries. Also moderate emboli over the distal right main pulmonary artery extending into the proximal upper and lower lobar pulmonary arteries. RV/LV ratio is 50.8/45.2= 1.12 compatible with right heart strain. Mediastinum/Nodes: No evidence of mediastinal or hilar adenopathy. Remaining mediastinal structures are within normal. Lungs/Pleura: Pleural based focal opacification over the posterior lower lungs bilaterally with central lucency compatible with lung infarct. No effusion. Airways are normal. Upper Abdomen: No acute abnormality. Musculoskeletal: No chest wall abnormality. No acute or significant osseous findings. Review of the MIP images confirms the above findings. IMPRESSION: Moderate burden bilateral pulmonary emboli. Bilateral posterior lower lobe lung infarcts. Positive for acute PE with CT evidence of right heart strain (RV/LV Ratio = 1.12) consistent with at least submassive (intermediate risk) PE. The presence of right heart strain has been associated with an increased risk of morbidity and mortality. Please activate Code PE by paging [phone]. Critical Value/emergent results were called by telephone at the time of interpretation on 10/17/2017 at 10:54 am to Dr. Margarita Grizzle, who verbally acknowledged these results. Electronically Signed   By: Elberta Fortis M.D.   On: 10/17/2017 10:54    Pending Labs Unresulted Labs (From admission, onward)   Start     Ordered   10/18/17 0500  Heparin level (unfractionated)  Daily,   R     10/17/17 1118   10/18/17 0500  CBC  Tomorrow morning,   R     10/17/17 1157   10/18/17 0500  Basic metabolic panel   Tomorrow morning,   R     10/17/17 1157   10/17/17 1800  Heparin level (unfractionated)  Once,   R     10/17/17 1118   10/17/17 1302  Protein C activity  (Hypercoagulable Panel, Comprehensive (PNL))  Once,   R     10/17/17 1320   10/17/17 1302  Protein C, total  (Hypercoagulable Panel, Comprehensive (PNL))  Once,   R     10/17/17 1320   10/17/17 1302  Protein S activity  (Hypercoagulable Panel, Comprehensive (PNL))  Once,   R     10/17/17 1320   10/17/17 1302  Protein S, total  (Hypercoagulable Panel, Comprehensive (PNL))  Once,   R     10/17/17 1320   10/17/17 1302  Lupus anticoagulant panel  (Hypercoagulable Panel, Comprehensive (PNL))  Once,   R     10/17/17 1320   10/17/17 1302  Beta-2-glycoprotein i abs, IgG/M/A  (Hypercoagulable Panel, Comprehensive (PNL))  Once,   R     10/17/17 1320   10/17/17 1302  Homocysteine, serum  (Hypercoagulable Panel, Comprehensive (PNL))  Once,   R     10/17/17 1320   10/17/17 1302  Factor 5 leiden  (Hypercoagulable Panel, Comprehensive (PNL))  Once,   R     10/17/17 1320   10/17/17 1302  Prothrombin gene mutation  (Hypercoagulable Panel, Comprehensive (PNL))  Once,   R     10/17/17 1320   10/17/17 1302  Cardiolipin antibodies, IgG, IgM, IgA  (Hypercoagulable Panel, Comprehensive (PNL))  Once,   R     10/17/17 1320   10/17/17 1156  HIV antibody (Routine Testing)  Once,   R  10/17/17 1157   10/17/17 1152  Troponin I (q 6hr x 3)  Now then every 6 hours,   R     10/17/17 1151      Vitals/Pain Today's Vitals   10/17/17 1430 10/17/17 1500 10/17/17 1530 10/17/17 1600  BP: 110/71 113/79 111/64 115/66  Pulse: 95 89 84 90  Resp: 19 20 20 17   Temp:      TempSrc:      SpO2: 98% 100% 98% 96%  Weight:      Height:      PainSc:        Isolation Precautions No active isolations  Medications Medications  heparin ADULT infusion 100 units/mL (25000 units/214mL sodium chloride 0.45%) (1,000 Units/hr Intravenous New Bag/Given 10/17/17 1225)  sodium  chloride flush (NS) 0.9 % injection 3 mL (3 mLs Intravenous Given 10/17/17 1225)  acetaminophen (TYLENOL) tablet 650 mg (has no administration in time range)    Or  acetaminophen (TYLENOL) suppository 650 mg (has no administration in time range)  ondansetron (ZOFRAN) tablet 4 mg (has no administration in time range)    Or  ondansetron (ZOFRAN) injection 4 mg (has no administration in time range)  ketorolac (TORADOL) 15 MG/ML injection 15 mg (has no administration in time range)  benzonatate (TESSALON) capsule 100 mg (100 mg Oral Given 10/17/17 1039)  iopamidol (ISOVUE-370) 76 % injection (100 mLs  Contrast Given 10/17/17 1020)  heparin injection 3,500 Units (3,500 Units Intravenous Given 10/17/17 1118)    Mobility walks

## 2017-10-17 NOTE — Telephone Encounter (Signed)
Pt was contacted about labs. D-dimer 4.30, cough > 3 weeks. CXR read as normal. Concern for PE or heart condition causing her to be winded. She is very winded again today and is having difficulty speaking in full sentences. Advised pt to go to ED, she will go immediately to [facility] ED. Triage nurse at [facility] notified Mardella Layman).

## 2017-10-17 NOTE — Consult Note (Addendum)
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  The patient went to her PCP for UTI symptoms and informed them of above complaints and a D-dimer was ordered that was positive.  Patient was called and instructed to go to [facility] for evaluation.  CT of the chest revealed bilateral PEs with a moderate burden.  Patient "desaturated" to 95% with exertion and PCCM was consulted for a PE.  No recent travel of periods of immobility.  Does report some hemoptysis.  No family history of blood clots and not previous personal history of blood clots of bleeding diathesis.  No personal history of cancer.  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  The patient went to her PCP for UTI symptoms and informed them of above complaints and a D-dimer was ordered that was positive.  Patient was called and instructed to go to [facility] for evaluation.  CT of the chest revealed bilateral PEs with a moderate burden.  Patient "desaturated" to 95% with exertion and PCCM was consulted for a PE.  No recent travel of periods of immobility.  Does report some hemoptysis.  No family history of blood clots and not previous personal history of blood clots of bleeding diathesis.  No personal history of cancer.  PAST MEDICAL HISTORY :   has a past medical history of AMA (advanced maternal age) multigravida 35+, Arrhythmia, Chicken pox, Depression, Depression with anxiety,  Frequent headaches, Heart murmur, Tachycardia, Thyroid disease, and Urinary incontinence.  has a past surgical history that includes Cesarean section; Kidney surgery; Breast enhancement surgery; Mandible surgery; Cesarean section (Bilateral, 11/01/2016); Abdominoplasty; and Tonsillectomy.   Prior to Admission medications   Medication Sig Start Date End Date Taking? Authorizing Provider  cephALEXin (KEFLEX) 500 MG capsule Take 1 capsule (500 mg total) by mouth 3 (three) times daily. 10/16/17   Kuneff, Renee A, DO  norgestimate-ethinyl estradiol (SPRINTEC 28) 0.25-35 MG-MCG tablet norethindrone (contraceptive) 0.35 mg tablet    [provider]  Prenatal Vit-Fe Fumarate-FA (PRENATAL MULTIVITAMIN) TABS tablet Take 1 tablet by mouth daily at 12 noon.    [provider]   Allergies  Allergen Reactions  . Sulfa Antibiotics Anaphylaxis and Hives  . Erythromycin Other (See Comments)    unknown As a child, unknown reaction  . Morphine Other (See Comments)    aggressiveness  . Morphine And Related Itching and Other (See Comments)    hallucinations   FAMILY HISTORY:  family history includes Alcohol abuse in her father and paternal grandfather; Arthritis in her father and mother; Asthma in her brother, paternal grandfather, and son; Breast cancer in her paternal grandmother; Cancer in her paternal aunt and paternal grandmother; Depression in her brother, father, maternal grandmother, mother, and paternal grandfather; Diabetes in her maternal grandmother and mother; Drug abuse in her father and paternal grandfather; Hearing loss in her father and paternal grandfather; Heart disease in  her maternal grandfather and paternal grandfather; Hyperlipidemia in her father and paternal grandfather; Hypertension in her father; Kidney disease in her maternal grandfather; Mental illness in her mother, paternal grandfather, and sister; Miscarriages / Stillbirths in her mother; Ovarian cancer in her paternal  grandmother; Stroke in her maternal grandfather and mother. SOCIAL HISTORY:  reports that she has quit smoking. She has never used smokeless tobacco. She reports that she drinks alcohol. She reports that she does not use drugs.  REVIEW OF SYSTEMS:   Constitutional: Negative for fever, chills, weight loss, malaise/fatigue and diaphoresis.  HENT: Negative for hearing loss, ear pain, nosebleeds, congestion, sore throat, neck pain, tinnitus and ear discharge.   Eyes: Negative for blurred vision, double vision, photophobia, pain, discharge and redness.  Respiratory: Negative for cough, hemoptysis, sputum production, shortness of breath, wheezing and stridor.   Cardiovascular: Negative for chest pain, palpitations, orthopnea, claudication, leg swelling and PND.  Gastrointestinal: Negative for heartburn, nausea, vomiting, abdominal pain, diarrhea, constipation, blood in stool and melena.  Genitourinary: Negative for dysuria, urgency, frequency, hematuria and flank pain.  Musculoskeletal: Negative for myalgias, back pain, joint pain and falls.  Skin: Negative for itching and rash.  Neurological: Negative for dizziness, tingling, tremors, sensory change, speech change, focal weakness, seizures, loss of consciousness, weakness and headaches.  Endo/Heme/Allergies: Negative for environmental allergies and polydipsia. Does not bruise/bleed easily.  SUBJECTIVE:  SOB, cough and DOE with back pain  VITAL SIGNS: Temp:  [98.6 F (37 C)-99.1 F (37.3 C)] 98.6 F (37 C) (06/26 0943) Pulse Rate:  [63-98] 79 (06/26 1230) Resp:  [20-25] 25 (06/26 1230) BP: (104-127)/(69-84) 127/80 (06/26 1230) SpO2:  [95 %-100 %] 97 % (06/26 1230) Weight:  [128 lb (58.1 kg)] 128 lb (58.1 kg) (06/26 0927)  PHYSICAL EXAMINATION: General:  Well appearing, NAD Neuro:  Alert and interactive, moving all ext to command HEENT:  [place]/AT, PERRL, EOM-I and MMM Cardiovascular:  RRR, Nl S1/S2 and -M/R/G Lungs:  CTA  bilaterally Abdomen:  Soft, NT, ND and +BS Musculoskeletal:  -edema and -tenderness, no cords Skin:  Intact  Recent Labs  Lab 10/16/17 1454 10/17/17 0945  NA 140 140  K 4.9 4.0  CL 104 105  CO2 25  --   BUN 11 13  CREATININE 0.86 0.90  GLUCOSE 92 101*   Recent Labs  Lab 10/16/17 1454 10/17/17 0934 10/17/17 0945  HGB 14.3 13.7 13.6  HCT 41.5 41.6 40.0  WBC 8.8 7.8  --   PLT 347 321  --    Dg Chest 2 View  Result Date: 10/17/2017 CLINICAL DATA:  Shortness of breath and dry cough for several weeks EXAM: CHEST - 2 VIEW COMPARISON:  10/05/2016 FINDINGS: The heart size and mediastinal contours are within normal limits. Both lungs are clear. The visualized skeletal structures are unremarkable. IMPRESSION: No active cardiopulmonary disease. Electronically Signed   By: Alcide Clever M.D.   On: 10/17/2017 07:50   Ct Angio Chest Pe W And/or Wo Contrast  Result Date: 10/17/2017 CLINICAL DATA:  Shortness of breath with persistent symptoms 3 weeks. Elevated D-dimer. EXAM: CT ANGIOGRAPHY CHEST WITH CONTRAST TECHNIQUE: Multidetector CT imaging of the chest was performed using the standard protocol during bolus administration of intravenous contrast. Multiplanar CT image reconstructions and MIPs were obtained to evaluate the vascular anatomy. CONTRAST:  ISOVUE-370 IOPAMIDOL (ISOVUE-370) INJECTION 76% COMPARISON:  None. FINDINGS: Cardiovascular: Heart is normal size. Pulmonary arterial system is well opacified with moderate emboli over the distal main/proximal lower lobar and lingular pulmonary  arteries. Also moderate emboli over the distal right main pulmonary artery extending into the proximal upper and lower lobar pulmonary arteries. RV/LV ratio is 50.8/45.2= 1.12 compatible with right heart strain. Mediastinum/Nodes: No evidence of mediastinal or hilar adenopathy. Remaining mediastinal structures are within normal. Lungs/Pleura: Pleural based focal opacification over the posterior lower lungs  bilaterally with central lucency compatible with lung infarct. No effusion. Airways are normal. Upper Abdomen: No acute abnormality. Musculoskeletal: No chest wall abnormality. No acute or significant osseous findings. Review of the MIP images confirms the above findings. IMPRESSION: Moderate burden bilateral pulmonary emboli. Bilateral posterior lower lobe lung infarcts. Positive for acute PE with CT evidence of right heart strain (RV/LV Ratio = 1.12) consistent with at least submassive (intermediate risk) PE. The presence of right heart strain has been associated with an increased risk of morbidity and mortality. Please activate Code PE by paging [phone]. Critical Value/emergent results were called by telephone at the time of interpretation on 10/17/2017 at 10:54 am to Dr. Margarita Grizzle, who verbally acknowledged these results. Electronically Signed   By: Elberta Fortis M.D.   On: 10/17/2017 10:54   I reviewed CT of the chest myself, bilateral PEs noted and small areas of what appears to be pulmonary infarcts.  ASSESSMENT / PLAN:  42 year old female with no previous vascular or pulmonary history who presents to PCCM with an acute PE, first insult with associated mild hemoptysis (blood mixed in sputum this AM) and 2 areas of pulmonary infarcts.  Discussed with EDP.  PE:  - Lower ext dopplers  - Hypercoagulable panel ordered  - Will need repeat of panel after patient is off heparin as some of the lab values can be distorted  - No need for lytics given clinical status  - If there is a mobile clot in the lower ext may need consider IVC filter but highly doubtful  Hypoxemia:  - Titrate O2 for sat of 92-95%  - Doubt will need home O2  Anticoagulation:  - Will need anti-coagulation for 3-6 months  - Start heparin now  - Discussed with patient option of coumadin vs NOAC and she will read about that.  Pulmonary infarct:  - Monitor clinically  Pleurisy:  - Anti-inflammatory meds only if  needed  Hemoptysis:  - Monitor clinically  - If worsens on heparin please call PCCM back  PCCM will sign off, please call back if needed.  Alyson Reedy, M.D. [facility] Pulmonary/Critical Care Medicine. Pager: [phone]. After hours pager: [phone].  10/17/2017, 1:01 PM

## 2017-10-17 NOTE — H&P (Signed)
History and Physical    [name] [name] WHQ:[id number] DOB: 11-03-1975 DOA: 10/17/2017  **Will admit patient based on the expectation that the patient will need hospitalization/ hospital care that crosses at least 2 midnights  PCP: [name], DO   Attending physician: Lorin Mercy  Patient coming from/Resides with: Private residence/husband and children  Chief Complaint: Non-productive cough, DOE x3 weeks  HPI: [name] [name] is a 42 y.o. female with medical history significant for Mobitz type I heart block during pregnancy, childhood murmur.  Patient reports a 3-week history of nonproductive cough and profound dyspnea on exertion.  No recent travel by vehicle or plane.  No lower extremity swelling.  Patient has 42-year-old baby and up until 4 weeks ago had been utilizing progesterone birth control pills and recently switch to estrogen-based birth control pills.  Patient was evaluated by PCP yesterday because of this urinary and urinary frequency as well as above-stated symptoms.  Was started on Keflex.  Multiple labs were obtained including a d-dimer which was found to be positive.  Her symptoms PCP contacted her this morning and instructed her to present to the [facility], ER for further evaluation and treatment.  With ambulation patient was noted to be profoundly dyspneic but maintain O2 sats of 95% with walking and did not have any tachycardia.  CTA of the chest revealed moderate burden bilateral pulmonary emboli with bilateral posterior lower lobe infarcts with CT evidence of right heart strain (RV/LV ratio equals 1.12), this is consistent with submassive intermediate risk PE.  Code PE initiated by EDP staff and formal PCCM evaluation of CTA and chart pending.  Patient has been started empirically on IV heparin infusion with pharmacy managing.  ED Course:  Vital Signs: BP 109/73   Pulse 63   Temp 98.6 F (37 C) (Oral)   Resp (!) 24   Ht _0  (1.6 m)   Wt 58.1 kg (128 lb)   SpO2 100%   BMI  22.67 kg/m  CXR: 6/25: Negative CTA chest: As above Lab data: 140, potassium 4.0, chloride 105, glucose 101, BUN 13, creatinine 0.9, calcium 1.13, BNP normal, point-of-care troponin normal, 7800 with normal differential, hemoglobin 13.7, platelets 321,000, urine pregnancy negative; outpatient d-dimer 4.30, outpatient anemia panel iron 25 with % sat 6 and ferritin 185 Medications and treatments: Tessalon capsule 100 mg x 1, heparin bolus 3500 units followed by an infusion at 1000 units/h  Review of Systems:  In addition to the HPI above,  No Fever-chills, myalgias or other constitutional symptoms No Headache, changes with Vision or hearing, new weakness, tingling, numbness in any extremity, dizziness, dysarthria or word finding difficulty, gait disturbance or imbalance, tremors or seizure activity No problems swallowing food or Liquids, indigestion/reflux, choking or coughing while eating, abdominal pain with or after eating No Abdominal pain, N/V, melena,hematochezia, dark tarry stools, constipation No dysuria, malodorous urine, hematuria or flank pain No new skin rashes, lesions, masses or bruises, No new joint pains, aches, swelling or redness No recent unintentional weight gain or loss No polyuria, polydypsia or polyphagia   Past Medical History:  Diagnosis Date  . AMA (advanced maternal age) multigravida 43+   . Arrhythmia    Mobitz type 1  . Chicken pox   . Depression   . Depression with anxiety   . Frequent headaches   . Heart murmur    as a child  . Tachycardia    as a child  . Thyroid disease   . Urinary incontinence  Past Surgical History:  Procedure Laterality Date  . ABDOMINOPLASTY    . BREAST ENHANCEMENT SURGERY    . CESAREAN SECTION    . CESAREAN SECTION Bilateral 11/01/2016   Procedure: CESAREAN SECTION;  Surgeon: Olga Millers, MD;  Location: [place];  Service: Obstetrics;  Laterality: Bilateral;  . KIDNEY SURGERY     congenital abnormality   of ureters  . MANDIBLE SURGERY    . TONSILLECTOMY      Social History   Socioeconomic History  . Marital status: Married    Spouse name: Not on file  . Number of children: Not on file  . Years of education: Not on file  . Highest education level: Not on file  Occupational History  . Not on file  Social Needs  . Financial resource strain: Not on file  . Food insecurity:    Worry: Not on file    Inability: Not on file  . Transportation needs:    Medical: Not on file    Non-medical: Not on file  Tobacco Use  . Smoking status: Former Research scientist (life sciences)  . Smokeless tobacco: Never Used  Substance and Sexual Activity  . Alcohol use: Yes  . Drug use: No  . Sexual activity: Yes    Partners: Male    Birth control/protection: None  Lifestyle  . Physical activity:    Days per week: Not on file    Minutes per session: Not on file  . Stress: Not on file  Relationships  . Social connections:    Talks on phone: Not on file    Gets together: Not on file    Attends religious service: Not on file    Active member of club or organization: Not on file    Attends meetings of clubs or organizations: Not on file    Relationship status: Not on file  . Intimate partner violence:    Fear of current or ex partner: Not on file    Emotionally abused: Not on file    Physically abused: Not on file    Forced sexual activity: Not on file  Other Topics Concern  . Not on file  Social History Narrative   Married, college-educated.  Works in Press photographer.  4 children.   Social drinker.  Former smoker.   Exercises 3 times a week.   Smoke alarm in the home.  Wears her seatbelt.   Feels safe in her relationships.    Mobility: Independent, runs frequently Work history: Not obtained   Allergies  Allergen Reactions  . Sulfa Antibiotics Anaphylaxis and Hives  . Erythromycin Other (See Comments)    unknown As a child, unknown reaction  . Morphine Other (See Comments)    aggressiveness  . Morphine And Related  Itching and Other (See Comments)    hallucinations    Family History  Problem Relation Age of Onset  . Diabetes Mother   . Arthritis Mother   . Depression Mother   . Stroke Mother   . Miscarriages / [place] Mother   . Mental illness Mother   . Hyperlipidemia Father   . Hypertension Father   . Alcohol abuse Father   . Arthritis Father   . Depression Father   . Drug abuse Father   . Hearing loss Father   . Cancer Paternal Aunt   . Cancer Paternal Grandmother   . Ovarian cancer Paternal Grandmother   . Breast cancer Paternal Grandmother   . Mental illness Sister   . Asthma Brother   .  Depression Brother   . Asthma Son   . Depression Maternal Grandmother   . Diabetes Maternal Grandmother   . Heart disease Maternal Grandfather   . Kidney disease Maternal Grandfather   . Stroke Maternal Grandfather   . Alcohol abuse Paternal Grandfather   . Asthma Paternal Grandfather   . Depression Paternal Grandfather   . Drug abuse Paternal Grandfather   . Hearing loss Paternal Grandfather   . Heart disease Paternal Grandfather   . Hyperlipidemia Paternal Grandfather   . Mental illness Paternal Grandfather       Prior to Admission medications   Medication Sig Start Date End Date Taking? Authorizing Provider  cephALEXin (KEFLEX) 500 MG capsule Take 1 capsule (500 mg total) by mouth 3 (three) times daily. 10/16/17   Kuneff, Renee A, DO  norgestimate-ethinyl estradiol (SPRINTEC 28) 0.25-35 MG-MCG tablet norethindrone (contraceptive) 0.35 mg tablet    [provider]  Prenatal Vit-Fe Fumarate-FA (PRENATAL MULTIVITAMIN) TABS tablet Take 1 tablet by mouth daily at 12 noon.    [provider]    Physical Exam: Vitals:   10/17/17 1030 10/17/17 1100 10/17/17 1130 10/17/17 1200  BP: 111/72 104/69 114/76 109/73  Pulse: 77 65 72 63  Resp: 20 (!) 21 (!) 23 (!) 24  Temp:      TempSrc:      SpO2: 99% 100% 100% 100%  Weight:      Height:          Constitutional: NAD,  calm, uncomfortable 2/2 ongoing paroxysmal coughing elicited by inspiratory effort Eyes: PERRL, lids and conjunctivae normal ENMT: Mucous membranes are moist. Posterior pharynx clear of any exudate or lesions. Normal dentition.  Neck: normal, supple, no masses, no thyromegaly Respiratory: clear to auscultation bilaterally, no wheezing, no crackles diminished throughout. Normal respiratory effort. No accessory muscle use.  Paroxysmal coughing elicited with inspiratory effort.  Oxygen in place for comfort Cardiovascular: Regular rate and rhythm, no murmurs / rubs / gallops. No extremity edema. 2+ pedal pulses. No carotid bruits.  Abdomen: no tenderness, no masses palpated. No hepatosplenomegaly. Bowel sounds positive.  Musculoskeletal: no clubbing / cyanosis. No joint deformity upper and lower extremities. Good ROM, no contractures. Normal muscle tone.  Skin: no rashes, lesions, ulcers. No induration Neurologic: CN 2-12 grossly intact. Sensation intact, DTR normal. Strength 5/5 x all 4 extremities.  Psychiatric: Normal judgment and insight. Alert and oriented x 3. Normal mood.    Labs on Admission: I have personally reviewed following labs and imaging studies  CBC: Recent Labs  Lab 10/16/17 1454 10/17/17 0934 10/17/17 0945  WBC 8.8 7.8  --   NEUTROABS  --  5.2  --   HGB 14.3 13.7 13.6  HCT 41.5 41.6 40.0  MCV 95.0 98.3  --   PLT 347 321  --    Basic Metabolic Panel: Recent Labs  Lab 10/16/17 1454 10/17/17 0945  NA 140 140  K 4.9 4.0  CL 104 105  CO2 25  --   GLUCOSE 92 101*  BUN 11 13  CREATININE 0.86 0.90  CALCIUM 10.0  --    GFR: Estimated Creatinine Clearance: 68.7 mL/min (by C-G formula based on SCr of 0.9 mg/dL). Liver Function Tests: Recent Labs  Lab 10/16/17 1454  AST 14  ALT 12  BILITOT 0.4  PROT 8.1   No results for input(s): LIPASE, AMYLASE in the last 168 hours. No results for input(s): AMMONIA in the last 168 hours. Coagulation Profile: No results for  input(s): INR, PROTIME in  the last 168 hours. Cardiac Enzymes: No results for input(s): CKTOTAL, CKMB, CKMBINDEX, TROPONINI in the last 168 hours. BNP (last 3 results) No results for input(s): PROBNP in the last 8760 hours. HbA1C: No results for input(s): HGBA1C in the last 72 hours. CBG: No results for input(s): GLUCAP in the last 168 hours. Lipid Profile: No results for input(s): CHOL, HDL, LDLCALC, TRIG, CHOLHDL, LDLDIRECT in the last 72 hours. Thyroid Function Tests: Recent Labs    10/16/17 1454  TSH 1.29   Anemia Panel: Recent Labs    10/16/17 1454  FERRITIN 185*  TIBC 393  IRON 25*   Urine analysis:    Component Value Date/Time   COLORURINE YELLOW 11/10/2006 0014   APPEARANCEUR CLEAR 11/10/2006 0014   LABSPEC 1.018 11/10/2006 0014   PHURINE 6.0 11/10/2006 0014   GLUCOSEU NEGATIVE 11/10/2006 0014   HGBUR NEGATIVE 11/10/2006 0014   BILIRUBINUR Negative 10/16/2017 1501   KETONESUR NEGATIVE 11/10/2006 0014   PROTEINUR Negative 10/16/2017 1501   PROTEINUR NEGATIVE 11/10/2006 0014   UROBILINOGEN 0.2 10/16/2017 1501   UROBILINOGEN 0.2 11/10/2006 0014   NITRITE Negative 10/16/2017 1501   NITRITE NEGATIVE 11/10/2006 0014   LEUKOCYTESUR Large (3+) (A) 10/16/2017 1501   Sepsis Labs: _0 (procalcitonin:4,lacticidven:4) )No results found for this or any previous visit (from the past 240 hour(s)).   Radiological Exams on Admission: Dg Chest 2 View  Result Date: 10/17/2017 CLINICAL DATA:  Shortness of breath and dry cough for several weeks EXAM: CHEST - 2 VIEW COMPARISON:  10/05/2016 FINDINGS: The heart size and mediastinal contours are within normal limits. Both lungs are clear. The visualized skeletal structures are unremarkable. IMPRESSION: No active cardiopulmonary disease. Electronically Signed   By: Inez Catalina M.D.   On: 10/17/2017 07:50   Ct Angio Chest Pe W And/or Wo Contrast  Result Date: 10/17/2017 CLINICAL DATA:  Shortness of breath with persistent  symptoms 3 weeks. Elevated D-dimer. EXAM: CT ANGIOGRAPHY CHEST WITH CONTRAST TECHNIQUE: Multidetector CT imaging of the chest was performed using the standard protocol during bolus administration of intravenous contrast. Multiplanar CT image reconstructions and MIPs were obtained to evaluate the vascular anatomy. CONTRAST:  176m ISOVUE-370 IOPAMIDOL (ISOVUE-370) INJECTION 76% COMPARISON:  None. FINDINGS: Cardiovascular: Heart is normal size. Pulmonary arterial system is well opacified with moderate emboli over the distal main/proximal lower lobar and lingular pulmonary arteries. Also moderate emboli over the distal right main pulmonary artery extending into the proximal upper and lower lobar pulmonary arteries. RV/LV ratio is 50.8/45.2= 1.12 compatible with right heart strain. Mediastinum/Nodes: No evidence of mediastinal or hilar adenopathy. Remaining mediastinal structures are within normal. Lungs/Pleura: Pleural based focal opacification over the posterior lower lungs bilaterally with central lucency compatible with lung infarct. No effusion. Airways are normal. Upper Abdomen: No acute abnormality. Musculoskeletal: No chest wall abnormality. No acute or significant osseous findings. Review of the MIP images confirms the above findings. IMPRESSION: Moderate burden bilateral pulmonary emboli. Bilateral posterior lower lobe lung infarcts. Positive for acute PE with CT evidence of right heart strain (RV/LV Ratio = 1.12) consistent with at least submassive (intermediate risk) PE. The presence of right heart strain has been associated with an increased risk of morbidity and mortality. Please activate Code PE by paging [phone] Critical Value/emergent results were called by telephone at the time of interpretation on 10/17/2017 at 10:54 am to Dr. DPattricia Boss who verbally acknowledged these results. Electronically Signed   By: DMarin OlpM.D.   On: 10/17/2017 10:54    EKG: (Independently reviewed) sinus  rhythm with ventricular rate 71 bpm, QTC 468 ms, normal R wave progression although slightly delayed to V4, no acute ischemic changes no evidence of right heart strain on EKG  Assessment/Plan Principal Problem:   Bilateral pulmonary embolism  -Presents with 3 weeks of dry cough and dyspnea on exertion with elevated d-dimer from the outpatient setting and CTA of chest revealing bilateral PE was submassive criteria met and CT evidence of right heart strain -Suspect etiology secondary to reintroduction of estrogen-based birth control pills-I have discontinued for now -Patient did not have any hypoxemia with ambulation although did experience profound dyspnea and paroxysmal coughing, I suspect this is related to her under lying excellent physical conditioning noting she runs regularly at baseline -PCCM formal evaluation regarding code PE pending -Continue oxygen for comfort -Full dose anticoagulation -Bilateral lower extremity venous duplex -CM consulted to determine if patient can afford co-pay for NOACs; if cost prohibitive will need to begin on warfarin and will remain inpatient until INR therapeutic or can discharge with Lovenox bridge if not cost prohibitive -Anticipate minimum 6 months anticoagulation -Consider outpatient hematology evaluation-in the acute phase it is not appropriate to obtain coagulopathy panel/serologies-patient does not have significant history of coagulopathies although she does think maternal grandmother may have had DVT at some point -Patient also has bilateral posterior lower lobe infarct from PE-continue to follow  Active Problems:   Thyroid disease -Was not on medications prior to arrival    Heart murmur (childhood) -Echocardiogram June 2018: Normal LVEF, abnormal septal motion, no valvular abnormality     UTI -Patient was started on Keflex x3 days on 6/25-continue     Iron deficiency -Detected on outpatient labs -Reports history of heavy menses -Defer to PCP  decision regarding oral replacement follow-up     Depression and anxiety -Not on medications prior to admission    **Additional lab, imaging and/or diagnostic evaluation at discretion of supervising physician  DVT prophylaxis: IV heparin Code Status: Full Family Communication: Husband Disposition Plan: Home Consults called: None (PCCM Code PE eval pending)    Johaan Ryser L. ANP-BC [facility] Pager [phone]   If 7PM-7AM, please contact night-coverage [email or web address] Password [facility]  10/17/2017, 12:19 PM

## 2017-10-18 ENCOUNTER — Inpatient Hospital Stay (HOSPITAL_COMMUNITY): Payer: 59

## 2017-10-18 DIAGNOSIS — F418 Other specified anxiety disorders: Secondary | ICD-10-CM

## 2017-10-18 DIAGNOSIS — E079 Disorder of thyroid, unspecified: Secondary | ICD-10-CM

## 2017-10-18 DIAGNOSIS — I2699 Other pulmonary embolism without acute cor pulmonale: Secondary | ICD-10-CM

## 2017-10-18 LAB — URINE CULTURE
MICRO NUMBER: 90759144
SPECIMEN QUALITY:: ADEQUATE

## 2017-10-18 LAB — TROPONIN I

## 2017-10-18 LAB — BETA-2-GLYCOPROTEIN I ABS, IGG/M/A: BETA-2-GLYCOPROTEIN I IGA: 9 GPI IgA units (ref 0–25)

## 2017-10-18 LAB — CBC
HCT: 35.2 % — ABNORMAL LOW (ref 36.0–46.0)
HEMATOCRIT: 37.6 % (ref 36.0–46.0)
HEMOGLOBIN: 12.2 g/dL (ref 12.0–15.0)
Hemoglobin: 11.7 g/dL — ABNORMAL LOW (ref 12.0–15.0)
MCH: 31.9 pg (ref 26.0–34.0)
MCH: 32.4 pg (ref 26.0–34.0)
MCHC: 32.4 g/dL (ref 30.0–36.0)
MCHC: 33.2 g/dL (ref 30.0–36.0)
MCV: 97.5 fL (ref 78.0–100.0)
MCV: 98.4 fL (ref 78.0–100.0)
PLATELETS: 285 10*3/uL (ref 150–400)
Platelets: 295 10*3/uL (ref 150–400)
RBC: 3.61 MIL/uL — ABNORMAL LOW (ref 3.87–5.11)
RBC: 3.82 MIL/uL — AB (ref 3.87–5.11)
RDW: 11.7 % (ref 11.5–15.5)
RDW: 11.6 % (ref 11.5–15.5)
WBC: 6.3 10*3/uL (ref 4.0–10.5)
WBC: 7.5 10*3/uL (ref 4.0–10.5)

## 2017-10-18 LAB — BASIC METABOLIC PANEL
ANION GAP: 8 (ref 5–15)
BUN: 12 mg/dL (ref 6–20)
CALCIUM: 8.6 mg/dL — AB (ref 8.9–10.3)
CO2: 25 mmol/L (ref 22–32)
Chloride: 108 mmol/L (ref 98–111)
Creatinine, Ser: 0.89 mg/dL (ref 0.44–1.00)
GFR calc non Af Amer: >60 mL/min (ref >60)
Glucose, Bld: 116 mg/dL — ABNORMAL HIGH (ref 70–99)
Potassium: 4.2 mmol/L (ref 3.5–5.1)
Sodium: 141 mmol/L (ref 135–145)

## 2017-10-18 LAB — HOMOCYSTEINE: HOMOCYSTEINE-NORM: 12.8 umol/L (ref 0.0–15.0)

## 2017-10-18 LAB — HIV ANTIBODY (ROUTINE TESTING W REFLEX): HIV Screen 4th Generation wRfx: NONREACTIVE

## 2017-10-18 LAB — HEPARIN LEVEL (UNFRACTIONATED)
Heparin Unfractionated: 0.32 IU/mL (ref 0.30–0.70)
Heparin Unfractionated: 0.47 IU/mL (ref 0.30–0.70)

## 2017-10-18 LAB — PROTEIN C, TOTAL: Protein C, Total: 102 % (ref 60–150)

## 2017-10-18 MED ORDER — CEPHALEXIN 500 MG PO CAPS
500.0000 mg | ORAL_CAPSULE | Freq: Three times a day (TID) | ORAL | Status: DC
  Administered 2017-10-18 – 2017-10-19 (×4): 500 mg via ORAL
  Filled 2017-10-18 (×3): qty 1

## 2017-10-18 NOTE — Progress Notes (Signed)
#   2.  S/W [facility] @ [facility] RX # [phone]   1. ELIQUIS 2.5 MG BID  COVER- YES  CO-PAY- $ 50.00  TIER- NO  PRIOR APPROVAL- NO   2. ELIQUIS 5 MG BID  COVER- YES  CO-PAY- $ 50.00  TIER- NO  PRIOR APPROVAL- NO    3. XARLETO 15 MG BID  COVER- YES  CO-PAY- $ 25.00  TIER- NO  PRIOR APPROVAL- NO   4. XARELTO 20 MG DAILY  COVER- YES  CO-PAY- $ 25.00  TIER- NO  PRIOR APPROVAL- NO   NO DEDUCTIBLE   PREFERRED PHARMACY : YES [facility] AND [facility]

## 2017-10-18 NOTE — Care Management Note (Addendum)
Case Management Note  Patient Details  Name: [name] [name] MRN: [id number] Date of Birth: 11/04/1975  Subjective/Objective:   From home, presents with PE on heparin drip, will be on eliquis, awaiting benefit check.  NCM gave patient the 30 day savings coupon and the 10.00 co pay coupon.  Plan for dc tomorrow per MD.                Action/Plan: DC home when ready.   Expected Discharge Date:  10/20/17               Expected Discharge Plan:  Home/Self Care  In-House Referral:     Discharge planning Services  CM Consult, Medication Assistance  Post Acute Care Choice:    Choice offered to:     DME Arranged:    DME Agency:     HH Arranged:    HH Agency:     Status of Service:  Completed, signed off  If discussed at [employer] of Stay Meetings, dates discussed:    Additional Comments:  Leone Haven, RN 10/18/2017, 12:38 PM

## 2017-10-18 NOTE — Progress Notes (Signed)
PROGRESS NOTE    BRYNLEI KLAUSNER  ZOX:[id number] DOB: 10/23/1975 DOA: 10/17/2017 PCP: Natalia Leatherwood, DO   Brief Narrative: [name] [name] is a 42 y.o. female with past medical history of depression with anxiety presented to the hospital with persistent but intermittent/episodic SOB and cough.  She also had significant fatigue.  Her exam was unremarkable.  Negative lower extremity tenderness or swelling.  She was noted to have positive d-dimer in the ED so a CT angiogram of the chest was performed which showed a moderate burden of bilateral pulmonary embolism at least submassive.  Patient was then started on heparin drip, pulmonary was consulted and was admitted to the hospital for further observation.  Assessment & Plan:   Principal Problem:   Bilateral pulmonary embolism (HCC) Active Problems:   Thyroid disease   Depression with anxiety   Heart murmur  Bilateral pulmonary embolism submassive.  Patient has been hemodynamically stable.  She is not requiring supplemental oxygen.  Patient has been seen by pulmonary and recommend anticoagulation.  2D echocardiogram showed LV ejection fraction of 55%.  No RV strain was reported.  Will get duplex ultrasound as well.  She does have a history of taking oral contraceptive pills as outpatient as only identified risk factor so far.  I had a prolonged discussion with the patient regarding warfarin versus NOACs and she wishes to know the cost behind both of them.  I spoke with care manager about it.  Hypercoagulable panel has been ordered by pulmonary.  And will need anticoagulation for 3 to 6 months.  Discontinue oral contraceptives.  Pulmonary infarct.  We will closely monitor.  See comments of cough with some productive sputum and feels that she might have a spit some blood.  Closely monitor for hemoptysis  History of anxiety and depression.  DVT prophylaxis: Heparin drip  Code Status: Full code  Family Communication: Spoke with the patient and the  patient's husband at bedside  Disposition Plan: Home tomorrow if remains hemodynamically stable  Consultants: Pulmonary  Procedures: None  Antimicrobials: Keflex   Subjective:  Patient complains of cough with productive sputum and concern for hemoptysis.  Denies increasing dyspnea or chest pain.  Objective: Vitals:   10/18/17 0300 10/18/17 0540 10/18/17 0751 10/18/17 1204  BP: 103/63 95/62 111/62 107/81  Pulse: 64  71 71  Resp: (!) 25 20 20 19   Temp: 98.4 F (36.9 C)  (!) 97.5 F (36.4 C) 98.7 F (37.1 C)  TempSrc: Oral  Axillary Oral  SpO2: 97% 99% 98% 98%  Weight:      Height:        Intake/Output Summary (Last 24 hours) at 10/18/2017 1535 Last data filed at 10/18/2017 1535 Gross per 24 hour  Intake 1146.33 ml  Output -  Net 1146.33 ml   Filed Weights   10/17/17 0927  Weight: 58.1 kg (128 lb)    Examination: General exam: Appears calm and comfortable ,Not in obvious distress,average built. HEENT:PERRL,Oral mucosa moist, Ear/Nose normal on gross exam Respiratory system: Bilateral equal air entry, normal vesicular breath sounds, no wheezes or crackles  Cardiovascular system: S1 & S2 heard, RRR. No JVD. murmur noted, no rubs, gallops or clicks. No pedal edema. Gastrointestinal system: Abdomen is nondistended, soft and nontender. No organomegaly or masses felt. Normal bowel sounds heard. MSK: Normal muscle bulk,tone ,power Extremities: No edema, no clubbing ,no cyanosis, distal peripheral pulses palpable.  No leg tenderness or Homans sign. Skin: No rashes, lesions or ulcers,no icterus ,no pallor Psychiatry:  Judgement and insight appear normal. Mood & affect appropriate.  Central nervous system: Alert and oriented. No focal neurological deficits.  Data Reviewed: I have personally reviewed following labs and imaging studies  CBC: Recent Labs  Lab 10/16/17 1454 10/17/17 0934 10/17/17 0945 10/18/17 0012 10/18/17 0955  WBC 8.8 7.8  --  7.5 6.3  NEUTROABS  --   5.2  --   --   --   HGB 14.3 13.7 13.6 11.7* 12.2  HCT 41.5 41.6 40.0 35.2* 37.6  MCV 95.0 98.3  --  97.5 98.4  PLT 347 321  --  285 295   Basic Metabolic Panel: Recent Labs  Lab 10/16/17 1454 10/17/17 0945 10/18/17 0012  NA 140 140 141  K 4.9 4.0 4.2  CL 104 105 108  CO2 25  --  25  GLUCOSE 92 101* 116*  BUN 11 13 12   CREATININE 0.86 0.90 0.89  CALCIUM 10.0  --  8.6*   GFR: Estimated Creatinine Clearance: 69.5 mL/min (by C-G formula based on SCr of 0.89 mg/dL). Liver Function Tests: Recent Labs  Lab 10/16/17 1454  AST 14  ALT 12  BILITOT 0.4  PROT 8.1   No results for input(s): LIPASE, AMYLASE in the last 168 hours. No results for input(s): AMMONIA in the last 168 hours. Coagulation Profile: No results for input(s): INR, PROTIME in the last 168 hours. Cardiac Enzymes: Recent Labs  Lab 10/17/17 1221 10/17/17 1739 10/17/17 2343  TROPONINI <0.03 <0.03 <0.03   BNP (last 3 results) No results for input(s): PROBNP in the last 8760 hours. HbA1C: No results for input(s): HGBA1C in the last 72 hours. CBG: No results for input(s): GLUCAP in the last 168 hours. Lipid Profile: No results for input(s): CHOL, HDL, LDLCALC, TRIG, CHOLHDL, LDLDIRECT in the last 72 hours. Thyroid Function Tests: Recent Labs    10/16/17 1454  TSH 1.29   Anemia Panel: Recent Labs    10/16/17 1454  FERRITIN 185*  TIBC 393  IRON 25*   Sepsis Labs: No results for input(s): PROCALCITON, LATICACIDVEN in the last 168 hours.  Recent Results (from the past 240 hour(s))  Urine Culture     Status: Abnormal   Collection Time: 10/16/17  3:48 PM  Result Value Ref Range Status   MICRO NUMBER: [id number]  Final   SPECIMEN QUALITY: ADEQUATE  Final   Sample Source URINE  Final   STATUS: FINAL  Final   ISOLATE 1: Escherichia coli (A)  Final    Comment: 10,000-50,000 CFU/mL of Escherichia coli      Susceptibility   Escherichia coli - URINE CULTURE, REFLEX    AMOX/CLAVULANIC 8 Sensitive      AMPICILLIN >=32 Resistant     AMPICILLIN/SULBACTAM 4 Sensitive     CEFAZOLIN* <=4 Not Reportable      * For infections other than uncomplicated UTIcaused by E. coli, K. pneumoniae or P. mirabilis:Cefazolin is resistant if MIC > or = 8 mcg/mL.(Distinguishing susceptible versus intermediatefor isolates with MIC < or = 4 mcg/mL requiresadditional testing.)For uncomplicated UTI caused by E. coli,K. pneumoniae or P. mirabilis: Cefazolin issusceptible if MIC <32 mcg/mL and predictssusceptible to the oral agents cefaclor, cefdinir,cefpodoxime, cefprozil, cefuroxime, cephalexinand loracarbef.    CEFEPIME <=1 Sensitive     CEFTRIAXONE <=1 Sensitive     CIPROFLOXACIN >=4 Resistant     LEVOFLOXACIN 4 Intermediate     ERTAPENEM <=0.5 Sensitive     GENTAMICIN >=16 Resistant     IMIPENEM <=0.25 Sensitive     NITROFURANTOIN <=  16 Sensitive     PIP/TAZO <=4 Sensitive     TOBRAMYCIN 8 Intermediate     TRIMETH/SULFA* <=20 Sensitive      * For infections other than uncomplicated UTIcaused by E. coli, K. pneumoniae or P. mirabilis:Cefazolin is resistant if MIC > or = 8 mcg/mL.(Distinguishing susceptible versus intermediatefor isolates with MIC < or = 4 mcg/mL requiresadditional testing.)For uncomplicated UTI caused by E. coli,K. pneumoniae or P. mirabilis: Cefazolin issusceptible if MIC <32 mcg/mL and predictssusceptible to the oral agents cefaclor, cefdinir,cefpodoxime, cefprozil, cefuroxime, cephalexinand loracarbef.Legend:S = Susceptible  I = IntermediateR = Resistant  NS = Not susceptible* = Not tested  NR = Not reported**NN = See antimicrobic comments  MRSA PCR Screening     Status: Abnormal   Collection Time: 10/17/17  5:04 PM  Result Value Ref Range Status   MRSA by PCR POSITIVE (A) NEGATIVE Final    Comment:        The GeneXpert MRSA Assay (FDA approved for NASAL specimens only), is one component of a comprehensive MRSA colonization surveillance program. It is not intended to diagnose MRSA infection  nor to guide or monitor treatment for MRSA infections. RESULT CALLED TO, READ BACK BY AND VERIFIED WITH: Randa Ngo RN 1610 10/17/17 A BROWNING Performed at [facility], [address] [place], [place] [address]      Radiology Studies: Dg Chest 2 View  Result Date: 10/17/2017 CLINICAL DATA:  Shortness of breath and dry cough for several weeks EXAM: CHEST - 2 VIEW COMPARISON:  10/05/2016 FINDINGS: The heart size and mediastinal contours are within normal limits. Both lungs are clear. The visualized skeletal structures are unremarkable. IMPRESSION: No active cardiopulmonary disease. Electronically Signed   By: Alcide Clever M.D.   On: 10/17/2017 07:50   Ct Angio Chest Pe W And/or Wo Contrast  Result Date: 10/17/2017 CLINICAL DATA:  Shortness of breath with persistent symptoms 3 weeks. Elevated D-dimer. EXAM: CT ANGIOGRAPHY CHEST WITH CONTRAST TECHNIQUE: Multidetector CT imaging of the chest was performed using the standard protocol during bolus administration of intravenous contrast. Multiplanar CT image reconstructions and MIPs were obtained to evaluate the vascular anatomy. CONTRAST:  ISOVUE-370 IOPAMIDOL (ISOVUE-370) INJECTION 76% COMPARISON:  None. FINDINGS: Cardiovascular: Heart is normal size. Pulmonary arterial system is well opacified with moderate emboli over the distal main/proximal lower lobar and lingular pulmonary arteries. Also moderate emboli over the distal right main pulmonary artery extending into the proximal upper and lower lobar pulmonary arteries. RV/LV ratio is 50.8/45.2= 1.12 compatible with right heart strain. Mediastinum/Nodes: No evidence of mediastinal or hilar adenopathy. Remaining mediastinal structures are within normal. Lungs/Pleura: Pleural based focal opacification over the posterior lower lungs bilaterally with central lucency compatible with lung infarct. No effusion. Airways are normal. Upper Abdomen: No acute abnormality. Musculoskeletal: No chest wall  abnormality. No acute or significant osseous findings. Review of the MIP images confirms the above findings. IMPRESSION: Moderate burden bilateral pulmonary emboli. Bilateral posterior lower lobe lung infarcts. Positive for acute PE with CT evidence of right heart strain (RV/LV Ratio = 1.12) consistent with at least submassive (intermediate risk) PE. The presence of right heart strain has been associated with an increased risk of morbidity and mortality. Please activate Code PE by paging [phone]. Critical Value/emergent results were called by telephone at the time of interpretation on 10/17/2017 at 10:54 am to Dr. Margarita Grizzle, who verbally acknowledged these results. Electronically Signed   By: Elberta Fortis M.D.   On: 10/17/2017 10:54  Scheduled Meds: . cephALEXin  500 mg Oral Q8H  . Chlorhexidine Gluconate Cloth  6 each Topical Q0600  . mupirocin ointment  1 application Nasal BID  . sodium chloride flush  3 mL Intravenous Q12H   Continuous Infusions: . heparin 1,300 Units/hr (10/18/17 1535)     LOS: 1 day   Time spent: More than 50% of that time was spent in counseling and/or coordination of care.  Joycelyn Das, MD [facility] Pager [phone]  If 7PM-7AM, please contact night-coverage [email or web address] Password [facility] 10/18/2017, 3:35 PM

## 2017-10-18 NOTE — Progress Notes (Signed)
ANTICOAGULATION CONSULT NOTE - Follow Up Consult  Pharmacy Consult for heparin Indication: pulmonary embolus  Labs: Recent Labs    10/16/17 1454 10/17/17 0934 10/17/17 0945 10/17/17 1221 10/17/17 1739 10/17/17 2343 10/18/17 0012 10/18/17 0220  HGB 14.3 13.7 13.6  --   --   --  11.7*  --   HCT 41.5 41.6 40.0  --   --   --  35.2*  --   PLT 347 321  --   --   --   --  285  --   HEPARINUNFRC  --   --   --   --  <0.10*  --   --  0.32  CREATININE 0.86  --  0.90  --   --   --  0.89  --   TROPONINI  --   --   --  <0.03 <0.03 <0.03  --   --     Assessment: 42yo female therapeutic on heparin after rate change though at low end of goal and would prefer higher w/ PE w/ RHS; of note Hgb has been trending down (14.3>13.7>11.7) but RN notes no overt signs of bleeding.  Goal of Therapy:  Heparin level 0.3-0.7 units/ml   Plan:  Will increase heparin gtt by 1-2 units/kg/hr to 1300 units/hr and check level in 6 hours; will also recheck CBC.    Vernard Gambles, PharmD, BCPS  10/18/2017,3:42 AM

## 2017-10-18 NOTE — Progress Notes (Signed)
ANTICOAGULATION CONSULT NOTE - Follow Up Consult  Pharmacy Consult for heparin Indication: pulmonary embolus  Labs: Recent Labs    10/16/17 1454 10/17/17 0934 10/17/17 0945 10/17/17 1221 10/17/17 1739 10/17/17 2343 10/18/17 0012 10/18/17 0220 10/18/17 0955  HGB 14.3 13.7 13.6  --   --   --  11.7*  --  12.2  HCT 41.5 41.6 40.0  --   --   --  35.2*  --  37.6  PLT 347 321  --   --   --   --  285  --  295  HEPARINUNFRC  --   --   --   --  <0.10*  --   --  0.32 0.47  CREATININE 0.86  --  0.90  --   --   --  0.89  --   --   TROPONINI  --   --   --  <0.03 <0.03 <0.03  --   --   --     Assessment: 42yo female therapeutic on heparin  X 2 for PE No bleeding noted  Goal of Therapy:  Heparin level 0.3-0.7 units/ml   Plan:  Continue heparin at 1300 units / hr Daily heparin level, CBC  Thank you Okey Regal, PharmD [phone] 10/18/2017,1:39 PM

## 2017-10-18 NOTE — Progress Notes (Signed)
LE venous duplex prelim: negative for DVT. [place], RDMS, RVT  

## 2017-10-19 DIAGNOSIS — R011 Cardiac murmur, unspecified: Secondary | ICD-10-CM

## 2017-10-19 LAB — HEPARIN LEVEL (UNFRACTIONATED): HEPARIN UNFRACTIONATED: 0.39 [IU]/mL (ref 0.30–0.70)

## 2017-10-19 LAB — CBC
HCT: 36.6 % (ref 36.0–46.0)
Hemoglobin: 12 g/dL (ref 12.0–15.0)
MCH: 32.3 pg (ref 26.0–34.0)
MCHC: 32.8 g/dL (ref 30.0–36.0)
MCV: 98.7 fL (ref 78.0–100.0)
PLATELETS: 263 10*3/uL (ref 150–400)
RBC: 3.71 MIL/uL — ABNORMAL LOW (ref 3.87–5.11)
RDW: 11.6 % (ref 11.5–15.5)
WBC: 5.6 10*3/uL (ref 4.0–10.5)

## 2017-10-19 LAB — BASIC METABOLIC PANEL
Anion gap: 7 (ref 5–15)
BUN: 12 mg/dL (ref 6–20)
CO2: 24 mmol/L (ref 22–32)
Calcium: 8.7 mg/dL — ABNORMAL LOW (ref 8.9–10.3)
Chloride: 110 mmol/L (ref 98–111)
Creatinine, Ser: 1.01 mg/dL — ABNORMAL HIGH (ref 0.44–1.00)
Glucose, Bld: 126 mg/dL — ABNORMAL HIGH (ref 70–99)
Potassium: 4.1 mmol/L (ref 3.5–5.1)
SODIUM: 141 mmol/L (ref 135–145)

## 2017-10-19 LAB — CARDIOLIPIN ANTIBODIES, IGG, IGM, IGA
Anticardiolipin IgA: <9 APL U/mL (ref 0–11)
Anticardiolipin IgG: <9 GPL U/mL (ref 0–14)
Anticardiolipin IgM: <9 MPL U/mL (ref 0–12)

## 2017-10-19 LAB — PROTEIN S ACTIVITY: PROTEIN S ACTIVITY: 77 % (ref 63–140)

## 2017-10-19 LAB — PROTEIN C ACTIVITY: Protein C Activity: 135 % (ref 73–180)

## 2017-10-19 LAB — LUPUS ANTICOAGULANT PANEL
DRVVT: 40.1 s (ref 0.0–47.0)
PTT Lupus Anticoagulant: 45 s (ref 0.0–51.9)

## 2017-10-19 LAB — PROTEIN S, TOTAL: Protein S Ag, Total: 85 % (ref 60–150)

## 2017-10-19 MED ORDER — APIXABAN 5 MG PO TABS: 10.0000 mg | ORAL_TABLET | Freq: Two times a day (BID) | ORAL | Status: DC

## 2017-10-19 MED ORDER — ELIQUIS 5 MG VTE STARTER PACK: ORAL_TABLET | ORAL | 0 refills | Status: DC

## 2017-10-19 MED ORDER — APIXABAN 5 MG PO TABS
10.0000 mg | ORAL_TABLET | Freq: Once | ORAL | Status: AC
  Administered 2017-10-19: 10 mg via ORAL
  Filled 2017-10-19: qty 2

## 2017-10-19 NOTE — Progress Notes (Signed)
Patient DC to home with husband, VSS, discharge instructions reviewed, teachback displayed. Patient has all belongings.

## 2017-10-19 NOTE — Discharge Summary (Signed)
Physician Discharge Summary  [name] [name] ZOX:[id number] DOB: [birth year] DOA: 10/17/2017  PCP: Natalia Leatherwood, DO  Admit date: 10/17/2017   Discharge date: 10/19/2017  Admitted From: Home  Disposition:  Home  Discharge Condition:Stable  CODE STATUS:FULL  Diet recommendation: Regular  Brief/Interim Summary:  [name] H Donlonis a 42 y.o.femalewith past medical history of depression with anxiety presented to the hospital with persistent but intermittent/episodic SOB and cough. She also had significant fatigue. Her physical exam was unremarkable.  Negative lower extremity tenderness or swelling.  She was noted to have positive d-dimer in the ED so a CT angiogram of the chest was performed which showed a moderate burden of bilateral pulmonary embolism, at least submassive.  Patient was then started on heparin drip, pulmonary was consulted and was admitted to the hospital for further observation.    Patient remained hemodynamically stable.  She did not have any further chest pain, hypoxia or hemoptysis.  Anticoagulation counseling was done to the patient and plan is Eliquis on discharge.  Risk versus benefits of anticoagulation has been thoroughly explained to the patient.  Patient will need to complete at least 62-month course of anticoagulation on discharge.  She has been advised to not to take contraceptive pills.  Patient also had a duplex ultrasound of the lower extremities which did not show any evidence of DVT.  She will have a follow-up with her primary care physician within a week to get refills for next 2 months.  She will be given a Eliquis starter pack on discharge.  I have also spoken with the patient's husband at bedside yesterday regarding the plan for anticoagulation.   Discharge Diagnoses:  Principal Problem:   Bilateral pulmonary embolism (HCC) Active Problems:   Thyroid disease   Depression with anxiety   Heart murmur  Discharge Instructions  Discharge Instructions     Diet general   Complete by:  As directed    Discharge instructions   Complete by:  As directed    Follow up with your primary care provider in one week. Discuss about blood clots and blood work done in the hospital.  Continue blood thinners as prescribed.  Please get refills for the blood thinner for the next 2 months from your family doctor. If you experience any bleeding please seek medical attention.   Increase activity slowly   Complete by:  As directed      Allergies as of 10/19/2017      Reactions   Sulfa Antibiotics Anaphylaxis, Hives   Erythromycin Other (See Comments)   unknown As a child, unknown reaction   Morphine Other (See Comments)   aggressiveness   Morphine And Related Itching, Other (See Comments)   hallucinations      Medication List    STOP taking these medications   SPRINTEC 28 0.25-35 MG-MCG tablet Generic drug:  norgestimate-ethinyl estradiol     TAKE these medications   cephALEXin 500 MG capsule Commonly known as:  KEFLEX Take 1 capsule (500 mg total) by mouth 3 (three) times daily.   ELIQUIS STARTER PACK 5 MG Tabs Take as directed on package: start with two-5mg  tablets twice daily for 7 days. On day 8, switch to one-5mg  tablet twice daily.   prenatal multivitamin Tabs tablet Take 1 tablet by mouth daily at 12 noon.      Follow-up Information    Kuneff, Renee A, DO. Schedule an appointment as soon as possible for a visit in 1 week(s).   Specialty:  Family Medicine Contact  information: [address] [place] [address] [phone]          Allergies  Allergen Reactions  . Sulfa Antibiotics Anaphylaxis and Hives  . Erythromycin Other (See Comments)    unknown As a child, unknown reaction  . Morphine Other (See Comments)    aggressiveness  . Morphine And Related Itching and Other (See Comments)    hallucinations    Consultations: Pulmonary   Procedures/Studies: Dg Chest 2 View  Result Date: 10/17/2017 CLINICAL DATA:   Shortness of breath and dry cough for several weeks EXAM: CHEST - 2 VIEW COMPARISON:  10/05/2016 FINDINGS: The heart size and mediastinal contours are within normal limits. Both lungs are clear. The visualized skeletal structures are unremarkable. IMPRESSION: No active cardiopulmonary disease. Electronically Signed   By: Alcide Clever M.D.   On: 10/17/2017 07:50   Ct Angio Chest Pe W And/or Wo Contrast  Result Date: 10/17/2017 CLINICAL DATA:  Shortness of breath with persistent symptoms 3 weeks. Elevated D-dimer. EXAM: CT ANGIOGRAPHY CHEST WITH CONTRAST TECHNIQUE: Multidetector CT imaging of the chest was performed using the standard protocol during bolus administration of intravenous contrast. Multiplanar CT image reconstructions and MIPs were obtained to evaluate the vascular anatomy. CONTRAST:  ISOVUE-370 IOPAMIDOL (ISOVUE-370) INJECTION 76% COMPARISON:  None. FINDINGS: Cardiovascular: Heart is normal size. Pulmonary arterial system is well opacified with moderate emboli over the distal main/proximal lower lobar and lingular pulmonary arteries. Also moderate emboli over the distal right main pulmonary artery extending into the proximal upper and lower lobar pulmonary arteries. RV/LV ratio is 50.8/45.2= 1.12 compatible with right heart strain. Mediastinum/Nodes: No evidence of mediastinal or hilar adenopathy. Remaining mediastinal structures are within normal. Lungs/Pleura: Pleural based focal opacification over the posterior lower lungs bilaterally with central lucency compatible with lung infarct. No effusion. Airways are normal. Upper Abdomen: No acute abnormality. Musculoskeletal: No chest wall abnormality. No acute or significant osseous findings. Review of the MIP images confirms the above findings. IMPRESSION: Moderate burden bilateral pulmonary emboli. Bilateral posterior lower lobe lung infarcts. Positive for acute PE with CT evidence of right heart strain (RV/LV Ratio = 1.12) consistent with at  least submassive (intermediate risk) PE. The presence of right heart strain has been associated with an increased risk of morbidity and mortality. Please activate Code PE by paging [phone]. Critical Value/emergent results were called by telephone at the time of interpretation on 10/17/2017 at 10:54 am to Dr. Margarita Grizzle, who verbally acknowledged these results. Electronically Signed   By: Elberta Fortis M.D.   On: 10/17/2017 10:54     Subjective: Denies any dizziness lightheadedness chest pain palpitations shortness of breath  Discharge Exam: Vitals:   10/19/17 0039 10/19/17 0749  BP: 98/63 101/83  Pulse: 66 72  Resp:  19  Temp: 98.7 F (37.1 C) 98.7 F (37.1 C)  SpO2: 97% 98%   Vitals:   10/18/17 1204 10/18/17 1708 10/19/17 0039 10/19/17 0749  BP: 107/81 102/67 98/63 101/83  Pulse: 71 61 66 72  Resp: 19 16  19   Temp: 98.7 F (37.1 C) 98.9 F (37.2 C) 98.7 F (37.1 C) 98.7 F (37.1 C)  TempSrc: Oral Oral Oral Oral  SpO2: 98% 97% 97% 98%  Weight:      Height:       General: Pt is alert, awake, not in acute distress Cardiovascular: RRR, S1/S2 +, no rubs, no gallops Respiratory: CTA bilaterally, no wheezing, no rhonchi Abdominal: Soft, NT, ND, bowel sounds + Extremities: no edema, no cyanosis  CNS nonfocal   The results of significant diagnostics from this hospitalization (including imaging, microbiology, ancillary and laboratory) are listed below for reference.     Microbiology: Recent Results (from the past 240 hour(s))  Urine Culture     Status: Abnormal   Collection Time: 10/16/17  3:48 PM  Result Value Ref Range Status   MICRO NUMBER: [id number]  Final   SPECIMEN QUALITY: ADEQUATE  Final   Sample Source URINE  Final   STATUS: FINAL  Final   ISOLATE 1: Escherichia coli (A)  Final    Comment: 10,000-50,000 CFU/mL of Escherichia coli      Susceptibility   Escherichia coli - URINE CULTURE, REFLEX    AMOX/CLAVULANIC 8 Sensitive     AMPICILLIN >=32 Resistant      AMPICILLIN/SULBACTAM 4 Sensitive     CEFAZOLIN* <=4 Not Reportable      * For infections other than uncomplicated UTIcaused by E. coli, K. pneumoniae or P. mirabilis:Cefazolin is resistant if MIC > or = 8 mcg/mL.(Distinguishing susceptible versus intermediatefor isolates with MIC < or = 4 mcg/mL requiresadditional testing.)For uncomplicated UTI caused by E. coli,K. pneumoniae or P. mirabilis: Cefazolin issusceptible if MIC <32 mcg/mL and predictssusceptible to the oral agents cefaclor, cefdinir,cefpodoxime, cefprozil, cefuroxime, cephalexinand loracarbef.    CEFEPIME <=1 Sensitive     CEFTRIAXONE <=1 Sensitive     CIPROFLOXACIN >=4 Resistant     LEVOFLOXACIN 4 Intermediate     ERTAPENEM <=0.5 Sensitive     GENTAMICIN >=16 Resistant     IMIPENEM <=0.25 Sensitive     NITROFURANTOIN <=16 Sensitive     PIP/TAZO <=4 Sensitive     TOBRAMYCIN 8 Intermediate     TRIMETH/SULFA* <=20 Sensitive      * For infections other than uncomplicated UTIcaused by E. coli, K. pneumoniae or P. mirabilis:Cefazolin is resistant if MIC > or = 8 mcg/mL.(Distinguishing susceptible versus intermediatefor isolates with MIC < or = 4 mcg/mL requiresadditional testing.)For uncomplicated UTI caused by E. coli,K. pneumoniae or P. mirabilis: Cefazolin issusceptible if MIC <32 mcg/mL and predictssusceptible to the oral agents cefaclor, cefdinir,cefpodoxime, cefprozil, cefuroxime, cephalexinand loracarbef.Legend:S = Susceptible  I = IntermediateR = Resistant  NS = Not susceptible* = Not tested  NR = Not reported**NN = See antimicrobic comments  MRSA PCR Screening     Status: Abnormal   Collection Time: 10/17/17  5:04 PM  Result Value Ref Range Status   MRSA by PCR POSITIVE (A) NEGATIVE Final    Comment:        The GeneXpert MRSA Assay (FDA approved for NASAL specimens only), is one component of a comprehensive MRSA colonization surveillance program. It is not intended to diagnose MRSA infection nor to guide or monitor  treatment for MRSA infections. RESULT CALLED TO, READ BACK BY AND VERIFIED WITH: Randa Ngo RN [id number] 10/17/17 A BROWNING Performed at [facility], [address] [place], [place] [address]      Labs: BNP (last 3 results) Recent Labs    10/17/17 0934  BNP 33.4   Basic Metabolic Panel: Recent Labs  Lab 10/16/17 1454 10/17/17 0945 10/18/17 0012 10/19/17 0330  NA 140 140 141 141  K 4.9 4.0 4.2 4.1  CL 104 105 108 110  CO2 25  --  25 24  GLUCOSE 92 101* 116* 126*  BUN 11 13 12 12   CREATININE 0.86 0.90 0.89 1.01*  CALCIUM 10.0  --  8.6* 8.7*   Liver Function Tests: Recent Labs  Lab 10/16/17 1454  AST  14  ALT 12  BILITOT 0.4  PROT 8.1   No results for input(s): LIPASE, AMYLASE in the last 168 hours. No results for input(s): AMMONIA in the last 168 hours. CBC: Recent Labs  Lab 10/16/17 1454 10/17/17 0934 10/17/17 0945 10/18/17 0012 10/18/17 0955 10/19/17 0330  WBC 8.8 7.8  --  7.5 6.3 5.6  NEUTROABS  --  5.2  --   --   --   --   HGB 14.3 13.7 13.6 11.7* 12.2 12.0  HCT 41.5 41.6 40.0 35.2* 37.6 36.6  MCV 95.0 98.3  --  97.5 98.4 98.7  PLT 347 321  --  285 295 263   Cardiac Enzymes: Recent Labs  Lab 10/17/17 1221 10/17/17 1739 10/17/17 2343  TROPONINI <0.03 <0.03 <0.03   BNP: Invalid input(s): POCBNP CBG: No results for input(s): GLUCAP in the last 168 hours. D-Dimer Recent Labs    10/16/17 1454  DDIMER 4.30*   Hgb A1c No results for input(s): HGBA1C in the last 72 hours. Lipid Profile No results for input(s): CHOL, HDL, LDLCALC, TRIG, CHOLHDL, LDLDIRECT in the last 72 hours. Thyroid function studies Recent Labs    10/16/17 1454  TSH 1.29   Anemia work up Recent Labs    10/16/17 1454  FERRITIN 185*  TIBC 393  IRON 25*   Urinalysis    Component Value Date/Time   COLORURINE YELLOW 11/10/2006 0014   APPEARANCEUR CLEAR 11/10/2006 0014   LABSPEC 1.018 11/10/2006 0014   PHURINE 6.0 11/10/2006 0014   GLUCOSEU NEGATIVE 11/10/2006  0014   HGBUR NEGATIVE 11/10/2006 0014   BILIRUBINUR Negative 10/16/2017 1501   KETONESUR NEGATIVE 11/10/2006 0014   PROTEINUR Negative 10/16/2017 1501   PROTEINUR NEGATIVE 11/10/2006 0014   UROBILINOGEN 0.2 10/16/2017 1501   UROBILINOGEN 0.2 11/10/2006 0014   NITRITE Negative 10/16/2017 1501   NITRITE NEGATIVE 11/10/2006 0014   LEUKOCYTESUR Large (3+) (A) 10/16/2017 1501   Sepsis Labs Invalid input(s): PROCALCITONIN,  WBC,  LACTICIDVEN Microbiology Recent Results (from the past 240 hour(s))  Urine Culture     Status: Abnormal   Collection Time: 10/16/17  3:48 PM  Result Value Ref Range Status   MICRO NUMBER: [id number]  Final   SPECIMEN QUALITY: ADEQUATE  Final   Sample Source URINE  Final   STATUS: FINAL  Final   ISOLATE 1: Escherichia coli (A)  Final    Comment: 10,000-50,000 CFU/mL of Escherichia coli      Susceptibility   Escherichia coli - URINE CULTURE, REFLEX    AMOX/CLAVULANIC 8 Sensitive     AMPICILLIN >=32 Resistant     AMPICILLIN/SULBACTAM 4 Sensitive     CEFAZOLIN* <=4 Not Reportable      * For infections other than uncomplicated UTIcaused by E. coli, K. pneumoniae or P. mirabilis:Cefazolin is resistant if MIC > or = 8 mcg/mL.(Distinguishing susceptible versus intermediatefor isolates with MIC < or = 4 mcg/mL requiresadditional testing.)For uncomplicated UTI caused by E. coli,K. pneumoniae or P. mirabilis: Cefazolin issusceptible if MIC <32 mcg/mL and predictssusceptible to the oral agents cefaclor, cefdinir,cefpodoxime, cefprozil, cefuroxime, cephalexinand loracarbef.    CEFEPIME <=1 Sensitive     CEFTRIAXONE <=1 Sensitive     CIPROFLOXACIN >=4 Resistant     LEVOFLOXACIN 4 Intermediate     ERTAPENEM <=0.5 Sensitive     GENTAMICIN >=16 Resistant     IMIPENEM <=0.25 Sensitive     NITROFURANTOIN <=16 Sensitive     PIP/TAZO <=4 Sensitive     TOBRAMYCIN 8 Intermediate  TRIMETH/SULFA* <=20 Sensitive      * For infections other than uncomplicated UTIcaused by E.  coli, K. pneumoniae or P. mirabilis:Cefazolin is resistant if MIC > or = 8 mcg/mL.(Distinguishing susceptible versus intermediatefor isolates with MIC < or = 4 mcg/mL requiresadditional testing.)For uncomplicated UTI caused by E. coli,K. pneumoniae or P. mirabilis: Cefazolin issusceptible if MIC <32 mcg/mL and predictssusceptible to the oral agents cefaclor, cefdinir,cefpodoxime, cefprozil, cefuroxime, cephalexinand loracarbef.Legend:S = Susceptible  I = IntermediateR = Resistant  NS = Not susceptible* = Not tested  NR = Not reported**NN = See antimicrobic comments  MRSA PCR Screening     Status: Abnormal   Collection Time: 10/17/17  5:04 PM  Result Value Ref Range Status   MRSA by PCR POSITIVE (A) NEGATIVE Final    Comment:        The GeneXpert MRSA Assay (FDA approved for NASAL specimens only), is one component of a comprehensive MRSA colonization surveillance program. It is not intended to diagnose MRSA infection nor to guide or monitor treatment for MRSA infections. RESULT CALLED TO, READ BACK BY AND VERIFIED WITH: Randa Ngo RN [id number] 10/17/17 A BROWNING Performed at [facility], [address], [place], [place] [address]     Please note: You were cared for by a hospitalist during your hospital stay. Once you are discharged, your primary care physician will handle any further medical issues. Please note that NO REFILLS for any discharge medications will be authorized once you are discharged, as it is imperative that you return to your primary care physician (or establish a relationship with a primary care physician if you do not have one) for your post hospital discharge needs so that they can reassess your need for medications and monitor your lab values.   Time coordinating discharge: 40 minutes  SIGNED:   Joycelyn Das, MD  [facility] 10/19/2017, 8:59 AM

## 2017-10-19 NOTE — Discharge Instructions (Signed)
Information on my medicine - ELIQUIS (apixaban)  This medication education was reviewed with me or my healthcare representative as part of my discharge preparation.  The pharmacist that spoke with me during my hospital stay was:  Karstyn Birkey Kay, RPH  Why was Eliquis prescribed for you? Eliquis was prescribed to treat blood clots that may have been found in the veins of your legs (deep vein thrombosis) or in your lungs (pulmonary embolism) and to reduce the risk of them occurring again.  What do You need to know about Eliquis ? The starting dose is 10 mg (two 5 mg tablets) taken TWICE daily for the FIRST SEVEN (7) DAYS, then  the dose is reduced to ONE 5 mg tablet taken TWICE daily.  Eliquis may be taken with or without food.   Try to take the dose about the same time in the morning and in the evening. If you have difficulty swallowing the tablet whole please discuss with your pharmacist how to take the medication safely.  Take Eliquis exactly as prescribed and DO NOT stop taking Eliquis without talking to the doctor who prescribed the medication.  Stopping may increase your risk of developing a new blood clot.  Refill your prescription before you run out.  After discharge, you should have regular check-up appointments with your healthcare provider that is prescribing your Eliquis.    What do you do if you miss a dose? If a dose of ELIQUIS is not taken at the scheduled time, take it as soon as possible on the same day and twice-daily administration should be resumed. The dose should not be doubled to make up for a missed dose.  Important Safety Information A possible side effect of Eliquis is bleeding. You should call your healthcare provider right away if you experience any of the following: Bleeding from an injury or your nose that does not stop. Unusual colored urine (red or dark brown) or unusual colored stools (red or black). Unusual bruising for unknown reasons. A serious  fall or if you hit your head (even if there is no bleeding).  Some medicines may interact with Eliquis and might increase your risk of bleeding or clotting while on Eliquis. To help avoid this, consult your healthcare provider or pharmacist prior to using any new prescription or non-prescription medications, including herbals, vitamins, non-steroidal anti-inflammatory drugs (NSAIDs) and supplements.  This website has more information on Eliquis (apixaban): http://www.eliquis.com/eliquis/home  

## 2017-10-22 ENCOUNTER — Telehealth: Payer: Self-pay

## 2017-10-22 LAB — PROTHROMBIN GENE MUTATION

## 2017-10-22 LAB — FACTOR 5 LEIDEN

## 2017-10-22 NOTE — Telephone Encounter (Signed)
Transition Care Management Follow-up Telephone Call  Admit date: 10/17/2017  Discharge date: 10/19/2017 Principal Problem: Bilateral pulmonary embolism [facility])   How have you been since you were released from the hospital? "Much better, I can breathe"   Do you understand why you were in the hospital? yes, "multiple pulmonary embolism"   Do you understand the discharge instructions? yes   Where were you discharged to? Home.    Items Reviewed:  Medications reviewed: yes  Allergies reviewed: yes  Dietary changes reviewed: yes  Referrals reviewed: yes   Functional Questionnaire:   Activities of Daily Living (ADLs):   She states they are independent in the following: ambulation, bathing and hygiene, feeding, continence, grooming, toileting and dressing States they require assistance with the following: None   Any transportation issues/concerns?: no   Any patient concerns? no   Confirmed importance and date/time of follow-up visits scheduled yes  Provider Appointment booked with Malva Cogan, PA-C on 10/26/17 @ 11am.   Confirmed with patient if condition begins to worsen call PCP or go to the ER.  Patient was given the office number and encouraged to call back with question or concerns.  : yes

## 2017-10-26 ENCOUNTER — Other Ambulatory Visit: Payer: Self-pay

## 2017-10-26 ENCOUNTER — Ambulatory Visit (INDEPENDENT_AMBULATORY_CARE_PROVIDER_SITE_OTHER): Payer: 59 | Admitting: Physician Assistant

## 2017-10-26 ENCOUNTER — Encounter: Payer: Self-pay | Admitting: Physician Assistant

## 2017-10-26 VITALS — BP 90/64 | HR 78 | Temp 98.3°F | Resp 14 | Ht 63.0 in | Wt 129.0 lb

## 2017-10-26 DIAGNOSIS — D509 Iron deficiency anemia, unspecified: Secondary | ICD-10-CM | POA: Diagnosis not present

## 2017-10-26 DIAGNOSIS — I2699 Other pulmonary embolism without acute cor pulmonale: Secondary | ICD-10-CM | POA: Diagnosis not present

## 2017-10-26 DIAGNOSIS — D6852 Prothrombin gene mutation: Secondary | ICD-10-CM

## 2017-10-26 LAB — CBC WITH DIFFERENTIAL/PLATELET
BASOS PCT: 0.8 % (ref 0.0–3.0)
Basophils Absolute: 0.1 10*3/uL (ref 0.0–0.1)
EOS ABS: 0.1 10*3/uL (ref 0.0–0.7)
Eosinophils Relative: 0.7 % (ref 0.0–5.0)
HCT: 37.9 % (ref 36.0–46.0)
Hemoglobin: 12.9 g/dL (ref 12.0–15.0)
Lymphocytes Relative: 22.8 % (ref 12.0–46.0)
Lymphs Abs: 1.7 10*3/uL (ref 0.7–4.0)
MCHC: 34 g/dL (ref 30.0–36.0)
MCV: 96.8 fl (ref 78.0–100.0)
Monocytes Absolute: 0.4 10*3/uL (ref 0.1–1.0)
Monocytes Relative: 5.8 % (ref 3.0–12.0)
NEUTROS ABS: 5.4 10*3/uL (ref 1.4–7.7)
Neutrophils Relative %: 69.9 % (ref 43.0–77.0)
PLATELETS: 339 10*3/uL (ref 150.0–400.0)
RBC: 3.91 Mil/uL (ref 3.87–5.11)
RDW: 12 % (ref 11.5–15.5)
WBC: 7.7 10*3/uL (ref 4.0–10.5)

## 2017-10-26 LAB — COMPREHENSIVE METABOLIC PANEL
ALT: 12 U/L (ref 0–35)
AST: 14 U/L (ref 0–37)
Albumin: 4 g/dL (ref 3.5–5.2)
Alkaline Phosphatase: 73 U/L (ref 39–117)
BUN: 14 mg/dL (ref 6–23)
CHLORIDE: 103 meq/L (ref 96–112)
CO2: 27 meq/L (ref 19–32)
CREATININE: 0.83 mg/dL (ref 0.40–1.20)
Calcium: 9.1 mg/dL (ref 8.4–10.5)
GFR: 80.77 mL/min (ref >60.00)
GLUCOSE: 97 mg/dL (ref 70–99)
Potassium: 4.2 mEq/L (ref 3.5–5.1)
SODIUM: 139 meq/L (ref 135–145)
Total Bilirubin: 0.5 mg/dL (ref 0.2–1.2)
Total Protein: 6.9 g/dL (ref 6.0–8.3)

## 2017-10-26 MED ORDER — APIXABAN 5 MG PO TABS: 5.0000 mg | ORAL_TABLET | Freq: Two times a day (BID) | ORAL | 1 refills | Status: DC

## 2017-10-26 NOTE — Progress Notes (Signed)
Patient presents to clinic today for hospital follow-up. Patient was seen in the ER on 10/17/17 with c/o shortness of breath and chest pain. ER workup included elevated d-dimer and CTA revealing bilateral moderate-burden pulmonary emboli. Was started on Heparin Drip andsubsequently admitted to [facility] for further assessment and management. Hospital course is summarized as follows. For detailed report, please refer to Hospitalist notes). Patient remained hemodynamically stable. Chest pain resolved during hospitalization. No further hypoxia noted. Patient transitioned to oral anticoagulation -- started on Eliquis starter pack. Will need to complete 3 months of this, possibly further. US DVT obtained in hospital and negative for DVT.  Since discharge, patient endorses doing very well overall. Is completing her starter pack for Eliquis. Is taking as directed and notes tolerating well. Denies shortness of breath with rest or exertion. Occasional mild residual chest pain with deep breath but markedly improved from onset. Denies any swelling of extremities. Is eating and hydrating well. Notes regular urinary output and regular frequency of bowels. Has noted that since being taken off of her OCP, she has started and is finishing a menstrual period. Was taking OTC iron supplement during menses and noting some dark stools with this that is now resolving. Denies BRBPR. Denies tenesmus. Patient denies chest pain, palpitations, lightheadedness, dizziness, vision changes or frequent headaches.   Past Medical History:  Diagnosis Date  . AMA (advanced maternal age) multigravida 31+   . Arrhythmia    Mobitz type 1  . Chicken pox   . Depression   . Depression with anxiety   . Frequent headaches   . Heart murmur    as a child  . Tachycardia    as a child  . Thyroid disease   . Urinary incontinence     Current Outpatient Medications on File Prior to Visit  Medication Sig Dispense Refill  .  Prenatal Vit-Fe Fumarate-FA (PRENATAL MULTIVITAMIN) TABS tablet Take 1 tablet by mouth daily at 12 noon.     No current facility-administered medications on file prior to visit.     Allergies  Allergen Reactions  . Sulfa Antibiotics Anaphylaxis and Hives  . Erythromycin Other (See Comments)    unknown As a child, unknown reaction  . Morphine Other (See Comments)    aggressiveness  . Morphine And Related Itching and Other (See Comments)    hallucinations    Family History  Problem Relation Age of Onset  . Diabetes Mother   . Arthritis Mother   . Depression Mother   . Stroke Mother   . Miscarriages / [place] Mother   . Mental illness Mother   . Hyperlipidemia Father   . Hypertension Father   . Alcohol abuse Father   . Arthritis Father   . Depression Father   . Drug abuse Father   . Hearing loss Father   . Cancer Paternal Aunt   . Cancer Paternal Grandmother   . Ovarian cancer Paternal Grandmother   . Breast cancer Paternal Grandmother   . Mental illness Sister   . Asthma Brother   . Depression Brother   . Asthma Son   . Depression Maternal Grandmother   . Diabetes Maternal Grandmother   . Heart disease Maternal Grandfather   . Kidney disease Maternal Grandfather   . Stroke Maternal Grandfather   . Alcohol abuse Paternal Grandfather   . Asthma Paternal Grandfather   . Depression Paternal Grandfather   . Drug abuse Paternal Grandfather   . Hearing loss Paternal Grandfather   .  Heart disease Paternal Grandfather   . Hyperlipidemia Paternal Grandfather   . Mental illness Paternal Grandfather     Social History   Socioeconomic History  . Marital status: Married    Spouse name: Not on file  . Number of children: Not on file  . Years of education: Not on file  . Highest education level: Not on file  Occupational History  . Not on file  Social Needs  . Financial resource strain: Not on file  . Food insecurity:    Worry: Not on file    Inability: Not on  file  . Transportation needs:    Medical: Not on file    Non-medical: Not on file  Tobacco Use  . Smoking status: Former Research scientist (life sciences)  . Smokeless tobacco: Never Used  Substance and Sexual Activity  . Alcohol use: Yes  . Drug use: No  . Sexual activity: Yes    Partners: Male    Birth control/protection: None  Lifestyle  . Physical activity:    Days per week: Not on file    Minutes per session: Not on file  . Stress: Not on file  Relationships  . Social connections:    Talks on phone: Not on file    Gets together: Not on file    Attends religious service: Not on file    Active member of club or organization: Not on file    Attends meetings of clubs or organizations: Not on file    Relationship status: Not on file  Other Topics Concern  . Not on file  Social History Narrative   Married, college-educated.  Works in Press photographer.  4 children.   Social drinker.  Former smoker.   Exercises 3 times a week.   Smoke alarm in the home.  Wears her seatbelt.   Feels safe in her relationships.   Review of Systems - See HPI.  All other ROS are negative.  BP 90/64   Pulse 78   Temp 98.3 F (36.8 C) (Oral)   Resp 14   Ht _0  (1.6 m)   Wt 129 lb (58.5 kg)   SpO2 98%   Breastfeeding? No   BMI 22.85 kg/m   Physical Exam  Constitutional: She is oriented to person, place, and time. She appears well-developed and well-nourished.  HENT:  Head: Normocephalic and atraumatic.  Right Ear: External ear normal.  Left Ear: External ear normal.  Mouth/Throat: Oropharynx is clear and moist.  Eyes: Pupils are equal, round, and reactive to light.  Cardiovascular: Normal rate, regular rhythm, normal heart sounds and intact distal pulses.  Pulses:      Popliteal pulses are 2+ on the right side.       Dorsalis pedis pulses are 2+ on the right side, and 2+ on the left side.       Posterior tibial pulses are 2+ on the right side, and 2+ on the left side.  No evidence of peripheral edema    Pulmonary/Chest: Effort normal and breath sounds normal. No stridor. No respiratory distress. She has no wheezes. She has no rales. She exhibits no tenderness.  Neurological: She is alert and oriented to person, place, and time.  Psychiatric: She has a normal mood and affect.  Vitals reviewed.   Recent Results (from the past 2160 hour(s))  D-Dimer, Quantitative     Status: Abnormal   Collection Time: 10/16/17  2:54 PM  Result Value Ref Range   D-Dimer, Quant 4.30 (H) <0.50 mcg/mL FEU  Comment: . The D-Dimer test is used frequently to exclude an acute PE or DVT. In patients with a low to moderate clinical risk assessment and a D-Dimer result <0.50 mcg/mL FEU, the likelihood of a PE or DVT is very low. However, a thromboembolic event should not be excluded solely on the basis of the D-Dimer level. Increased levels of D-Dimer are associated with a PE, DVT, DIC, malignancies, inflammation, sepsis, surgery, trauma, pregnancy, and advancing patient age. [Jama 2006 11:295(2):199-207] . For additional information, please refer to: http://education.questdiagnostics.com/faq/FAQ149 (This link is being provided for informational/ educational purposes only) .   CBC     Status: None   Collection Time: 10/16/17  2:54 PM  Result Value Ref Range   WBC 8.8 3.8 - 10.8 Thousand/uL   RBC 4.37 3.80 - 5.10 Million/uL   Hemoglobin 14.3 11.7 - 15.5 g/dL   HCT 41.5 35.0 - 45.0 %   MCV 95.0 80.0 - 100.0 fL   MCH 32.7 27.0 - 33.0 pg   MCHC 34.5 32.0 - 36.0 g/dL   RDW 11.2 11.0 - 15.0 %   Platelets 347 140 - 400 Thousand/uL   MPV 10.2 7.5 - 12.5 fL  Iron, TIBC and Ferritin Panel     Status: Abnormal   Collection Time: 10/16/17  2:54 PM  Result Value Ref Range   Iron 25 (L) 40 - 190 mcg/dL   TIBC 393 250 - 450 mcg/dL (calc)   %SAT 6 (L) 16 - 45 % (calc)   Ferritin 185 (H) 16 - 154 ng/mL  Comp Met (CMET)     Status: None   Collection Time: 10/16/17  2:54 PM  Result Value Ref Range    Glucose, Bld 92 65 - 99 mg/dL    Comment: .            Fasting reference interval .    BUN 11 7 - 25 mg/dL   Creat 0.86 0.50 - 1.10 mg/dL   BUN/Creatinine Ratio NOT APPLICABLE 6 - 22 (calc)   Sodium 140 135 - 146 mmol/L   Potassium 4.9 3.5 - 5.3 mmol/L   Chloride 104 98 - 110 mmol/L   CO2 25 20 - 32 mmol/L   Calcium 10.0 8.6 - 10.2 mg/dL   Total Protein 8.1 6.1 - 8.1 g/dL   Albumin 4.4 3.6 - 5.1 g/dL   Globulin 3.7 1.9 - 3.7 g/dL (calc)   AG Ratio 1.2 1.0 - 2.5 (calc)   Total Bilirubin 0.4 0.2 - 1.2 mg/dL   Alkaline phosphatase (APISO) 84 33 - 115 U/L   AST 14 10 - 30 U/L   ALT 12 6 - 29 U/L  TSH     Status: None   Collection Time: 10/16/17  2:54 PM  Result Value Ref Range   TSH 1.29 mIU/L    Comment:           Reference Range .           > or = 20 Years  0.40-4.50 .                Pregnancy Ranges           First trimester    0.26-2.66           Second trimester   0.55-2.73           Third trimester    0.43-2.91   POCT Urinalysis Dipstick (Automated)     Status: Abnormal   Collection Time: 10/16/17  3:01 PM  Result Value Ref Range   Color, UA yellow    Clarity, UA clear    Glucose, UA Negative Negative   Bilirubin, UA Negative    Ketones, UA Negative    Spec Grav, UA 1.010 1.010 - 1.025   Blood, UA Negative    pH, UA 8.0 5.0 - 8.0   Protein, UA Negative Negative   Urobilinogen, UA 0.2 0.2 or 1.0 E.U./dL   Nitrite, UA Negative    Leukocytes, UA Large (3+) (A) Negative  Urine Culture     Status: Abnormal   Collection Time: 10/16/17  3:48 PM  Result Value Ref Range   MICRO NUMBER: [id number]    SPECIMEN QUALITY: ADEQUATE    Sample Source URINE    STATUS: FINAL    ISOLATE 1: Escherichia coli (A)     Comment: 10,000-50,000 CFU/mL of Escherichia coli      Susceptibility   Escherichia coli - URINE CULTURE, REFLEX    AMOX/CLAVULANIC 8 Sensitive     AMPICILLIN >=32 Resistant     AMPICILLIN/SULBACTAM 4 Sensitive     CEFAZOLIN* <=4 Not Reportable      * For  infections other than uncomplicated UTIcaused by E. coli, K. pneumoniae or P. mirabilis:Cefazolin is resistant if MIC > or = 8 mcg/mL.(Distinguishing susceptible versus intermediatefor isolates with MIC < or = 4 mcg/mL requiresadditional testing.)For uncomplicated UTI caused by E. coli,K. pneumoniae or P. mirabilis: Cefazolin issusceptible if MIC <32 mcg/mL and predictssusceptible to the oral agents cefaclor, cefdinir,cefpodoxime, cefprozil, cefuroxime, cephalexinand loracarbef.    CEFEPIME <=1 Sensitive     CEFTRIAXONE <=1 Sensitive     CIPROFLOXACIN >=4 Resistant     LEVOFLOXACIN 4 Intermediate     ERTAPENEM <=0.5 Sensitive     GENTAMICIN >=16 Resistant     IMIPENEM <=0.25 Sensitive     NITROFURANTOIN <=16 Sensitive     PIP/TAZO <=4 Sensitive     TOBRAMYCIN 8 Intermediate     TRIMETH/SULFA* <=20 Sensitive      * For infections other than uncomplicated UTIcaused by E. coli, K. pneumoniae or P. mirabilis:Cefazolin is resistant if MIC > or = 8 mcg/mL.(Distinguishing susceptible versus intermediatefor isolates with MIC < or = 4 mcg/mL requiresadditional testing.)For uncomplicated UTI caused by E. coli,K. pneumoniae or P. mirabilis: Cefazolin issusceptible if MIC <32 mcg/mL and predictssusceptible to the oral agents cefaclor, cefdinir,cefpodoxime, cefprozil, cefuroxime, cephalexinand loracarbef.Legend:S = Susceptible  I = IntermediateR = Resistant  NS = Not susceptible* = Not tested  NR = Not reported**NN = See antimicrobic comments  CBC with Differential     Status: None   Collection Time: 10/17/17  9:34 AM  Result Value Ref Range   WBC 7.8 4.0 - 10.5 K/uL   RBC 4.23 3.87 - 5.11 MIL/uL   Hemoglobin 13.7 12.0 - 15.0 g/dL   HCT 41.6 36.0 - 46.0 %   MCV 98.3 78.0 - 100.0 fL   MCH 32.4 26.0 - 34.0 pg   MCHC 32.9 30.0 - 36.0 g/dL   RDW 11.6 11.5 - 15.5 %   Platelets 321 150 - 400 K/uL   Neutrophils Relative % 66 %   Neutro Abs 5.2 1.7 - 7.7 K/uL   Lymphocytes Relative 26 %   Lymphs Abs 2.0 0.7 -  4.0 K/uL   Monocytes Relative 6 %   Monocytes Absolute 0.4 0.1 - 1.0 K/uL   Eosinophils Relative 1 %   Eosinophils Absolute 0.1 0.0 - 0.7 K/uL   Basophils Relative 0 %   Basophils Absolute 0.0  0.0 - 0.1 K/uL   Immature Granulocytes 1 %   Abs Immature Granulocytes 0.0 0.0 - 0.1 K/uL    Comment: Performed at [facility], [place] [address], [place], [place] [address]  Brain natriuretic peptide     Status: None   Collection Time: 10/17/17  9:34 AM  Result Value Ref Range   B Natriuretic Peptide 33.4 0.0 - 100.0 pg/mL    Comment: Performed at [facility] [address], [place], [place] [address]  I-stat troponin, ED     Status: None   Collection Time: 10/17/17  9:43 AM  Result Value Ref Range   Troponin i, poc 0.00 0.00 - 0.08 ng/mL   Comment 3            Comment: Due to the release kinetics of cTnI, a negative result within the first hours of the onset of symptoms does not rule out myocardial infarction with certainty. If myocardial infarction is still suspected, repeat the test at appropriate intervals.   I-stat Chem 8, ED     Status: Abnormal   Collection Time: 10/17/17  9:45 AM  Result Value Ref Range   Sodium 140 135 - 145 mmol/L   Potassium 4.0 3.5 - 5.1 mmol/L   Chloride 105 98 - 111 mmol/L   BUN 13 6 - 20 mg/dL   Creatinine, Ser 0.90 0.44 - 1.00 mg/dL   Glucose, Bld 101 (H) 70 - 99 mg/dL   Calcium, Ion 1.13 (L) 1.15 - 1.40 mmol/L   TCO2 20 (L) 22 - 32 mmol/L   Hemoglobin 13.6 12.0 - 15.0 g/dL   HCT 40.0 36.0 - 46.0 %  POC urine preg, ED (not at [facility])     Status: None   Collection Time: 10/17/17 10:08 AM  Result Value Ref Range   Preg Test, Ur NEGATIVE NEGATIVE    Comment:        THE SENSITIVITY OF THIS METHODOLOGY IS >24 mIU/mL   Troponin I (q 6hr x 3)     Status: None   Collection Time: 10/17/17 12:21 PM  Result Value Ref Range   Troponin I <0.03 <0.03 ng/mL    Comment: Performed at [facility], [address], [place], [place] [address]   HIV antibody (Routine Testing)     Status: None   Collection Time: 10/17/17 12:21 PM  Result Value Ref Range   HIV Screen 4th Generation wRfx Non Reactive Non Reactive    Comment: (NOTE) Performed At: [facility] [place], [place] [id number] Rush Farmer MD [id number] Performed at [facility], [place] [address], [place], [place] [address]   ECHOCARDIOGRAM COMPLETE     Status: None   Collection Time: 10/17/17  3:40 PM  Result Value Ref Range   Weight 2,048 oz   Height 63 in   BP 110/71 mmHg  Antithrombin III     Status: None   Collection Time: 10/17/17  3:47 PM  Result Value Ref Range   AntiThromb III Func 93 75 - 120 %    Comment: Performed at [facility] [address] [place], [place] [address]  Protein C activity     Status: None   Collection Time: 10/17/17  3:47 PM  Result Value Ref Range   Protein C Activity 135 73 - 180 %    Comment: (NOTE) Performed At: [facility] [place], [place] [id number] Rush Farmer MD [id number] Performed at [facility], [place] [address] [place], [place] [address]   Protein C, total     Status: None   Collection Time: 10/17/17  3:47 PM  Result Value Ref Range   Protein C, Total 102 60 - 150 %    Comment: (NOTE) Performed At: [facility] [place], [place] [id number] Rush Farmer MD [id number] Performed at [facility], [place] [address], [place], [place] [address]   Protein S activity     Status: None   Collection Time: 10/17/17  3:47 PM  Result Value Ref Range   Protein S Activity 77 63 - 140 %    Comment: (NOTE) Protein S activity may be falsely increased (masking an abnormal, low result) in patients receiving direct Xa inhibitor (e.g., rivaroxaban, apixaban, edoxaban) or a direct thrombin inhibitor (e.g., dabigatran) anticoagulant treatment due to assay interference by these drugs. Performed At: [facility] [place], [place] [id number] Rush Farmer MD [id number] Performed at [facility], [place] [address], [place], [place] [address]   Protein S, total     Status: None   Collection Time: 10/17/17  3:47 PM  Result Value Ref Range   Protein S Ag, Total 85 60 - 150 %    Comment: (NOTE) This test was developed and its performance characteristics determined by [facility]. It has not been cleared or approved by the Food and Drug Administration. Performed At: [facility] [place], [place] [id number] Rush Farmer MD OE:[id number] Performed at [facility], [place] [address] [place], [place] [address]   Lupus anticoagulant panel     Status: None   Collection Time: 10/17/17  3:47 PM  Result Value Ref Range   PTT Lupus Anticoagulant 45.0 0.0 - 51.9 sec   DRVVT 40.1 0.0 - 47.0 sec   Lupus Anticoag Interp Comment:     Comment: (NOTE) No lupus anticoagulant was detected. Performed At: [facility] [place], [place] [id number] Rush Farmer MD TO:[id number] Performed at [facility], [place] [address] [place], [place] [address]   Beta-2-glycoprotein i abs, IgG/M/A     Status: None   Collection Time: 10/17/17  3:47 PM  Result Value Ref Range   Beta-2 Glyco I IgG <9 0 - 20 GPI IgG units    Comment: (NOTE) The reference interval reflects a 3SD or 99th percentile interval, which is thought to represent a potentially clinically significant result in accordance with the International Consensus Statement on the classification criteria for definitive antiphospholipid syndrome (APS). J Thromb Haem 2006;4:295-306.    Beta-2-Glycoprotein I IgM <9 0 - 32 GPI IgM units    Comment: (NOTE) The reference interval reflects a 3SD or 99th percentile interval, which is thought to represent a potentially clinically significant result in accordance with the International Consensus Statement on the classification criteria for definitive  antiphospholipid syndrome (APS). J Thromb Haem 2006;4:295-306. Performed At: [facility] [place], [place] [id number] Rush Farmer MD [id number]    Beta-2-Glycoprotein I IgA 9 0 - 25 GPI IgA units    Comment: (NOTE) The reference interval reflects a 3SD or 99th percentile interval, which is thought to represent a potentially clinically significant result in accordance with the International Consensus Statement on the classification criteria for definitive antiphospholipid syndrome (APS). J Thromb Haem 2006;4:295-306. Performed at [facility], [place] [address], [place], [place] [address]   Factor 5 leiden     Status: None  Collection Time: 10/17/17  3:47 PM  Result Value Ref Range   Recommendations-F5LEID: Comment     Comment: (NOTE) Result:  Negative (no mutation found) Factor V Leiden is a specific mutation (R506Q) in the factor V gene that is associated with an increased risk of venous thrombosis. Factor V Leiden is more resistant to inactivation by activated protein C.  As a result, factor V persists in the circulation leading to a mild hyper- coagulable state.  The Leiden mutation accounts for 90% - 95% of APC resistance.  Factor V Leiden has been reported in patients with deep vein thrombosis, pulmonary embolus, central retinal vein occlusion, cerebral sinus thrombosis and hepatic vein thrombosis. Other risk factors to be considered in the workup for venous thrombosis include the G20210A mutation in the factor II (prothrombin) gene, protein S and C deficiency, and antithrombin deficiencies. Anticardiolipin antibody and lupus anticoagulant analysis may be appropriate for certain patients, as well as homocysteine levels. Contact your local [facility] for information on how to order additi onal testing if desired. **Genetic counselors are available for health care providers to**  discuss results at [phone]-GENE [phone]). Methodology: DNA  analysis of the Factor V gene was performed by allele-specific PCR. The diagnostic sensitivity and specificity is >99% for both. Molecular-based testing is highly accurate, but as in any laboratory test, diagnostic errors may occur. All test results must be combined with clinical information for the most accurate interpretation. This test was developed and its performance characteristics determined by [facility]. It has not been cleared or approved by the Food and Drug Administration. References: Voelkerding K (1996).  Clin Lab Med [phone]. Allison Quarry, PhD, [facility] Ruben Reason, PhD, [facility] Annetta Maw, M.S., PhD, [facility] Alfredo Bach, PhD, [facility] Norva Riffle, PhD, [facility] Earlean Polka PhD, [facility] Performed At: [facility] [address] [place], [place] [id number] Nechama Guard MD [id number] Performed at [facility], [place] [address], [place], [place] [address]   Prothrombin gene mutation     Status: Abnormal   Collection Time: 10/17/17  3:47 PM  Result Value Ref Range   Recommendations-PTGENE: Comment (A)     Comment: (NOTE) 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 The incidence of this mutation in the [place] Caucasian population is about 2% and in the [place] American population it is approximately 0.5%. This mutation is rare in the [place] and Native American population. Being heterozygous for a prothrombin mutation increases the risk for developing venous thrombosis about 2 to 3 times above the general population risk. Being homozygous for the prothrombin gene mutation increases the relative risk for venous thrombosis further, although it is not yet known how much further the risk is increased. In women heterozygous for the prothrombin gene mutation, the use of estrogen containing oral contraceptives  increases the relative risk of venous thrombosis about 16 times and the risk of developing cerebral thrombosis is also significantly increased. In  pregnancy the prothrombin gene mutation increases risk for venous thrombosis and may increase risk for stillbirth, placental abruption, pre-eclampsia and fetal growth restriction. If the patient possesses two or more congenital or acquired thrombophilic risk factors, the risk for thrombosis may rise to more than the sum of the risk ratios for the individual mutations. This assay detects only the prothrombin G20210A mutation and does not measure genetic abnormalities elsewhere in the genome. Other thrombotic risk factors may be  pursued through systematic clinical laboratory analysis. These factors include the R506Q (Leiden) mutation in the Factor V gene, plasma homocysteine levels, as well as testing for deficiencies of antithrombin III, protein C and protein S. Genetic Counselors are available for health care providers to discuss results at [phone]-GENE [phone]). Methodology: DNA analysis of the Factor II gene was performed by PCR amplification followed by restriction  analysis. The diagnostic sensitivity is >99% for both. All the tests must be combined with clinical information for the most accurate interpretation. Molecular-based testing is highly accurate, but as in any laboratory test, diagnostic errors may occur. This test was developed and its performance characteristics determined by [facility]. It has not been cleared or approved by the Food and Drug Administration. Poort SR, et al. Blood. 1996; 23:7628-3151. Varga EA. Circulation. 2004; 761:Y07-P71. Mervin Hack, et Kennedy; 19:700-703. Allison Quarry, PhD, [facility] Ruben Reason, PhD, [facility] Annetta Maw, M.S., PhD, [facility] Alfredo Bach, PhD, [facility] Norva Riffle, PhD, [facility] Earlean Polka, PhD, [facility] Performed At: [facility] [place], [place] [id number] Nechama Guard MD OE:[id number] Performed at [facility], [place] [address], [place], [place] [address]   Cardiolipin antibodies, IgG, IgM, IgA     Status: None   Collection Time: 10/17/17  3:47 PM  Result Value Ref Range   Anticardiolipin IgG <9 0 - 14 GPL U/mL    Comment: (NOTE)                          Negative:              <15                          Indeterminate:     15 - 20                          Low-Med Positive: >20 - 80                          High Positive:         >80    Anticardiolipin IgM <9 0 - 12 MPL U/mL    Comment: (NOTE)                          Negative:              <13                          Indeterminate:     13 - 20                          Low-Med Positive: >20 - 80                          High Positive:         >80    Anticardiolipin IgA <9 0 - 11 APL U/mL    Comment: (NOTE)                          Negative:              <12  Indeterminate:     12 - 20                          Low-Med Positive: >20 - 80                          High Positive:         >80 Performed At: [facility] [place], [place] [id number] Rush Farmer MD [id number] Performed at [facility], [place] [address] [place], [place] [address]   Homocysteine, serum     Status: None   Collection Time: 10/17/17  3:50 PM  Result Value Ref Range   Homocysteine 12.8 0.0 - 15.0 umol/L    Comment: (NOTE) Performed At: [facility] [address] [place], [place] [id number] Rush Farmer MD [id number] Performed at [facility], [place] [address] [place], [place] [address]   MRSA PCR Screening     Status: Abnormal   Collection Time: 10/17/17  5:04 PM  Result Value Ref Range   MRSA by PCR POSITIVE (A) NEGATIVE    Comment:        The GeneXpert MRSA Assay (FDA approved for NASAL specimens only), is one component of a comprehensive MRSA colonization surveillance  program. It is not intended to diagnose MRSA infection nor to guide or monitor treatment for MRSA infections. RESULT CALLED TO, READ BACK BY AND VERIFIED WITH: Guy Sandifer RN [id number] 10/17/17 A BROWNING Performed at [facility], [place] [address] [place], [place] [address]   Troponin I (q 6hr x 3)     Status: None   Collection Time: 10/17/17  5:39 PM  Result Value Ref Range   Troponin I <0.03 <0.03 ng/mL    Comment: Performed at [facility] [address], [place], [place] [address]  Heparin level (unfractionated)     Status: Abnormal   Collection Time: 10/17/17  5:39 PM  Result Value Ref Range   Heparin Unfractionated <0.10 (L) 0.30 - 0.70 IU/mL    Comment: (NOTE) If heparin results are below expected values, and patient dosage has  been confirmed, suggest follow up testing of antithrombin III levels. Performed at [facility], [place] [address] [place], [place] [address]   Troponin I (q 6hr x 3)     Status: None   Collection Time: 10/17/17 11:43 PM  Result Value Ref Range   Troponin I <0.03 <0.03 ng/mL    Comment: Performed at [facility] [address] [place], [place] [address]  CBC     Status: Abnormal   Collection Time: 10/18/17 12:12 AM  Result Value Ref Range   WBC 7.5 4.0 - 10.5 K/uL   RBC 3.61 (L) 3.87 - 5.11 MIL/uL   Hemoglobin 11.7 (L) 12.0 - 15.0 g/dL   HCT 35.2 (L) 36.0 - 46.0 %   MCV 97.5 78.0 - 100.0 fL   MCH 32.4 26.0 - 34.0 pg   MCHC 33.2 30.0 - 36.0 g/dL   RDW 11.6 11.5 - 15.5 %   Platelets 285 150 - 400 K/uL    Comment: Performed at [facility], [place] [address] [place],  [address]  Basic metabolic panel     Status: Abnormal   Collection Time: 10/18/17 12:12 AM  Result Value Ref Range   Sodium 141 135 - 145 mmol/L   Potassium 4.2 3.5 - 5.1 mmol/L  Chloride 108 98 - 111 mmol/L    Comment: Please note change in reference range.   CO2 25 22 - 32 mmol/L   Glucose, Bld 116 (H) 70 - 99 mg/dL    Comment: Please note  change in reference range.   BUN 12 6 - 20 mg/dL    Comment: Please note change in reference range.   Creatinine, Ser 0.89 0.44 - 1.00 mg/dL   Calcium 8.6 (L) 8.9 - 10.3 mg/dL   GFR calc non Af Amer >60 >60 mL/min   GFR calc Af Amer >60 >60 mL/min    Comment: (NOTE) The eGFR has been calculated using the CKD EPI equation. This calculation has not been validated in all clinical situations. eGFR's persistently <60 mL/min signify possible Chronic Kidney Disease.    Anion gap 8 5 - 15    Comment: Performed at [facility] [address], [place], [place] [address]  Heparin level (unfractionated)     Status: None   Collection Time: 10/18/17  2:20 AM  Result Value Ref Range   Heparin Unfractionated 0.32 0.30 - 0.70 IU/mL    Comment: (NOTE) If heparin results are below expected values, and patient dosage has  been confirmed, suggest follow up testing of antithrombin III levels. Performed at [facility], [place] [address], [place], [place] [address]   Heparin level (unfractionated)     Status: None   Collection Time: 10/18/17  9:55 AM  Result Value Ref Range   Heparin Unfractionated 0.47 0.30 - 0.70 IU/mL    Comment: (NOTE) If heparin results are below expected values, and patient dosage has  been confirmed, suggest follow up testing of antithrombin III levels. Performed at [facility], [place] [address], [place], [place] [address]   CBC     Status: Abnormal   Collection Time: 10/18/17  9:55 AM  Result Value Ref Range   WBC 6.3 4.0 - 10.5 K/uL   RBC 3.82 (L) 3.87 - 5.11 MIL/uL   Hemoglobin 12.2 12.0 - 15.0 g/dL   HCT 37.6 36.0 - 46.0 %   MCV 98.4 78.0 - 100.0 fL   MCH 31.9 26.0 - 34.0 pg   MCHC 32.4 30.0 - 36.0 g/dL   RDW 11.7 11.5 - 15.5 %   Platelets 295 150 - 400 K/uL    Comment: Performed at [facility], [place] [address] [place], [place] [address]  Heparin level (unfractionated)     Status: None   Collection Time: 10/19/17  3:30 AM  Result Value Ref  Range   Heparin Unfractionated 0.39 0.30 - 0.70 IU/mL    Comment: (NOTE) If heparin results are below expected values, and patient dosage has  been confirmed, suggest follow up testing of antithrombin III levels. Performed at [facility], [place] [address], [place], [place] [address]   CBC     Status: Abnormal   Collection Time: 10/19/17  3:30 AM  Result Value Ref Range   WBC 5.6 4.0 - 10.5 K/uL   RBC 3.71 (L) 3.87 - 5.11 MIL/uL   Hemoglobin 12.0 12.0 - 15.0 g/dL   HCT 36.6 36.0 - 46.0 %   MCV 98.7 78.0 - 100.0 fL   MCH 32.3 26.0 - 34.0 pg   MCHC 32.8 30.0 - 36.0 g/dL   RDW 11.6 11.5 - 15.5 %   Platelets 263 150 - 400 K/uL    Comment: Performed at [facility], [place],  [place] [address]  Basic metabolic panel     Status: Abnormal   Collection Time: 10/19/17  3:30 AM  Result Value Ref Range   Sodium 141 135 - 145 mmol/L   Potassium 4.1 3.5 - 5.1 mmol/L   Chloride 110 98 - 111 mmol/L    Comment: Please note change in reference range.   CO2 24 22 - 32 mmol/L   Glucose, Bld 126 (H) 70 - 99 mg/dL    Comment: Please note change in reference range.   BUN 12 6 - 20 mg/dL    Comment: Please note change in reference range.   Creatinine, Ser 1.01 (H) 0.44 - 1.00 mg/dL   Calcium 8.7 (L) 8.9 - 10.3 mg/dL   GFR calc non Af Amer >60 >60 mL/min   GFR calc Af Amer >60 >60 mL/min    Comment: (NOTE) The eGFR has been calculated using the CKD EPI equation. This calculation has not been validated in all clinical situations. eGFR's persistently <60 mL/min signify possible Chronic Kidney Disease.    Anion gap 7 5 - 15    Comment: Performed at [facility] [address] [place], [place] [address]  CBC w/Diff     Status: None   Collection Time: 10/26/17 11:42 AM  Result Value Ref Range   WBC 7.7 4.0 - 10.5 K/uL   RBC 3.91 3.87 - 5.11 Mil/uL   Hemoglobin 12.9 12.0 - 15.0 g/dL   HCT 37.9 36.0 - 46.0 %   MCV 96.8 78.0 - 100.0 fl   MCHC 34.0 30.0 - 36.0  g/dL   RDW 12.0 11.5 - 15.5 %   Platelets 339.0 150.0 - 400.0 K/uL   Neutrophils Relative % 69.9 43.0 - 77.0 %   Lymphocytes Relative 22.8 12.0 - 46.0 %   Monocytes Relative 5.8 3.0 - 12.0 %   Eosinophils Relative 0.7 0.0 - 5.0 %   Basophils Relative 0.8 0.0 - 3.0 %   Neutro Abs 5.4 1.4 - 7.7 K/uL   Lymphs Abs 1.7 0.7 - 4.0 K/uL   Monocytes Absolute 0.4 0.1 - 1.0 K/uL   Eosinophils Absolute 0.1 0.0 - 0.7 K/uL   Basophils Absolute 0.1 0.0 - 0.1 K/uL  Comp Met (CMET)     Status: None   Collection Time: 10/26/17 11:42 AM  Result Value Ref Range   Sodium 139 135 - 145 mEq/L   Potassium 4.2 3.5 - 5.1 mEq/L   Chloride 103 96 - 112 mEq/L   CO2 27 19 - 32 mEq/L   Glucose, Bld 97 70 - 99 mg/dL   BUN 14 6 - 23 mg/dL   Creatinine, Ser 0.83 0.40 - 1.20 mg/dL   Total Bilirubin 0.5 0.2 - 1.2 mg/dL   Alkaline Phosphatase 73 39 - 117 U/L   AST 14 0 - 37 U/L   ALT 12 0 - 35 U/L   Total Protein 6.9 6.0 - 8.3 g/dL   Albumin 4.0 3.5 - 5.2 g/dL   Calcium 9.1 8.4 - 10.5 mg/dL   GFR 80.77 >60.00 mL/min    Assessment/Plan: Bilateral pulmonary embolism (HCC) Examination unremarkable today. Vitals stable. No symptoms at rest or with exertion. Tolerating Eliquis well. Is completing starter pack currently. Rx given for 5 mg Eliquis BID to continue for at least 3 months. Will continue off of OCPS for now. 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  Will check CBC w diff today and Hemoccult  giving darker stools noted. Suspect related to iron use but need to be more cautious giving anticoagulation currently. Strict ER precautions reviewed with patient and husband.   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  Negative Protein C and S testing. Recommended to be repeated in the future after acuity of clot has changed as these levels can be falsely negative with acute clots. Will defer to PCP to decide if they want to follow on  this or have her see Hematology.     Leeanne Rio, PA-C

## 2017-10-26 NOTE — Patient Instructions (Signed)
Please go to the lab today for blood work.  I will call you with your results. We will alter treatment regimen(s) if indicated by your results.   Make sure to keep hydrated.  Add on an electrolyte water or a bit of salt daily. The darkness in stool I am hoping is related to your iron supplement but we are assessing for blood in the stool since you are on an blood thinner. We will alter treatment according to results.  If you note any recurring chest pain or worsening shortness of breath or lightheadedness, please return to the ER.

## 2017-10-29 ENCOUNTER — Encounter: Payer: Self-pay | Admitting: Family Medicine

## 2017-10-29 ENCOUNTER — Other Ambulatory Visit: Payer: Self-pay

## 2017-10-29 DIAGNOSIS — D6852 Prothrombin gene mutation: Secondary | ICD-10-CM | POA: Insufficient documentation

## 2017-10-29 MED ORDER — FLUCONAZOLE 150 MG PO TABS: 150.0000 mg | ORAL_TABLET | Freq: Once | ORAL | 0 refills | Status: AC

## 2017-10-29 MED ORDER — AMOXICILLIN-POT CLAVULANATE 500-125 MG PO TABS: 1.0000 | ORAL_TABLET | Freq: Two times a day (BID) | ORAL | 0 refills | Status: DC

## 2017-10-29 MED ORDER — CEPHALEXIN 500 MG PO CAPS: 500.0000 mg | ORAL_CAPSULE | Freq: Three times a day (TID) | ORAL | 0 refills | Status: DC

## 2017-10-29 NOTE — Assessment & Plan Note (Addendum)
Examination unremarkable today. Vitals stable. No symptoms at rest or with exertion. Tolerating Eliquis well. Is completing starter pack currently. Rx given for 5 mg Eliquis BID to continue for at least 3 months. Will continue off of OCPS for now. 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  Will check CBC w diff today and Hemoccult giving darker stools noted. Suspect related to iron use but need to be more cautious giving anticoagulation currently. Strict ER precautions reviewed with patient and husband.

## 2017-10-29 NOTE — Assessment & Plan Note (Signed)
+   mutation increasing risk of PE with OCP use.  Negative Protein C and S testing. Recommended to be repeated in the future after acuity of clot has changed as these levels can be falsely negative with acute clots. Will defer to PCP to decide if they want to follow on this or have her see Hematology.

## 2017-11-08 ENCOUNTER — Encounter: Payer: Self-pay | Admitting: Family Medicine

## 2017-11-08 ENCOUNTER — Ambulatory Visit (INDEPENDENT_AMBULATORY_CARE_PROVIDER_SITE_OTHER): Payer: 59 | Admitting: Family Medicine

## 2017-11-08 VITALS — BP 118/80 | HR 71 | Temp 98.3°F | Resp 20 | Ht 63.0 in | Wt 130.0 lb

## 2017-11-08 DIAGNOSIS — B379 Candidiasis, unspecified: Secondary | ICD-10-CM

## 2017-11-08 DIAGNOSIS — N390 Urinary tract infection, site not specified: Secondary | ICD-10-CM | POA: Insufficient documentation

## 2017-11-08 DIAGNOSIS — R350 Frequency of micturition: Secondary | ICD-10-CM

## 2017-11-08 DIAGNOSIS — R300 Dysuria: Secondary | ICD-10-CM | POA: Diagnosis not present

## 2017-11-08 DIAGNOSIS — Z7901 Long term (current) use of anticoagulants: Secondary | ICD-10-CM | POA: Diagnosis not present

## 2017-11-08 DIAGNOSIS — D6852 Prothrombin gene mutation: Secondary | ICD-10-CM | POA: Diagnosis not present

## 2017-11-08 DIAGNOSIS — I2699 Other pulmonary embolism without acute cor pulmonale: Secondary | ICD-10-CM

## 2017-11-08 LAB — POC URINALSYSI DIPSTICK (AUTOMATED)
BILIRUBIN UA: NEGATIVE
Blood, UA: NEGATIVE
GLUCOSE UA: NEGATIVE
KETONES UA: NEGATIVE
Nitrite, UA: NEGATIVE
Protein, UA: NEGATIVE
SPEC GRAV UA: 1.01 (ref 1.010–1.025)
Urobilinogen, UA: 0.2 E.U./dL
pH, UA: 7.5 (ref 5.0–8.0)

## 2017-11-08 MED ORDER — NITROFURANTOIN MONOHYD MACRO 100 MG PO CAPS: 100.0000 mg | ORAL_CAPSULE | Freq: Every day | ORAL | 1 refills | Status: DC

## 2017-11-08 MED ORDER — FLUCONAZOLE 150 MG PO TABS: ORAL_TABLET | ORAL | 0 refills | Status: DC

## 2017-11-08 MED ORDER — CEPHALEXIN 500 MG PO CAPS: 500.0000 mg | ORAL_CAPSULE | Freq: Four times a day (QID) | ORAL | 0 refills | Status: DC

## 2017-11-08 NOTE — Patient Instructions (Signed)
Keflex every 6 hours for 7 days then start macrobid 1 pill before bed --> if symptoms return on this regimen, will need to consider urology referral   Diflucan prescribed take every other for 3 dose (last dose should be after keflex)  I will refer to hematology for further recs and studies on blood clots.    Please help [place] help you:  We are honored you have chosen [facility] for your Primary Care home. Below you will find basic instructions that you may need to access in the future. Please help [place] help you by reading the instructions, which cover many of the frequent questions we experience.   Prescription refills and request:  -In order to allow more efficient response time, please call your pharmacy for all refills. They will forward the request electronically to [place]. This allows for the quickest possible response. Request left on a nurse line can take longer to refill, since these are checked as time allows between office patients and other phone calls.  - refill request can take up to 3-5 working days to complete.  - If request is sent electronically and request is appropiate, it is usually completed in 1-2 business days.  - all patients will need to be seen routinely for all chronic medical conditions requiring prescription medications (see follow-up below). If you are overdue for follow up on your condition, you will be asked to make an appointment and we will call in enough medication to cover you until your appointment (up to 30 days).  - all controlled substances will require a face to face visit to request/refill.  - if you desire your prescriptions to go through a new pharmacy, and have an active script at original pharmacy, you will need to call your pharmacy and have scripts transferred to new pharmacy. This is completed between the pharmacy locations and not by your provider.    Results: If any images or labs were ordered, it can take up to 1 week to get results depending on  the test ordered and the lab/facility running and resulting the test. - Normal or stable results, which do not need further discussion, may be released to your mychart immediately with attached note to you. A call may not be generated for normal results. Please make certain to sign up for mychart. If you have questions on how to activate your mychart you can call the front office.  - If your results need further discussion, our office will attempt to contact you via phone, and if unable to reach you after 2 attempts, we will release your abnormal result to your mychart with instructions.  - All results will be automatically released in mychart after 1 week.  - Your provider will provide you with explanation and instruction on all relevant material in your results. Please keep in mind, results and labs may appear confusing or abnormal to the untrained eye, but it does not mean they are actually abnormal for you personally. If you have any questions about your results that are not covered, or you desire more detailed explanation than what was provided, you should make an appointment with your provider to do so.   Our office handles many outgoing and incoming calls daily. If we have not contacted you within 1 week about your results, please check your mychart to see if there is a message first and if not, then contact our office.  In helping with this matter, you help decrease call volume, and therefore allow  [place] to be able to respond to patients needs more efficiently.   Acute office visits (sick visit):  An acute visit is intended for a new problem and are scheduled in shorter time slots to allow schedule openings for patients with new problems. This is the appropriate visit to discuss a new problem. Problems will not be addressed by phone call or Echart message. Appointment is needed if requesting treatment. In order to provide you with excellent quality medical care with proper time for you to explain your  problem, have an exam and receive treatment with instructions, these appointments should be limited to one new problem per visit. If you experience a new problem, in which you desire to be addressed, please make an acute office visit, we save openings on the schedule to accommodate you. Please do not save your new problem for any other type of visit, let us take care of it properly and quickly for you.   Follow up visits:  Depending on your condition(s) your provider will need to see you routinely in order to provide you with quality care and prescribe medication(s). Most chronic conditions (Example: hypertension, Diabetes, depression/anxiety... etc), require visits a couple times a year. Your provider will instruct you on proper follow up for your personal medical conditions and history. Please make certain to make follow up appointments for your condition as instructed. Failing to do so could result in lapse in your medication treatment/refills. If you request a refill, and are overdue to be seen on a condition, we will always provide you with a 30 day script (once) to allow you time to schedule.    Medicare wellness (well visit): - we have a wonderful Nurse Maudie Mercury), that will meet with you and provide you will yearly medicare wellness visits. These visits should occur yearly (can not be scheduled less than 1 calendar year apart) and cover preventive health, immunizations, advance directives and screenings you are entitled to yearly through your medicare benefits. Do not miss out on your entitled benefits, this is when medicare will pay for these benefits to be ordered for you.  These are strongly encouraged by your provider and is the appropriate type of visit to make certain you are up to date with all preventive health benefits. If you have not had your medicare wellness exam in the last 12 months, please make certain to schedule one by calling the office and schedule your medicare wellness with Maudie Mercury as  soon as possible.   Yearly physical (well visit):  - Adults are recommended to be seen yearly for physicals. Check with your insurance and date of your last physical, most insurances require one calendar year between physicals. Physicals include all preventive health topics, screenings, medical exam and labs that are appropriate for gender/age and history. You may have fasting labs needed at this visit. This is a well visit (not a sick visit), new problems should not be covered during this visit (see acute visit).  - Pediatric patients are seen more frequently when they are younger. Your provider will advise you on well child visit timing that is appropriate for your their age. - This is not a medicare wellness visit. Medicare wellness exams do not have an exam portion to the visit. Some medicare companies allow for a physical, some do not allow a yearly physical. If your medicare allows a yearly physical you can schedule the medicare wellness with our nurse Maudie Mercury and have your physical with your provider after, on the same day. Please  check with insurance for your full benefits.   Late Policy/No Shows:  - all new patients should arrive 15-30 minutes earlier than appointment to allow [place] time  to  obtain all personal demographics,  insurance information and for you to complete office paperwork. - All established patients should arrive 10-15 minutes earlier than appointment time to update all information and be checked in .  - In our best efforts to run on time, if you are late for your appointment you will be asked to either reschedule or if able, we will work you back into the schedule. There will be a wait time to work you back in the schedule,  depending on availability.  - If you are unable to make it to your appointment as scheduled, please call 24 hours ahead of time to allow [place] to fill the time slot with someone else who needs to be seen. If you do not cancel your appointment ahead of time, you may be  charged a no show fee.

## 2017-11-08 NOTE — Progress Notes (Signed)
[name] [name] , February 10, 1976, 42 y.o., female MRN: [id number] Patient Care Team    Relationship Specialty Notifications Start End  Natalia Leatherwood, DO PCP - General Family Medicine  10/16/17     Chief Complaint  Patient presents with  . Urinary Frequency    pain with urination x 1 day     Subjective: Pt presents for an OV with couple complaints.   Recurrent UTI: She presents today with symptoms of recurrent UTI.  Was recently treated for E. coli UTI 10/16/2017, with Keflex in which it was sensitive.  She reports after completion of antibiotics she noticed return of symptoms 1 to 2 days later.  She was seen by another provider and was given Augmentin.  Patient states the UTI symptoms again returned 2 days ago.  She reports a history of frequent UTIs in which she had been on daily prophylactic medicine at one time.  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  She denies fever, chills, nausea or vomit.  Denies lower back discomfort.  Pulmonary embolism/positive prothrombin gene mutation: Appointment 10/16/2017 she also presented with shortness of breath.  Positive d-dimer was found and patient sent to the emergency room in which she was found to have bilateral PEs with right heart strain.  She is doing well on Eliquis.  DVT is felt to be secondary to birth control pills.  Patient was found to have a positive prothrombin gene mutation as well, in the setting of an acute PE.  Infection-yeast/labia discomfort: He reports she having a yeast infection after the last round of antibiotics.  She did have a Diflucan on hand and was able to take it.  She has noticed since that time she is getting intermittent painful "bumps "on her labia that cannot be seen but felt.  They resolve on their own within a few days.  She denies fevers, chills, nausea.  She is in a monogamous relationship with her husband.  No history of HSV or prior vaginal lesions. Depression screen [facility] 2/9  10/16/2017  Decreased Interest 0  Down, Depressed, Hopeless 0  PHQ - 2 Score 0  Altered sleeping 1  Tired, decreased energy 0  Change in appetite 0  Feeling bad or failure about yourself  0  Moving slowly or fidgety/restless 0  Suicidal thoughts 0  PHQ-9 Score 1    Allergies  Allergen Reactions  . Sulfa Antibiotics Anaphylaxis and Hives  . Erythromycin Other (See Comments)    unknown As a child, unknown reaction  . Morphine Other (See Comments)    aggressiveness  . Morphine And Related Itching and Other (See Comments)    hallucinations   Social History   Tobacco Use  . Smoking status: Former Games developer  . Smokeless tobacco: Never Used  Substance Use Topics  . Alcohol use: Yes   Past Medical History:  Diagnosis Date  . AMA (advanced maternal age) multigravida 35+   . Arrhythmia    Mobitz type 1  . Chicken pox   . Depression   . Depression with anxiety   . Frequent headaches   . Heart murmur    as a child  . Tachycardia    as a child  . Thyroid disease   . Urinary incontinence    Past Surgical History:  Procedure Laterality Date  . ABDOMINOPLASTY    . BREAST ENHANCEMENT SURGERY    . CESAREAN SECTION    . CESAREAN SECTION Bilateral 11/01/2016  Procedure: CESAREAN SECTION;  Surgeon: Levi Aland, MD;  Location: [facility];  Service: Obstetrics;  Laterality: Bilateral;  . KIDNEY SURGERY     congenital abnormality  of ureters  . MANDIBLE SURGERY    . TONSILLECTOMY     Family History  Problem Relation Age of Onset  . Diabetes Mother   . Arthritis Mother   . Depression Mother   . Stroke Mother   . Miscarriages / [place] Mother   . Mental illness Mother   . Hyperlipidemia Father   . Hypertension Father   . Alcohol abuse Father   . Arthritis Father   . Depression Father   . Drug abuse Father   . Hearing loss Father   . Cancer Paternal Aunt   . Cancer Paternal Grandmother   . Ovarian cancer Paternal Grandmother   . Breast cancer Paternal  Grandmother   . Mental illness Sister   . Asthma Brother   . Depression Brother   . Asthma Son   . Depression Maternal Grandmother   . Diabetes Maternal Grandmother   . Heart disease Maternal Grandfather   . Kidney disease Maternal Grandfather   . Stroke Maternal Grandfather   . Alcohol abuse Paternal Grandfather   . Asthma Paternal Grandfather   . Depression Paternal Grandfather   . Drug abuse Paternal Grandfather   . Hearing loss Paternal Grandfather   . Heart disease Paternal Grandfather   . Hyperlipidemia Paternal Grandfather   . Mental illness Paternal Grandfather    Allergies as of 11/08/2017      Reactions   Sulfa Antibiotics Anaphylaxis, Hives   Erythromycin Other (See Comments)   unknown As a child, unknown reaction   Morphine Other (See Comments)   aggressiveness   Morphine And Related Itching, Other (See Comments)   hallucinations      Medication List        Accurate as of 11/08/17  9:54 AM. Always use your most recent med list.          apixaban 5 MG Tabs tablet Commonly known as:  ELIQUIS Take 1 tablet (5 mg total) by mouth 2 (two) times daily.   prenatal multivitamin Tabs tablet Take 1 tablet by mouth daily at 12 noon.       All past medical history, surgical history, allergies, family history, immunizations andmedications were updated in the EMR today and reviewed under the history and medication portions of their EMR.     ROS: Negative, with the exception of above mentioned in HPI   Objective:  BP 118/80 (BP Location: Left Arm, Patient Position: Sitting, Cuff Size: Normal)   Pulse 71   Temp 98.3 F (36.8 C)   Resp 20   Ht 5\' 3"  (1.6 m)   Wt 130 lb (59 kg)   SpO2 99%   BMI 23.03 kg/m  Body mass index is 23.03 kg/m. Gen: Afebrile. No acute distress. Nontoxic in appearance, well developed, well nourished.  Very pleasant Caucasian female. HENT: AT. [place]. MMM, no oral lesions.  Eyes:Pupils Equal Round Reactive to light, Extraocular movements  intact,  Conjunctiva without redness, discharge or icterus. CV: RRR no murmur, no edema Chest: CTAB, no wheeze or crackles. Good air movement, normal resp effort.  Abd: Soft.  Flat, suprapubic tenderness present, ND. BS present.  No masses palpated. No rebound or guarding.  Neuro:  Normal gait. PERLA. EOMi. Alert. Oriented x3   No exam data present No results found. Results for orders placed or performed in visit  on 11/08/17 (from the past 24 hour(s))  POCT Urinalysis Dipstick (Automated)     Status: Abnormal   Collection Time: 11/08/17  9:49 AM  Result Value Ref Range   Color, UA light yellow    Clarity, UA clear    Glucose, UA Negative Negative   Bilirubin, UA negative    Ketones, UA negative    Spec Grav, UA 1.010 1.010 - 1.025   Blood, UA negative    pH, UA 7.5 5.0 - 8.0   Protein, UA Negative Negative   Urobilinogen, UA 0.2 0.2 or 1.0 E.U./dL   Nitrite, UA negative    Leukocytes, UA Trace (A) Negative    Assessment/Plan: [name] [name] is a 42 y.o. female present for OV for  Urinary frequency/dysuria/frequent UTI Urine does not appear infectious today, however it is early within her symptoms.  Discussed treatment with Keflex 4 times daily for 7 days and then start on Macrobid nightly prophylactically. - POCT Urinalysis Dipstick (Automated) - Urine Culture -Follow-up in 3 months, sooner if repeat symptoms and then would have low threshold to refer to urology given her prior history with a congenital abnormality and surgery.  Bilateral pulmonary embolism (HCC)/Prothrombin gene mutation (HCC) Tolerating Eliquis.  Need to remain on medication least until January 17, 2018, possibly longer.  He had start of birth control pills just a few weeks prior to onset of her PE symptoms which could have been the cause, versus a prothrombin gene mutation that is a possibility given her positive test.  Although it was collected during an active PE which could be a false positive.  Options were  discussed with her today and she is agreeable to hematology referral, will try to schedule around the 74-month mark so that testing can be completed if felt necessary and decision can be made on anticoagulation timeline by hematology.  Patient would like referral to hematology.  Yeast infection: -Diflucan every other day for 3 doses, last dose to be after Keflex use.  Vaginal complaints remain after treatment, encouraged her to follow-up with her gynecologist.  She Agrees with plan.  Reviewed expectations re: course of current medical issues.  Discussed self-management of symptoms.  Outlined signs and symptoms indicating need for more acute intervention.  Patient verbalized understanding and all questions were answered.  Patient received an After-Visit Summary.    Orders Placed This Encounter  Procedures  . POCT Urinalysis Dipstick (Automated)     Note is dictated utilizing voice recognition software. Although note has been proof read prior to signing, occasional typographical errors still can be missed. If any questions arise, please do not hesitate to call for verification.   electronically signed by:  Felix Pacini, DO  [facility] - [place]

## 2017-11-10 LAB — URINE CULTURE
MICRO NUMBER: 90854129
SPECIMEN QUALITY: ADEQUATE

## 2018-01-10 ENCOUNTER — Other Ambulatory Visit: Payer: Self-pay | Admitting: Physician Assistant

## 2018-01-17 ENCOUNTER — Inpatient Hospital Stay: Payer: 59

## 2018-01-17 ENCOUNTER — Inpatient Hospital Stay: Payer: 59 | Attending: Family | Admitting: Hematology & Oncology

## 2018-01-17 ENCOUNTER — Encounter: Payer: Self-pay | Admitting: Hematology & Oncology

## 2018-01-17 ENCOUNTER — Other Ambulatory Visit: Payer: Self-pay

## 2018-01-17 VITALS — BP 110/67 | HR 88 | Temp 98.1°F | Resp 16 | Ht 63.0 in | Wt 128.0 lb

## 2018-01-17 DIAGNOSIS — Z7901 Long term (current) use of anticoagulants: Secondary | ICD-10-CM | POA: Insufficient documentation

## 2018-01-17 DIAGNOSIS — I2699 Other pulmonary embolism without acute cor pulmonale: Secondary | ICD-10-CM

## 2018-01-17 DIAGNOSIS — R0789 Other chest pain: Secondary | ICD-10-CM | POA: Diagnosis not present

## 2018-01-17 DIAGNOSIS — Z86711 Personal history of pulmonary embolism: Secondary | ICD-10-CM | POA: Insufficient documentation

## 2018-01-17 DIAGNOSIS — Z809 Family history of malignant neoplasm, unspecified: Secondary | ICD-10-CM

## 2018-01-17 DIAGNOSIS — D6852 Prothrombin gene mutation: Secondary | ICD-10-CM | POA: Insufficient documentation

## 2018-01-17 DIAGNOSIS — Z8041 Family history of malignant neoplasm of ovary: Secondary | ICD-10-CM | POA: Diagnosis not present

## 2018-01-17 DIAGNOSIS — Z87891 Personal history of nicotine dependence: Secondary | ICD-10-CM | POA: Insufficient documentation

## 2018-01-17 DIAGNOSIS — Z803 Family history of malignant neoplasm of breast: Secondary | ICD-10-CM | POA: Diagnosis not present

## 2018-01-17 DIAGNOSIS — Z808 Family history of malignant neoplasm of other organs or systems: Secondary | ICD-10-CM

## 2018-01-17 LAB — CMP (CANCER CENTER ONLY)
ALK PHOS: 66 U/L (ref 26–84)
ALT: 34 U/L (ref 10–47)
AST: 38 U/L (ref 11–38)
Albumin: 4.2 g/dL (ref 3.5–5.0)
Anion gap: 4 — ABNORMAL LOW (ref 5–15)
BUN: 9 mg/dL (ref 7–22)
CHLORIDE: 110 mmol/L — AB (ref 98–108)
CO2: 26 mmol/L (ref 18–33)
Calcium: 9.7 mg/dL (ref 8.0–10.3)
Creatinine: 0.9 mg/dL (ref 0.60–1.20)
Glucose, Bld: 92 mg/dL (ref 73–118)
Potassium: 4 mmol/L (ref 3.3–4.7)
Sodium: 140 mmol/L (ref 128–145)
Total Bilirubin: 0.7 mg/dL (ref 0.2–1.6)
Total Protein: 7.7 g/dL (ref 6.4–8.1)

## 2018-01-17 LAB — CBC WITH DIFFERENTIAL (CANCER CENTER ONLY)
Basophils Absolute: 0 10*3/uL (ref 0.0–0.1)
Basophils Relative: 0 %
EOS ABS: 0 10*3/uL (ref 0.0–0.5)
Eosinophils Relative: 0 %
HCT: 41 % (ref 34.8–46.6)
HEMOGLOBIN: 13.5 g/dL (ref 11.6–15.9)
Lymphocytes Relative: 31 %
Lymphs Abs: 1.6 10*3/uL (ref 0.9–3.3)
MCH: 32.7 pg (ref 26.0–34.0)
MCHC: 32.9 g/dL (ref 32.0–36.0)
MCV: 99.3 fL (ref 81.0–101.0)
MONOS PCT: 5 %
Monocytes Absolute: 0.3 10*3/uL (ref 0.1–0.9)
NEUTROS PCT: 64 %
Neutro Abs: 3.3 10*3/uL (ref 1.5–6.5)
Platelet Count: 273 10*3/uL (ref 145–400)
RBC: 4.13 MIL/uL (ref 3.70–5.32)
RDW: 12.5 % (ref 11.1–15.7)
WBC: 5.2 10*3/uL (ref 3.9–10.0)

## 2018-01-17 NOTE — Progress Notes (Signed)
Referral MD  Reason for Referral: Bilateral pulmonary emboli while on oral contraceptive-heterozygous Prothrombin II gene mutation  Chief Complaint  Patient presents with  . New Patient (Initial Visit)  : I had blood clots in my lung.  HPI: [name] [name] is a very nice 42 year old white female.  She is originally from [place].  She comes in with her husband and a 72-monthold baby girl.  I must say that she has a very interesting story as to how she and her husband met.  [place]  They had children separately.  They have been married.  They have a total of 6 kids.  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  She has had 3 miscarriages.  Apparently, her maternal grandfather had a lot of blood clots and was on heparin.  Her mother has had ovarian cancer.  There is a history of uterine and breast cancer in the family.  She began to have some chest pain shortness of breath back in early June.  She is had a lot of pain is on the right side of her chest.  It went from her waist up to her neck.  She thought that this was just some allergy issues.  She did not have any hemoptysis.  Her symptoms waxed and waned.  However, they began to get worse.  She began to have more difficulty breathing.  She is a runner and typically runs a couple miles a day.  She went to the emergency room.  This was back in late June.  She had a positive d-dimer.  She had a CT angiogram which showed bilateral pulmonary emboli that was considered to be submassive.  She was admitted.  She had a Doppler of her legs which was negative for any thrombotic disease..  She had an echocardiogram which showed an ejection fraction of 55-60%.  There was no right ventricular strain.  She has been on oral contraceptives for about 10 years.  She was taken off these.  She had a hypercoagulable panel done.  She was found to be heterozygous for the Prothrombin II gene mutation.  Of note, she has 2  girls.  One is 40years old was 40years old.  They are both on oral contraceptives.  These girls need to be checked.  Again she does not smoke.  She is feeling better.  She still does not have as much stamina.  She still has some chest discomfort when she exercises.  There is no problems with bowels or bladder.  She has had no fever.  There is no headache.  She was subsequently discharged on Eliquis.  [facility].  Overall, her performance status is ECOG 0.   Past Medical History:  Diagnosis Date  . AMA (advanced maternal age) multigravida 311+  . Arrhythmia    Mobitz type 1  . Chicken pox   . Depression   . Depression with anxiety   . Frequent headaches   . Heart murmur    as a child  . Tachycardia    as a child  . Thyroid disease   . Urinary incontinence   :  Past Surgical History:  Procedure Laterality Date  . ABDOMINOPLASTY    . BREAST ENHANCEMENT SURGERY    . CESAREAN SECTION    . CESAREAN SECTION Bilateral 11/01/2016   Procedure: CESAREAN SECTION;  Surgeon: AOlga Millers  MD;  Location: [facility];  Service: Obstetrics;  Laterality: Bilateral;  . KIDNEY SURGERY     congenital abnormality  of ureters  . MANDIBLE SURGERY    . TONSILLECTOMY    :   Current Outpatient Medications:  .  ELIQUIS 5 MG TABS tablet, TAKE 1 TABLET BY MOUTH TWICE A DAY, Disp: 60 tablet, Rfl: 0 .  nitrofurantoin, macrocrystal-monohydrate, (MACROBID) 100 MG capsule, Take 1 capsule (100 mg total) by mouth at bedtime., Disp: 90 capsule, Rfl: 1:  :  Allergies  Allergen Reactions  . Sulfa Antibiotics Anaphylaxis and Hives  . Erythromycin Other (See Comments)    unknown As a child, unknown reaction  . Morphine Other (See Comments)    aggressiveness  . Morphine And Related Itching and Other (See Comments)    hallucinations  :  Family History  Problem Relation Age of Onset  . Diabetes Mother   .  Arthritis Mother   . Depression Mother   . Stroke Mother   . Miscarriages / [place] Mother   . Mental illness Mother   . Hyperlipidemia Father   . Hypertension Father   . Alcohol abuse Father   . Arthritis Father   . Depression Father   . Drug abuse Father   . Hearing loss Father   . Cancer Paternal Aunt   . Cancer Paternal Grandmother   . Ovarian cancer Paternal Grandmother   . Breast cancer Paternal Grandmother   . Mental illness Sister   . Asthma Brother   . Depression Brother   . Asthma Son   . Depression Maternal Grandmother   . Diabetes Maternal Grandmother   . Heart disease Maternal Grandfather   . Kidney disease Maternal Grandfather   . Stroke Maternal Grandfather   . Alcohol abuse Paternal Grandfather   . Asthma Paternal Grandfather   . Depression Paternal Grandfather   . Drug abuse Paternal Grandfather   . Hearing loss Paternal Grandfather   . Heart disease Paternal Grandfather   . Hyperlipidemia Paternal Grandfather   . Mental illness Paternal Grandfather   :  Social History   Socioeconomic History  . Marital status: Married    Spouse name: Not on file  . Number of children: Not on file  . Years of education: Not on file  . Highest education level: Not on file  Occupational History  . Not on file  Social Needs  . Financial resource strain: Not on file  . Food insecurity:    Worry: Not on file    Inability: Not on file  . Transportation needs:    Medical: Not on file    Non-medical: Not on file  Tobacco Use  . Smoking status: Former Research scientist (life sciences)  . Smokeless tobacco: Never Used  Substance and Sexual Activity  . Alcohol use: Yes  . Drug use: No  . Sexual activity: Yes    Partners: Male    Birth control/protection: None  Lifestyle  . Physical activity:    Days per week: Not on file    Minutes per session: Not on file  . Stress: Not on file  Relationships  . Social connections:    Talks on phone: Not on file    Gets together: Not on file     Attends religious service: Not on file    Active member of club or organization: Not on file    Attends meetings of clubs or organizations: Not on file    Relationship status: Not on file  . Intimate  partner violence:    Fear of current or ex partner: Not on file    Emotionally abused: Not on file    Physically abused: Not on file    Forced sexual activity: Not on file  Other Topics Concern  . Not on file  Social History Narrative   Married, college-educated.  Works in Press photographer.  4 children.   Social drinker.  Former smoker.   Exercises 3 times a week.   Smoke alarm in the home.  Wears her seatbelt.   Feels safe in her relationships.  :  Review of Systems  Constitutional: Negative.   HENT: Negative.   Eyes: Negative.   Respiratory: Positive for shortness of breath.   Cardiovascular: Positive for chest pain.  Gastrointestinal: Negative.   Genitourinary: Negative.   Musculoskeletal: Negative.   Skin: Negative.   Neurological: Negative.   Endo/Heme/Allergies: Negative.   Psychiatric/Behavioral: Negative.      Exam: Well-developed well-nourished white female in no obvious distress.  Vital signs show a temperature of 98.1.  Pulse 88.  Blood pressure 110/67.  Weight is 128 pounds.  Head neck exam shows no ocular or oral lesions.  There are no palpable cervical or supraclavicular lymph nodes.  Lungs are clear bilaterally.  I hear no friction rubs or rales.  Cardiac exam regular rate and rhythm with no murmurs, rubs or bruits.  Abdomen is soft.  She has good bowel sounds.  There is no fluid wave.  There is no palpable liver or spleen tip.  Back exam shows no tenderness over the spine, ribs or hips.  Extremities shows no clubbing, cyanosis or edema.  She has no palpable venous cord.  There is a negative Homans sign bilaterally.  Neurological exam shows no focal neurological deficits.  Skin exam shows no rashes, ecchymoses or petechia. '@IPVITALS'$ @   Recent Labs    01/17/18 1405  WBC 5.2   HGB 13.5  HCT 41.0  PLT 273   Recent Labs    01/17/18 1405  NA 140  K 4.0  CL 110*  CO2 26  GLUCOSE 92  BUN 9  CREATININE 0.90  CALCIUM 9.7    Blood smear review: None  Pathology: None    Assessment and Plan: [name] [name] is a very charming 42 year old white female.  She has the Prothrombin II gene mutation.  She has had 3 miscarriages.  She does not plan on having any other children.  She is on Eliquis.  She will need to be on therapeutic Eliquis for 1 year.  I will then put her on maintenance Eliquis for another year.  If I do not think that she needs lifelong anticoagulation despite having the hypercoagulable state.  Clearly, her daughters need to be checked.  She will make sure that this happens.  I would like to get a CT angiogram on her in the next few weeks so that we can see how the blood clots have responded.  I told her that it may take about a year before she starts feeling better.  Her lungs probably have been "bruised" and this will take a while to heal up.  I spent a good 45 minutes with she and her husband.  They are both very very nice.  All the time was spent face-to-face with them.  I counseled them.  I help coordinate future care.  I answered all their questions.  I do think that she probably needs to be tested genetically for the breast cancer/ovarian cancer gene given the  family history.  We will see about making a referral to a geneticist.

## 2018-01-28 ENCOUNTER — Ambulatory Visit (HOSPITAL_BASED_OUTPATIENT_CLINIC_OR_DEPARTMENT_OTHER)
Admission: RE | Admit: 2018-01-28 | Discharge: 2018-01-28 | Disposition: A | Discharge status: Home / Self Care | Payer: 59 | Source: Ambulatory Visit | Attending: Hematology & Oncology | Admitting: Hematology & Oncology

## 2018-01-28 ENCOUNTER — Encounter (HOSPITAL_BASED_OUTPATIENT_CLINIC_OR_DEPARTMENT_OTHER): Payer: Self-pay

## 2018-01-28 DIAGNOSIS — I2699 Other pulmonary embolism without acute cor pulmonale: Secondary | ICD-10-CM | POA: Insufficient documentation

## 2018-01-28 MED ORDER — IOPAMIDOL (ISOVUE-370) INJECTION 76%
100.0000 mL | Freq: Once | INTRAVENOUS | Status: AC | PRN
  Administered 2018-01-28: 100 mL via INTRAVENOUS

## 2018-01-29 ENCOUNTER — Telehealth: Payer: Self-pay | Admitting: *Deleted

## 2018-01-29 NOTE — Telephone Encounter (Addendum)
Patient is aware of results  ----- Message from Josph Macho, MD sent at 01/28/2018  3:18 PM EDT ----- Call - NO pulmonary embolism in the lung!!  Cindee Lame

## 2018-02-25 ENCOUNTER — Ambulatory Visit: Payer: 59 | Admitting: Hematology & Oncology

## 2018-02-25 ENCOUNTER — Other Ambulatory Visit: Payer: 59

## 2018-02-26 ENCOUNTER — Other Ambulatory Visit: Payer: Self-pay | Admitting: *Deleted

## 2018-02-26 DIAGNOSIS — I2699 Other pulmonary embolism without acute cor pulmonale: Secondary | ICD-10-CM

## 2018-02-26 MED ORDER — APIXABAN 5 MG PO TABS: 5.0000 mg | ORAL_TABLET | Freq: Two times a day (BID) | ORAL | 6 refills | Status: DC

## 2018-03-15 ENCOUNTER — Inpatient Hospital Stay (HOSPITAL_BASED_OUTPATIENT_CLINIC_OR_DEPARTMENT_OTHER): Payer: 59 | Admitting: Hematology & Oncology

## 2018-03-15 ENCOUNTER — Other Ambulatory Visit: Payer: Self-pay

## 2018-03-15 ENCOUNTER — Inpatient Hospital Stay: Payer: 59 | Attending: Family

## 2018-03-15 ENCOUNTER — Encounter: Payer: Self-pay | Admitting: Hematology & Oncology

## 2018-03-15 VITALS — BP 105/70 | HR 72 | Temp 98.2°F | Resp 18 | Wt 133.0 lb

## 2018-03-15 DIAGNOSIS — I2699 Other pulmonary embolism without acute cor pulmonale: Secondary | ICD-10-CM | POA: Insufficient documentation

## 2018-03-15 DIAGNOSIS — Z86711 Personal history of pulmonary embolism: Secondary | ICD-10-CM | POA: Diagnosis present

## 2018-03-15 DIAGNOSIS — Z7901 Long term (current) use of anticoagulants: Secondary | ICD-10-CM

## 2018-03-15 DIAGNOSIS — D6852 Prothrombin gene mutation: Secondary | ICD-10-CM

## 2018-03-15 LAB — CMP (CANCER CENTER ONLY)
ALT: 20 U/L (ref 10–47)
AST: 25 U/L (ref 11–38)
Albumin: 4.1 g/dL (ref 3.5–5.0)
Alkaline Phosphatase: 61 U/L (ref 26–84)
Anion gap: 7 (ref 5–15)
BILIRUBIN TOTAL: 0.7 mg/dL (ref 0.2–1.6)
BUN: 14 mg/dL (ref 7–22)
CALCIUM: 9.6 mg/dL (ref 8.0–10.3)
CO2: 27 mmol/L (ref 18–33)
Chloride: 107 mmol/L (ref 98–108)
Creatinine: 0.8 mg/dL (ref 0.60–1.20)
GLUCOSE: 89 mg/dL (ref 73–118)
POTASSIUM: 3.8 mmol/L (ref 3.3–4.7)
Sodium: 141 mmol/L (ref 128–145)
TOTAL PROTEIN: 7.6 g/dL (ref 6.4–8.1)

## 2018-03-15 LAB — CBC WITH DIFFERENTIAL (CANCER CENTER ONLY)
ABS IMMATURE GRANULOCYTES: 0.01 10*3/uL (ref 0.00–0.07)
BASOS PCT: 0 %
Basophils Absolute: 0 10*3/uL (ref 0.0–0.1)
EOS ABS: 0.1 10*3/uL (ref 0.0–0.5)
EOS PCT: 1 %
HCT: 41.8 % (ref 36.0–46.0)
Hemoglobin: 13.4 g/dL (ref 12.0–15.0)
Immature Granulocytes: 0 %
Lymphocytes Relative: 31 %
Lymphs Abs: 1.9 10*3/uL (ref 0.7–4.0)
MCH: 32.3 pg (ref 26.0–34.0)
MCHC: 32.1 g/dL (ref 30.0–36.0)
MCV: 100.7 fL — AB (ref 80.0–100.0)
MONO ABS: 0.3 10*3/uL (ref 0.1–1.0)
MONOS PCT: 5 %
Neutro Abs: 4 10*3/uL (ref 1.7–7.7)
Neutrophils Relative %: 63 %
PLATELETS: 290 10*3/uL (ref 150–400)
RBC: 4.15 MIL/uL (ref 3.87–5.11)
RDW: 11.8 % (ref 11.5–15.5)
WBC: 6.3 10*3/uL (ref 4.0–10.5)
nRBC: 0 % (ref 0.0–0.2)

## 2018-03-15 LAB — D-DIMER, QUANTITATIVE (NOT AT ARMC)

## 2018-03-15 MED ORDER — APIXABAN 5 MG PO TABS: 5.0000 mg | ORAL_TABLET | Freq: Two times a day (BID) | ORAL | 2 refills | Status: DC

## 2018-03-15 NOTE — Progress Notes (Signed)
Hematology and Oncology Follow Up Visit  [name] [name] [id number] [birth year] 42 y.o. 03/15/2018   Principle Diagnosis:   Heterozygous Prothrombin II gene mutation  Bilateral pulmonary emboli-on oral contraceptives  Multiple miscarriages  Current Therapy:    Eliquis 5 mg p.o. twice daily-to complete 1 year of therapeutic dosing in July 2020     Interim History:  [name] [name] is back for second office visit.  We first saw her on 01/17/2018.  At that time, [name] [name] presented with the pulmonary emboli.  [name] [name] had this back in June.  [name] [name] was placed on anticoagulation at that time.  [name] [name] was found to have the heterozygous Prothrombin II gene mutation.  We did repeat a CT angiogram.  This was done in October 2019.  The CT angiogram of the chest showed resolution of the pulmonary emboli.  [name] [name] is doing well.  [name] [name] is still working full-time.  Her husband comes with her today.  They both have fairly stressful jobs.  [name] [name] has had no bleeding.  [name] [name] has had no cough or shortness of breath.  [name] [name] is still working out quite a bit.  [name] [name] likes to exercise.  [name] [name] likes to run.  We did do a d-dimer on her today.  Thankfully, this was less than 0.27.  [name] [name] has had no change in bowel or bladder habits.  [name] [name] is thinking about having a hysterectomy and her ovaries removed.  I do not see any problems with her having this procedure.  [name] [name] can have it before the end of the year.  Given the fact that her CT angiogram of the chest shows resolution of her pulmonary emboli, I think that the risk of her developing recurrent emboli are very low.  If [name] [name] is to have surgery prior to the end of the year, I told her that [name] [name] just needs to stop the Eliquis 2 days before surgery and [name] [name] can restart the day after surgery.  Given that [name] [name] is in great shape, I would not think that [name] [name] would be bedbound.  [name] [name] has had no leg swelling.  [name] [name] has had no headache.  Overall, her performance status is ECOG 0.  Medications:  Current  Outpatient Medications:  .  apixaban (ELIQUIS) 5 MG TABS tablet, Take 1 tablet (5 mg total) by mouth 2 (two) times daily., Disp: 180 tablet, Rfl: 2 .  nitrofurantoin, macrocrystal-monohydrate, (MACROBID) 100 MG capsule, Take 1 capsule (100 mg total) by mouth at bedtime., Disp: 90 capsule, Rfl: 1 .  norethindrone (MICRONOR,CAMILA,ERRIN) 0.35 MG tablet, Take 1 tablet by mouth daily., Disp: , Rfl:   Allergies:  Allergies  Allergen Reactions  . Sulfa Antibiotics Anaphylaxis and Hives  . Erythromycin Other (See Comments)    unknown As a child, unknown reaction  . Morphine Other (See Comments)    aggressiveness  . Morphine And Related Itching and Other (See Comments)    hallucinations    Past Medical History, Surgical history, Social history, and Family History were reviewed and updated.  Review of Systems: Review of Systems  Constitutional: Negative.   HENT:  Negative.   Eyes: Negative.   Respiratory: Negative.   Cardiovascular: Negative.   Gastrointestinal: Negative.   Endocrine: Negative.   Genitourinary: Negative.    Musculoskeletal: Negative.   Skin: Negative.   Neurological: Negative.   Hematological: Negative.   Psychiatric/Behavioral: Negative.     Physical Exam:  weight is 133 lb (60.3 kg). Her oral temperature is 98.2 F (36.8 C). Her blood pressure is 105/70 and  her pulse is 72. Her respiration is 18 and oxygen saturation is 100%.   Wt Readings from Last 3 Encounters:  03/15/18 133 lb (60.3 kg)  01/17/18 128 lb (58.1 kg)  11/08/17 130 lb (59 kg)    Physical Exam  Constitutional: [name] [name] is oriented to person, place, and time.  HENT:  Head: Normocephalic and atraumatic.  Mouth/Throat: Oropharynx is clear and moist.  Eyes: Pupils are equal, round, and reactive to light. EOM are normal.  Neck: Normal range of motion.  Cardiovascular: Normal rate, regular rhythm and normal heart sounds.  Pulmonary/Chest: Effort normal and breath sounds normal.  Abdominal: Soft.  Bowel sounds are normal.  Musculoskeletal: Normal range of motion. [name] [name] exhibits no edema, tenderness or deformity.  Lymphadenopathy:    [name] [name] has no cervical adenopathy.  Neurological: [name] [name] is alert and oriented to person, place, and time.  Skin: Skin is warm and dry. No rash noted. No erythema.  Psychiatric: [name] [name] has a normal mood and affect. Her behavior is normal. Judgment and thought content normal.  Vitals reviewed.    Lab Results  Component Value Date   WBC 6.3 03/15/2018   HGB 13.4 03/15/2018   HCT 41.8 03/15/2018   MCV 100.7 (H) 03/15/2018   PLT 290 03/15/2018     Chemistry      Component Value Date/Time   NA 141 03/15/2018 1435   K 3.8 03/15/2018 1435   CL 107 03/15/2018 1435   CO2 27 03/15/2018 1435   BUN 14 03/15/2018 1435   CREATININE 0.80 03/15/2018 1435   CREATININE 0.86 10/16/2017 1454      Component Value Date/Time   CALCIUM 9.6 03/15/2018 1435   ALKPHOS 61 03/15/2018 1435   AST 25 03/15/2018 1435   ALT 20 03/15/2018 1435   BILITOT 0.7 03/15/2018 1435       Impression and Plan: [name] [name] is a 42 year old white female.  [name] [name] has the Prothrombin II gene mutation.  [name] [name] is heterozygous for this.  I think that [name] [name] will need 1 year of full dose anticoagulation.  After that, [name] [name] would then need 1 year of maintenance dose anticoagulation which would be 2.5 mg p.o. twice daily of Eliquis.  I, again, denies any problems with her having surgery.  I think that [name] [name] should have less than 5% risk of having a pulmonary embolism.  I would like to see her back in 6 months.  I think this would be very reasonable.  [name] [name] knows that [name] [name] can always call us sooner if there are any problems.  As always, it is fun talking to [name] [name] and her husband.  They brought their daughter and who is 55 months old.   Josph Macho, MD 11/22/20193:55 PM

## 2018-03-18 ENCOUNTER — Telehealth: Payer: Self-pay | Admitting: Hematology & Oncology

## 2018-03-18 NOTE — Telephone Encounter (Signed)
I spoke with patient regarding appointments scheduled/ letter calendar mailed per 11/22 los

## 2018-03-19 ENCOUNTER — Emergency Department (HOSPITAL_COMMUNITY)
Admission: EM | Admit: 2018-03-19 | Discharge: 2018-03-19 | Disposition: A | Discharge status: Home / Self Care | Payer: 59 | Attending: Emergency Medicine | Admitting: Emergency Medicine

## 2018-03-19 ENCOUNTER — Encounter (HOSPITAL_COMMUNITY): Payer: Self-pay | Admitting: *Deleted

## 2018-03-19 DIAGNOSIS — Z2914 Encounter for prophylactic rabies immune globin: Secondary | ICD-10-CM | POA: Diagnosis not present

## 2018-03-19 DIAGNOSIS — Z87891 Personal history of nicotine dependence: Secondary | ICD-10-CM | POA: Insufficient documentation

## 2018-03-19 DIAGNOSIS — Z203 Contact with and (suspected) exposure to rabies: Secondary | ICD-10-CM | POA: Diagnosis not present

## 2018-03-19 DIAGNOSIS — Z79899 Other long term (current) drug therapy: Secondary | ICD-10-CM | POA: Insufficient documentation

## 2018-03-19 LAB — I-STAT BETA HCG BLOOD, ED (MC, WL, AP ONLY): I-stat hCG, quantitative: <5 m[IU]/mL (ref <5)

## 2018-03-19 MED ORDER — RABIES IMMUNE GLOBULIN 150 UNIT/ML IM INJ
20.0000 [IU]/kg | INJECTION | Freq: Once | INTRAMUSCULAR | Status: AC
  Administered 2018-03-19: 1200 [IU] via INTRAMUSCULAR
  Filled 2018-03-19: qty 8

## 2018-03-19 MED ORDER — RABIES VACCINE, PCEC IM SUSR
1.0000 mL | Freq: Once | INTRAMUSCULAR | Status: AC
  Administered 2018-03-19: 1 mL via INTRAMUSCULAR
  Filled 2018-03-19: qty 1

## 2018-03-19 NOTE — Discharge Instructions (Addendum)
°                                  RABIES VACCINE FOLLOW UP  Patient's Name: [name] [name]                     Original Order Date:03/19/2018  Medical Record Number: [id number]  ED Physician: Arby Barrette, MD Primary Diagnosis: Rabies Exposure       PCP: Natalia Leatherwood, DO  Patient Phone Number: (home) [phone] (home)    (cell)  Telephone Information:  Mobile [phone]    (work) There is no work phone number on file. Species of Animal:     You have been seen in the Emergency Department for a possible rabies exposure. It's very important you return for the additional vaccine doses.  Please call the clinic listed below for hours of operation.   Clinic that will administer your rabies vaccines:   DAY 0:  03/19/2018      DAY 3:  03/22/2018       DAY 7:  03/26/2018     DAY 14:  04/02/2018         The 5th vaccine injection is considered for immune compromised patients only.  DAY 28:  04/16/2018

## 2018-03-19 NOTE — ED Provider Notes (Signed)
[facility] EMERGENCY DEPARTMENT Provider Note   CSN: [id number] Arrival date & time: 03/19/18  0841     History   Chief Complaint Chief Complaint  Patient presents with  . Rabies Injection    HPI Cristyn H Kimbrough is a 42 y.o. female.  42 y.o female with a PMH of Arrythmia presents to the ED s/p rabies exposure x 4 days ago.  She reports that she was outside playing with her dog when a skunk came and bit her dog right in the face.  Reports hitting the skunk with a metal bucket and examining her dog's mouth with her bare hands.  Reports that she animal controlled which refused to come pick up the skunk and did not advise her to be seen.  Notified she needed to be seen yesterday as she was now exposed to rabies and advised to be seen in the ED for rabies vaccine.  Denies any bites from the skunk,fever, chills or other complaints.      Past Medical History:  Diagnosis Date  . AMA (advanced maternal age) multigravida 35+   . Arrhythmia    Mobitz type 1  . Chicken pox   . Depression   . Depression with anxiety   . Frequent headaches   . Heart murmur    as a child  . Tachycardia    as a child  . Thyroid disease   . Urinary incontinence     Patient Active Problem List   Diagnosis Date Noted  . Frequent UTI 11/08/2017  . Prothrombin gene mutation (HCC) 10/29/2017  . Bilateral pulmonary embolism (HCC) 10/17/2017  . Thyroid disease 10/17/2017  . Depression with anxiety 10/17/2017  . Heart murmur 10/17/2017  . Heart palpitations 10/05/2016  . Second degree atrioventricular block, Mobitz (type) I   . IBS 07/19/2007  . 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 ABDOMINOPLASTY    . BREAST ENHANCEMENT SURGERY    . CESAREAN SECTION    . CESAREAN SECTION Bilateral 11/01/2016   Procedure: CESAREAN SECTION;  Surgeon: Levi Aland, MD;  Location: [facility] BIRTHING SUITES;  Service: Obstetrics;   Laterality: Bilateral;  . KIDNEY SURGERY     congenital abnormality  of ureters  . MANDIBLE SURGERY    . TONSILLECTOMY       OB History    Gravida  6   Para  4   Term  3   Preterm  1   AB  2   Living  4     SAB  2   TAB      Ectopic      Multiple  0   Live Births  4            Home Medications    Prior to Admission medications   Medication Sig Start Date End Date Taking? Authorizing Provider  apixaban (ELIQUIS) 5 MG TABS tablet Take 1 tablet (5 mg total) by mouth 2 (two) times daily. 03/15/18   Josph Macho, MD  nitrofurantoin, macrocrystal-monohydrate, (MACROBID) 100 MG capsule Take 1 capsule (100 mg total) by mouth at bedtime. 11/08/17   Kuneff, Renee A, DO  norethindrone (MICRONOR,CAMILA,ERRIN) 0.35 MG tablet Take 1 tablet by mouth daily.    [provider]    Family History Family History  Problem Relation Age of Onset  . Diabetes Mother   . Arthritis Mother   . Depression Mother   .  Stroke Mother   . Miscarriages / [place] Mother   . Mental illness Mother   . Hyperlipidemia Father   . Hypertension Father   . Alcohol abuse Father   . Arthritis Father   . Depression Father   . Drug abuse Father   . Hearing loss Father   . Cancer Paternal Aunt   . Cancer Paternal Grandmother   . Ovarian cancer Paternal Grandmother   . Breast cancer Paternal Grandmother   . Mental illness Sister   . Asthma Brother   . Depression Brother   . Asthma Son   . Depression Maternal Grandmother   . Diabetes Maternal Grandmother   . Heart disease Maternal Grandfather   . Kidney disease Maternal Grandfather   . Stroke Maternal Grandfather   . Alcohol abuse Paternal Grandfather   . Asthma Paternal Grandfather   . Depression Paternal Grandfather   . Drug abuse Paternal Grandfather   . Hearing loss Paternal Grandfather   . Heart disease Paternal Grandfather   . Hyperlipidemia Paternal Grandfather   . Mental illness Paternal Grandfather      Social History Social History   Tobacco Use  . Smoking status: Former Games developer  . Smokeless tobacco: Never Used  Substance Use Topics  . Alcohol use: Yes  . Drug use: No     Allergies   Sulfa antibiotics; Erythromycin; Morphine; and Morphine and related   Review of Systems Review of Systems  Constitutional: Negative for fever.  Cardiovascular: Negative for chest pain.  Skin: Negative for wound.     Physical Exam Updated Vital Signs BP (!) 137/106 (BP Location: Right Arm)   Pulse 83   Temp 98.4 F (36.9 C) (Oral)   Resp 18   Ht 5' 3.5" (1.613 m)   Wt 60.3 kg   SpO2 100%   BMI 23.19 kg/m   Physical Exam  Constitutional: She is oriented to person, place, and time. She appears well-developed and well-nourished. No distress.  HENT:  Head: Normocephalic and atraumatic.  Mouth/Throat: Oropharynx is clear and moist. No oropharyngeal exudate.  Eyes: Pupils are equal, round, and reactive to light.  Neck: Normal range of motion.  Cardiovascular: Regular rhythm and normal heart sounds.  Pulmonary/Chest: Effort normal and breath sounds normal. No respiratory distress.  Abdominal: Soft. Bowel sounds are normal. She exhibits no distension. There is no tenderness.  Musculoskeletal: She exhibits no tenderness or deformity.       Right lower leg: She exhibits no edema.       Left lower leg: She exhibits no edema.  Neurological: She is alert and oriented to person, place, and time.  Skin: Skin is warm and dry. No abrasion and no laceration noted.  Psychiatric: She has a normal mood and affect.  Nursing note and vitals reviewed.    ED Treatments / Results  Labs (all labs ordered are listed, but only abnormal results are displayed) Labs Reviewed  I-STAT BETA HCG BLOOD, ED ([facility], [facility], [facility] ONLY)    EKG None  Radiology No results found.  Procedures Procedures (including critical care time)  Medications Ordered in ED Medications  rabies immune globulin  (HYPERAB/KEDRAB) injection 1,200 Units (has no administration in time range)  rabies vaccine (RABAVERT) injection 1 mL (has no administration in time range)     Initial Impression / Assessment and Plan / ED Course  I have reviewed the triage vital signs and the nursing notes.  Pertinent labs & imaging results that were available during my care of the  patient were reviewed by me and considered in my medical decision making (see chart for details).    Presents for possible exposure to rabies, she was playing outside with her dog when the skunk bit her dog and she opened the dog's mouth with her bare hands.  She was informed by all control yesterday that patient dog was exposed to rabies and she needed to be seen in the ED for possible vaccines.  Denies any symptoms at the moment, fevers, chills she is well-appearing. Provide patient with rabies vaccine while in the ED along with follow-up vaccines to be given in the urgent care setting.  She understands and agrees with management.  Will provide patient with a letter for her follow-up series to be administered at [facility] urgent care.  Final Clinical Impressions(s) / ED Diagnoses   Final diagnoses:  Rabies exposure    ED Discharge Orders    None       Claude Manges, PA-C 03/19/18 [id number]    Arby Barrette, MD 03/21/18 1301

## 2018-03-19 NOTE — ED Triage Notes (Signed)
Pt in after a rabid skunk exposure, did not have a bite but touched the saliva, was recommended to come in for vaccination series

## 2018-03-22 ENCOUNTER — Ambulatory Visit (HOSPITAL_COMMUNITY)
Admission: EM | Admit: 2018-03-22 | Discharge: 2018-03-22 | Disposition: A | Discharge status: Home / Self Care | Payer: 59 | Attending: Family Medicine | Admitting: Family Medicine

## 2018-03-22 DIAGNOSIS — Z203 Contact with and (suspected) exposure to rabies: Secondary | ICD-10-CM | POA: Diagnosis not present

## 2018-03-22 DIAGNOSIS — Z23 Encounter for immunization: Secondary | ICD-10-CM | POA: Diagnosis not present

## 2018-03-22 MED ORDER — RABIES VACCINE, PCEC IM SUSR
INTRAMUSCULAR | Status: AC
  Filled 2018-03-22: qty 1

## 2018-03-22 MED ORDER — RABIES VACCINE, PCEC IM SUSR
1.0000 mL | Freq: Once | INTRAMUSCULAR | Status: AC
  Administered 2018-03-22: 1 mL via INTRAMUSCULAR

## 2018-03-22 NOTE — ED Triage Notes (Signed)
Pt presents for second rabies injection  

## 2018-03-26 ENCOUNTER — Encounter (HOSPITAL_COMMUNITY): Payer: Self-pay | Admitting: Emergency Medicine

## 2018-03-26 ENCOUNTER — Ambulatory Visit (HOSPITAL_COMMUNITY)
Admission: EM | Admit: 2018-03-26 | Discharge: 2018-03-26 | Disposition: A | Discharge status: Home / Self Care | Payer: 59 | Attending: Family Medicine | Admitting: Family Medicine

## 2018-03-26 MED ORDER — RABIES VACCINE, PCEC IM SUSR
INTRAMUSCULAR | Status: AC
  Filled 2018-03-26: qty 1

## 2018-03-26 MED ORDER — RABIES VACCINE, PCEC IM SUSR
1.0000 mL | Freq: Once | INTRAMUSCULAR | Status: AC
  Administered 2018-03-26: 1 mL via INTRAMUSCULAR

## 2018-03-26 NOTE — ED Triage Notes (Signed)
Here for 3rd rabies injection

## 2018-04-02 ENCOUNTER — Ambulatory Visit (HOSPITAL_COMMUNITY)
Admission: EM | Admit: 2018-04-02 | Discharge: 2018-04-02 | Disposition: A | Discharge status: Home / Self Care | Payer: 59 | Attending: Family Medicine | Admitting: Family Medicine

## 2018-04-02 DIAGNOSIS — Z23 Encounter for immunization: Secondary | ICD-10-CM

## 2018-04-02 DIAGNOSIS — Z203 Contact with and (suspected) exposure to rabies: Secondary | ICD-10-CM

## 2018-04-02 MED ORDER — RABIES VACCINE, PCEC IM SUSR
1.0000 mL | Freq: Once | INTRAMUSCULAR | Status: AC
  Administered 2018-04-02: 1 mL via INTRAMUSCULAR

## 2018-04-02 MED ORDER — RABIES VACCINE, PCEC IM SUSR
INTRAMUSCULAR | Status: AC
  Filled 2018-04-02: qty 1

## 2018-04-02 NOTE — ED Triage Notes (Signed)
Pt presents to have last rabies injection.

## 2018-04-10 ENCOUNTER — Other Ambulatory Visit: Payer: Self-pay | Admitting: Obstetrics and Gynecology

## 2018-07-10 ENCOUNTER — Other Ambulatory Visit: Payer: Self-pay | Admitting: Family Medicine

## 2018-07-11 NOTE — Telephone Encounter (Signed)
Pt advised and voiced understanding.    She will call back to schedule an apt for CPE.

## 2018-07-11 NOTE — Telephone Encounter (Signed)
Please inform patient the following information: I have received a refill request for her Macrobid prescribed for frequent UTI prophylaxis. -She is overdue for follow-up on this condition.  Agreed to refill for 30 days, this has been sent in for her --> the schedule her for follow-up appointment on her frequent UTIs--if she has been stable on this medication and desires a physical, she can be scheduled for physical and will refill her UTI medication at that time.  Must begin a physical slot 30 minutes if she desires a physical.

## 2018-09-10 ENCOUNTER — Other Ambulatory Visit: Payer: Self-pay | Admitting: Family

## 2018-09-12 ENCOUNTER — Inpatient Hospital Stay: Payer: 59

## 2018-09-12 ENCOUNTER — Inpatient Hospital Stay: Payer: 59 | Admitting: Family

## 2018-09-30 ENCOUNTER — Encounter: Payer: Self-pay | Admitting: Family

## 2018-09-30 ENCOUNTER — Inpatient Hospital Stay (HOSPITAL_BASED_OUTPATIENT_CLINIC_OR_DEPARTMENT_OTHER): Payer: BLUE CROSS/BLUE SHIELD | Admitting: Family

## 2018-09-30 ENCOUNTER — Inpatient Hospital Stay: Payer: BLUE CROSS/BLUE SHIELD | Attending: Family

## 2018-09-30 ENCOUNTER — Other Ambulatory Visit: Payer: Self-pay

## 2018-09-30 DIAGNOSIS — I2699 Other pulmonary embolism without acute cor pulmonale: Secondary | ICD-10-CM | POA: Diagnosis not present

## 2018-09-30 DIAGNOSIS — D6852 Prothrombin gene mutation: Secondary | ICD-10-CM

## 2018-09-30 DIAGNOSIS — Z7901 Long term (current) use of anticoagulants: Secondary | ICD-10-CM | POA: Diagnosis not present

## 2018-09-30 LAB — CBC WITH DIFFERENTIAL (CANCER CENTER ONLY)
Abs Immature Granulocytes: 0.01 10*3/uL (ref 0.00–0.07)
Basophils Absolute: 0 10*3/uL (ref 0.0–0.1)
Basophils Relative: 0 %
Eosinophils Absolute: 0 10*3/uL (ref 0.0–0.5)
Eosinophils Relative: 1 %
HCT: 43.2 % (ref 36.0–46.0)
Hemoglobin: 14.4 g/dL (ref 12.0–15.0)
Immature Granulocytes: 0 %
Lymphocytes Relative: 37 %
Lymphs Abs: 1.8 10*3/uL (ref 0.7–4.0)
MCH: 33.1 pg (ref 26.0–34.0)
MCHC: 33.3 g/dL (ref 30.0–36.0)
MCV: 99.3 fL (ref 80.0–100.0)
Monocytes Absolute: 0.3 10*3/uL (ref 0.1–1.0)
Monocytes Relative: 7 %
Neutro Abs: 2.6 10*3/uL (ref 1.7–7.7)
Neutrophils Relative %: 55 %
Platelet Count: 285 10*3/uL (ref 150–400)
RBC: 4.35 MIL/uL (ref 3.87–5.11)
RDW: 11.9 % (ref 11.5–15.5)
WBC Count: 4.8 10*3/uL (ref 4.0–10.5)
nRBC: 0 % (ref 0.0–0.2)

## 2018-09-30 LAB — CMP (CANCER CENTER ONLY)
ALT: 21 U/L (ref 0–44)
AST: 27 U/L (ref 15–41)
Albumin: 4.8 g/dL (ref 3.5–5.0)
Alkaline Phosphatase: 65 U/L (ref 38–126)
Anion gap: 11 (ref 5–15)
BUN: 15 mg/dL (ref 6–20)
CO2: 26 mmol/L (ref 22–32)
Calcium: 9.9 mg/dL (ref 8.9–10.3)
Chloride: 100 mmol/L (ref 98–111)
Creatinine: 0.92 mg/dL (ref 0.44–1.00)
GFR, Est AFR Am: >60 mL/min (ref >60)
GFR, Estimated: >60 mL/min (ref >60)
Glucose, Bld: 108 mg/dL — ABNORMAL HIGH (ref 70–99)
Potassium: 3.8 mmol/L (ref 3.5–5.1)
Sodium: 137 mmol/L (ref 135–145)
Total Bilirubin: 1 mg/dL (ref 0.3–1.2)
Total Protein: 7.9 g/dL (ref 6.5–8.1)

## 2018-09-30 MED ORDER — APIXABAN 2.5 MG PO TABS: 2.5000 mg | ORAL_TABLET | Freq: Two times a day (BID) | ORAL | 5 refills | Status: DC

## 2018-09-30 NOTE — Progress Notes (Signed)
Hematology and Oncology Follow Up Visit  [name] [name] [id number] 1975-06-29 43 y.o. 09/30/2018   Principle Diagnosis:  Heterozygous Prothrombin II gene mutation Bilateral pulmonary emboli-on oral contraceptives Multiple miscarriages  Current Therapy:   Eliquis 5 mg p.o. twice daily-to complete 1 year of therapeutic dosing in July 2020   Interim History:  [name] [name] is here today for follow-up. She is doing well but still has occasional episodes of mild SOB. This comes and goes.  She has been taking her Eliquis once a day most days. She states that she forgets the evening dose.  She has had no episodes of bleeding. She will occasionally bruise but not in excess. No petechiae.  She states that she had a uterine ablation in January due to heavy cycles.  She has history of palpitations since childhood. This is unchanged from her baseline.  No fever, chills, n/v, cough, rash, dizziness, chest pain, abdominal pain or changes in bowel or bladder habits.  She will occasionally get twinges of pain in her left side when in certain positions.  No lymphadenopathy noted on exam.  She is eating well and staying hydrated. Her weight is stable.  She is no visible distress at this time.   ECOG Performance Status: 1 - Symptomatic but completely ambulatory  Medications:  Allergies as of 09/30/2018      Reactions   Sulfa Antibiotics Anaphylaxis, Hives   Erythromycin Other (See Comments)   unknown As a child, unknown reaction   Morphine Other (See Comments)   aggressiveness   Morphine And Related Itching, Other (See Comments)   hallucinations      Medication List       Accurate as of September 30, 2018 10:15 AM. If you have any questions, ask your nurse or doctor.        apixaban 5 MG Tabs tablet Commonly known as:  Eliquis Take 1 tablet (5 mg total) by mouth 2 (two) times daily.   nitrofurantoin (macrocrystal-monohydrate) 100 MG capsule Commonly known as:  MACROBID TAKE 1 CAPSULE (100 MG  TOTAL) BY MOUTH AT BEDTIME.   norethindrone 0.35 MG tablet Commonly known as:  MICRONOR Take 1 tablet by mouth daily.       Allergies:  Allergies  Allergen Reactions  . Sulfa Antibiotics Anaphylaxis and Hives  . Erythromycin Other (See Comments)    unknown As a child, unknown reaction  . Morphine Other (See Comments)    aggressiveness  . Morphine And Related Itching and Other (See Comments)    hallucinations    Past Medical History, Surgical history, Social history, and Family History were reviewed and updated.  Review of Systems: All other 10 point review of systems is negative.   Physical Exam:  vitals were not taken for this visit.   Wt Readings from Last 3 Encounters:  03/19/18 133 lb (60.3 kg)  03/15/18 133 lb (60.3 kg)  01/17/18 128 lb (58.1 kg)    Ocular: Sclerae unicteric, pupils equal, round and reactive to light Ear-nose-throat: Oropharynx clear, dentition fair Lymphatic: No cervical or supraclavicular adenopathy Lungs no rales or rhonchi, good excursion bilaterally Heart regular rate and rhythm, no murmur appreciated Abd soft, nontender, positive bowel sounds, no liver or spleen tip palpated on exam, no fluid wave  MSK no focal spinal tenderness, no joint edema Neuro: non-focal, well-oriented, appropriate affect Breasts: Deferred   Lab Results  Component Value Date   WBC 4.8 09/30/2018   HGB 14.4 09/30/2018   HCT 43.2 09/30/2018   MCV 99.3  09/30/2018   PLT 285 09/30/2018   Lab Results  Component Value Date   FERRITIN 185 (H) 10/16/2017   IRON 25 (L) 10/16/2017   TIBC 393 10/16/2017   IRONPCTSAT 6 (L) 10/16/2017   Lab Results  Component Value Date   RBC 4.35 09/30/2018   No results found for: KPAFRELGTCHN, LAMBDASER, [facility] Lab Results  Component Value Date   IGA 264 12/19/2006   No results found for: Odetta Pink, SPEI   Chemistry      Component Value Date/Time   NA 141  03/15/2018 1435   K 3.8 03/15/2018 1435   CL 107 03/15/2018 1435   CO2 27 03/15/2018 1435   BUN 14 03/15/2018 1435   CREATININE 0.80 03/15/2018 1435   CREATININE 0.86 10/16/2017 1454      Component Value Date/Time   CALCIUM 9.6 03/15/2018 1435   ALKPHOS 61 03/15/2018 1435   AST 25 03/15/2018 1435   ALT 20 03/15/2018 1435   BILITOT 0.7 03/15/2018 1435       Impression and Plan: [name] [name] is a very pleasant 43 yo caucasian female heterozygous for the Prothrombin II gene mutation.  Overall, she is doing well. I spoke with Dr. Marin Olp and we will now decrease her Eliquis to 2.5 mg PO BID. I stressed the importance that she taker her medicine the prescribed 2 times daily. She verbalized understanding.  We will plan to see her back in another 6 months.  She will contact our office with any questions or concerns. We can certainly see her sooner if needed.    Laverna Peace, NP 6/8/202010:15 AM

## 2018-10-02 ENCOUNTER — Telehealth: Payer: Self-pay | Admitting: Hematology & Oncology

## 2018-10-02 NOTE — Telephone Encounter (Signed)
Appointments scheduled letter mailed per 6/8 los °

## 2019-01-07 ENCOUNTER — Encounter: Payer: Self-pay | Admitting: Family Medicine

## 2019-01-07 ENCOUNTER — Other Ambulatory Visit: Payer: Self-pay

## 2019-01-07 ENCOUNTER — Ambulatory Visit: Payer: BLUE CROSS/BLUE SHIELD | Admitting: Family Medicine

## 2019-01-07 VITALS — BP 124/84 | HR 89 | Temp 99.6°F | Resp 16 | Ht 63.0 in | Wt 120.1 lb

## 2019-01-07 DIAGNOSIS — M6281 Muscle weakness (generalized): Secondary | ICD-10-CM | POA: Diagnosis not present

## 2019-01-07 DIAGNOSIS — R200 Anesthesia of skin: Secondary | ICD-10-CM

## 2019-01-07 DIAGNOSIS — M2550 Pain in unspecified joint: Secondary | ICD-10-CM | POA: Diagnosis not present

## 2019-01-07 DIAGNOSIS — R202 Paresthesia of skin: Secondary | ICD-10-CM | POA: Diagnosis not present

## 2019-01-07 DIAGNOSIS — R2689 Other abnormalities of gait and mobility: Secondary | ICD-10-CM

## 2019-01-07 NOTE — Patient Instructions (Signed)
We will start with labs and if they are normal- we will need to get MRI and consider Neurology referral. Once I get everything back we will discuss further management.   If any worsening symptoms or stroke- like symptoms please report directly to the ED

## 2019-01-07 NOTE — Progress Notes (Signed)
[name] [name] , [birth year], 43 y.o., female MRN: [id number] Patient Care Team    Relationship Specialty Notifications Start End  [name], DO PCP - General Family Medicine  10/16/17     Chief Complaint  Patient presents with  . Numbness    Pt has been having symptoms for 2 months. Started on right side of body, now is in hands and legs. Joint pain all over. Loss of balance.   . Tingling  . Fatigue     Subjective: Pt presents for an OV with complaints of paresthesias, joint pain and gait instability of 2 months duration.  Patient reports she has noticed when standing for long periods of time she will feel like she is losing her balance.  Is not particularly to one side or the other just having the sensation of feeling off balance.  She has had symptoms of paresthesias, numbness and tingling of her upper and lower extremities, which first started on the right side and has now spread to the left side.  She also endorses feeling joint pain and overall fatigue.  She has a significant medical history of bilateral pulmonary embolism believed to be secondary to her prothrombin gene mutation.  She is prescribed Eliquis 2.5 mg twice daily.  She admits she typically only takes about once a day.  She denies any chest pain, shortness of breath or dizziness.  She denies any syncope.  She denies fever, chills, nausea, vomit or unintentional weight loss.  Depression screen [facility] 2/9 10/16/2017  Decreased Interest 0  Down, Depressed, Hopeless 0  PHQ - 2 Score 0  Altered sleeping 1  Tired, decreased energy 0  Change in appetite 0  Feeling bad or failure about yourself  0  Moving slowly or fidgety/restless 0  Suicidal thoughts 0  PHQ-9 Score 1    Allergies  Allergen Reactions  . Sulfa Antibiotics Anaphylaxis and Hives  . Erythromycin Other (See Comments)    unknown As a child, unknown reaction  . Morphine Other (See Comments)    aggressiveness  . Morphine And Related Itching and Other (See  Comments)    hallucinations   Social History   Social History Narrative   Married, college-educated.  Works in Press photographer.  4 children.   Social drinker.  Former smoker.   Exercises 3 times a week.   Smoke alarm in the home.  Wears her seatbelt.   Feels safe in her relationships.   Past Medical History:  Diagnosis Date  . AMA (advanced maternal age) multigravida 36+   . Arrhythmia    Mobitz type 1  . Chicken pox   . Depression   . Depression with anxiety   . Frequent headaches   . Heart murmur    as a child  . Tachycardia    as a child  . Thyroid disease   . Urinary incontinence    Past Surgical History:  Procedure Laterality Date  . ABDOMINOPLASTY    . BREAST ENHANCEMENT SURGERY    . CESAREAN SECTION    . CESAREAN SECTION Bilateral 11/01/2016   Procedure: CESAREAN SECTION;  Surgeon: Olga Millers, MD;  [facility];  Service: Obstetrics;  Laterality: Bilateral;  . KIDNEY SURGERY     congenital abnormality  of ureters  . MANDIBLE SURGERY    . TONSILLECTOMY     Family History  Problem Relation Age of Onset  . Diabetes Mother   . Arthritis Mother   . Depression Mother   .  Stroke Mother   . Miscarriages / [place] Mother   . Mental illness Mother   . Hyperlipidemia Father   . Hypertension Father   . Alcohol abuse Father   . Arthritis Father   . Depression Father   . Drug abuse Father   . Hearing loss Father   . Cancer Paternal Aunt   . Cancer Paternal Grandmother   . Ovarian cancer Paternal Grandmother   . Breast cancer Paternal Grandmother   . Mental illness Sister   . Asthma Brother   . Depression Brother   . Asthma Son   . Depression Maternal Grandmother   . Diabetes Maternal Grandmother   . Heart disease Maternal Grandfather   . Kidney disease Maternal Grandfather   . Stroke Maternal Grandfather   . Alcohol abuse Paternal Grandfather   . Asthma Paternal Grandfather   . Depression Paternal Grandfather   . Drug abuse Paternal  Grandfather   . Hearing loss Paternal Grandfather   . Heart disease Paternal Grandfather   . Hyperlipidemia Paternal Grandfather   . Mental illness Paternal Grandfather    Allergies as of 01/07/2019      Reactions   Sulfa Antibiotics Anaphylaxis, Hives   Erythromycin Other (See Comments)   unknown As a child, unknown reaction   Morphine Other (See Comments)   aggressiveness   Morphine And Related Itching, Other (See Comments)   hallucinations      Medication List       Accurate as of January 07, 2019 11:59 PM. If you have any questions, ask your nurse or doctor.        STOP taking these medications   oxyCODONE-acetaminophen 5-325 MG tablet Commonly known as: PERCOCET/ROXICET Stopped by: Howard Pouch, DO     TAKE these medications   apixaban 2.5 MG Tabs tablet Commonly known as: Eliquis Take 1 tablet (2.5 mg total) by mouth 2 (two) times daily.   nitrofurantoin (macrocrystal-monohydrate) 100 MG capsule Commonly known as: MACROBID TAKE 1 CAPSULE (100 MG TOTAL) BY MOUTH AT BEDTIME.   Vyvanse 60 MG capsule Generic drug: lisdexamfetamine Take 60 mg by mouth every morning.       All past medical history, surgical history, allergies, family history, immunizations andmedications were updated in the EMR today and reviewed under the history and medication portions of their EMR.     ROS: Negative, with the exception of above mentioned in HPI   Objective:  BP 124/84 (BP Location: Left Arm, Patient Position: Sitting, Cuff Size: Normal)   Pulse 89   Temp 99.6 F (37.6 C) (Temporal)   Resp 16   Ht '5\' 3"'$  (1.6 m)   Wt 120 lb 2 oz (54.5 kg)   SpO2 100%   BMI 21.28 kg/m  Body mass index is 21.28 kg/m. Gen: Afebrile. No acute distress. Nontoxic in appearance, well developed, well nourished.  HENT: AT. [place]. Bilateral TM visualized without erythema or bulging. MMM, no oral lesions.  No cough or shortness of breath. Eyes:Pupils Equal Round Reactive to light, Extraocular  movements intact,  Conjunctiva without redness, discharge or icterus. Neck/lymp/endocrine: Supple, no lymphadenopathy, no thyromegaly CV: RRR no murmur, no edema Chest: CTAB, no wheeze or crackles. Good air movement, normal resp effort.  MSK: No erythema, no joint swelling, moves all 4 extremities within normal limits.  MSK 5/5 upper and lower extremities. Skin: No rashes, purpura or petechiae.  Neuro:  Normal gait. PERLA. EOMi. Alert. Oriented x3 Cranial nerves II through XII intact. Psych: Normal affect, dress and demeanor.  Normal speech. Normal thought content and judgment.  No exam data present No results found. No results found for this or any previous visit (from the past 24 hour(s)).  Assessment/Plan: LORELIE BIERMANN is a 43 y.o. female present for OV for  Polyarthralgia/myalgia/muscle weakness/balance problems/numbness and tingling of bilateral upper and lower extremities Uncertain etiology of her constellations of symptoms.  Will initiate work-up to rule out endocrine disorder, iron or vitamin deficiencies, inflammatory causes and autoimmune causes. -Patient was encouraged to take her Eliquis as prescribed twice daily.  Not doing so may put her at risk for blood clots which could cause additional PEs versus stroke. - TSH - Vitamin D (25 hydroxy) - CBC w/Diff - Iron, TIBC and Ferritin Panel - ANA, IFA Comprehensive Panel-([facility]) - Comp Met (CMET) - B12 and Folate Panel - Vitamin B1 - Magnesium - Rheumatoid Factor - Cyclic citrul peptide antibody, IgG ([facility]) - Sedimentation rate - C-reactive protein - PTH, Intact and Calcium -If labs do not indicate potential cause of her symptoms, would have low threshold to consider neurology referral versus MRI.   Reviewed expectations re: course of current medical issues.  Discussed self-management of symptoms.  Outlined signs and symptoms indicating need for more acute intervention.  Patient verbalized understanding and all  questions were answered.  Patient received an After-Visit Summary.    Orders Placed This Encounter  Procedures  . TSH  . Vitamin D (25 hydroxy)  . CBC w/Diff  . Iron, TIBC and Ferritin Panel  . ANA, IFA Comprehensive Panel-([facility])  . Comp Met (CMET)  . B12 and Folate Panel  . Vitamin B1  . Magnesium  . Rheumatoid Factor  . Cyclic citrul peptide antibody, IgG ([facility])  . Sedimentation rate  . C-reactive protein  . PTH, Intact and Calcium    > 25 minutes spent with patient, >50% of time spent face to face    Note is dictated utilizing voice recognition software. Although note has been proof read prior to signing, occasional typographical errors still can be missed. If any questions arise, please do not hesitate to call for verification.   electronically signed by:  Howard Pouch, DO  [place]

## 2019-01-08 LAB — CBC WITH DIFFERENTIAL/PLATELET
Basophils Absolute: 0 10*3/uL (ref 0.0–0.1)
Basophils Relative: 0.7 % (ref 0.0–3.0)
Eosinophils Absolute: 0 10*3/uL (ref 0.0–0.7)
Eosinophils Relative: 0.2 % (ref 0.0–5.0)
HCT: 39.6 % (ref 36.0–46.0)
Hemoglobin: 13.4 g/dL (ref 12.0–15.0)
Lymphocytes Relative: 25.9 % (ref 12.0–46.0)
Lymphs Abs: 1.6 10*3/uL (ref 0.7–4.0)
MCHC: 33.9 g/dL (ref 30.0–36.0)
MCV: 101.4 fl — ABNORMAL HIGH (ref 78.0–100.0)
Monocytes Absolute: 0.3 10*3/uL (ref 0.1–1.0)
Monocytes Relative: 4.6 % (ref 3.0–12.0)
Neutro Abs: 4.3 10*3/uL (ref 1.4–7.7)
Neutrophils Relative %: 68.6 % (ref 43.0–77.0)
Platelets: 291 10*3/uL (ref 150.0–400.0)
RBC: 3.9 Mil/uL (ref 3.87–5.11)
RDW: 12.2 % (ref 11.5–15.5)
WBC: 6.2 10*3/uL (ref 4.0–10.5)

## 2019-01-08 LAB — B12 AND FOLATE PANEL
Folate: 12.9 ng/mL (ref >5.9)
Vitamin B-12: 248 pg/mL (ref 211–911)

## 2019-01-08 LAB — MAGNESIUM: Magnesium: 2 mg/dL (ref 1.5–2.5)

## 2019-01-08 LAB — COMPREHENSIVE METABOLIC PANEL
ALT: 19 U/L (ref 0–35)
AST: 23 U/L (ref 0–37)
Albumin: 4.4 g/dL (ref 3.5–5.2)
Alkaline Phosphatase: 57 U/L (ref 39–117)
BUN: 13 mg/dL (ref 6–23)
CO2: 25 mEq/L (ref 19–32)
Calcium: 9.5 mg/dL (ref 8.4–10.5)
Chloride: 101 mEq/L (ref 96–112)
Creatinine, Ser: 0.77 mg/dL (ref 0.40–1.20)
GFR: 82.37 mL/min (ref >60.00)
Glucose, Bld: 83 mg/dL (ref 70–99)
Potassium: 3.9 mEq/L (ref 3.5–5.1)
Sodium: 137 mEq/L (ref 135–145)
Total Bilirubin: 0.9 mg/dL (ref 0.2–1.2)
Total Protein: 7.4 g/dL (ref 6.0–8.3)

## 2019-01-08 LAB — VITAMIN D 25 HYDROXY (VIT D DEFICIENCY, FRACTURES): VITD: 71.06 ng/mL (ref 30.00–100.00)

## 2019-01-08 LAB — SEDIMENTATION RATE: Sed Rate: 1 mm/hr (ref 0–20)

## 2019-01-08 LAB — C-REACTIVE PROTEIN: CRP: <1 mg/dL (ref 0.5–20.0)

## 2019-01-08 LAB — TSH: TSH: 0.87 u[IU]/mL (ref 0.35–4.50)

## 2019-01-09 ENCOUNTER — Telehealth: Payer: Self-pay | Admitting: Family Medicine

## 2019-01-09 NOTE — Telephone Encounter (Signed)
Pt has not taken supplements since June/July 2019. Her diet does not consist of anything with high iron. Pt agreed to take B12 injections.   She does have appt with oncology/hematology 04/01/2019 already and wants to know if she can stay at that same place. Diane we need to make sure they have these labs and see if another sooner appt needs to be made for the patient given her lab results.   She will call if needed after 4 weeks to give update on how she is feeling.

## 2019-01-09 NOTE — Telephone Encounter (Signed)
Please inform patient the following information: - there are 2 labs pending that will not be resulted until next week. -Current resulted labs are all normal with the exception of the following    -Her iron is too high now.  If she is taking any iron supplementation she should stop.  Avoid high iron content meals.  Is she taking iron still?     -Her red blood cells are larger than normal. Her B12 is extremely low at 248.,  And this is a common cause for enlarged blood cells.  Elevated iron or extremely low levels of B12 can both cause symptoms of muscle weakness and fatigue.   -If she is not taking iron, then we will need to get her to a hematologist to discuss her elevated iron.  If she is taking iron stop iron and we will retested in 2 months.    -I would recommend she start B12 injections immediately.  This can be provided by nurse visit for B12 injection once weekly for 4 injections, followed by every 2 weeks for 4 injections.     If she does not see at least and improvement within 4 weeks of starting the above recommendations or if she is worsening and she will need to follow-up here immediately.  Otherwise if she is improving follow-up in 3 months, 1 week after her last B12 injection for reevaluation and testing.

## 2019-01-09 NOTE — Telephone Encounter (Signed)
I would encourage she call her hematologist and get in sooner and let them know her iron is elevated and she is having symptoms of muscle weakness.

## 2019-01-10 ENCOUNTER — Ambulatory Visit (INDEPENDENT_AMBULATORY_CARE_PROVIDER_SITE_OTHER): Payer: BLUE CROSS/BLUE SHIELD | Admitting: Family Medicine

## 2019-01-10 ENCOUNTER — Other Ambulatory Visit: Payer: Self-pay

## 2019-01-10 DIAGNOSIS — E538 Deficiency of other specified B group vitamins: Secondary | ICD-10-CM

## 2019-01-10 MED ORDER — CYANOCOBALAMIN 1000 MCG/ML IJ SOLN
1000.0000 ug | Freq: Once | INTRAMUSCULAR | Status: AC
  Administered 2019-01-10: 1000 ug via INTRAMUSCULAR

## 2019-01-10 NOTE — Progress Notes (Addendum)
[name] [name] is a 43 y.o. female presents to the office today for B12 njections, per physician's orders. Original order: 01/09/2019 -start B12 injections immediately.  This can be provided by nurse visit for B12 injection once weekly for 4 injections, followed by every 2 weeks for 4 injections  Vitamin B12 1029mcg IM was administered right deltoid today. Patient tolerated injection. Patient due for follow up labs/provider appt: Yes. Date due: 01/30/2019, appt made Yes Patient next injection due: 01/17/2019, appt made Yes  Kathie Dike., CMA  Dr Anitra Lauth, please sign off in Dr Lucita Lora absence.  Thanks!  Medical screening examination/treatment/procedure(s) were performed by non-physician practitioner and as supervising physician I was immediately available for consultation/collaboration.  I agree with above assessment and plan.  Electronically Signed by: Howard Pouch, DO [facility] [place]

## 2019-01-13 ENCOUNTER — Inpatient Hospital Stay: Payer: BLUE CROSS/BLUE SHIELD

## 2019-01-13 ENCOUNTER — Other Ambulatory Visit: Payer: Self-pay

## 2019-01-13 ENCOUNTER — Encounter: Payer: Self-pay | Admitting: Family

## 2019-01-13 ENCOUNTER — Inpatient Hospital Stay: Payer: BLUE CROSS/BLUE SHIELD | Attending: Family | Admitting: Family

## 2019-01-13 ENCOUNTER — Other Ambulatory Visit: Payer: Self-pay | Admitting: Family

## 2019-01-13 VITALS — BP 116/79 | HR 80 | Temp 98.7°F | Resp 18 | Ht 63.0 in | Wt 120.0 lb

## 2019-01-13 DIAGNOSIS — D6852 Prothrombin gene mutation: Secondary | ICD-10-CM

## 2019-01-13 DIAGNOSIS — Z7901 Long term (current) use of anticoagulants: Secondary | ICD-10-CM | POA: Insufficient documentation

## 2019-01-13 DIAGNOSIS — R7989 Other specified abnormal findings of blood chemistry: Secondary | ICD-10-CM

## 2019-01-13 DIAGNOSIS — R790 Abnormal level of blood mineral: Secondary | ICD-10-CM

## 2019-01-13 DIAGNOSIS — I2699 Other pulmonary embolism without acute cor pulmonale: Secondary | ICD-10-CM | POA: Insufficient documentation

## 2019-01-13 LAB — IRON,TIBC AND FERRITIN PANEL
%SAT: 68 % (calc) — ABNORMAL HIGH (ref 16–45)
Ferritin: 153 ng/mL (ref 16–232)
Iron: 233 ug/dL — ABNORMAL HIGH (ref 40–190)
TIBC: 344 mcg/dL (calc) (ref 250–450)

## 2019-01-13 LAB — CBC WITH DIFFERENTIAL (CANCER CENTER ONLY)
Abs Immature Granulocytes: 0.02 10*3/uL (ref 0.00–0.07)
Basophils Absolute: 0 10*3/uL (ref 0.0–0.1)
Basophils Relative: 0 %
Eosinophils Absolute: 0 10*3/uL (ref 0.0–0.5)
Eosinophils Relative: 0 %
HCT: 38.8 % (ref 36.0–46.0)
Hemoglobin: 13 g/dL (ref 12.0–15.0)
Immature Granulocytes: 0 %
Lymphocytes Relative: 30 %
Lymphs Abs: 2.1 10*3/uL (ref 0.7–4.0)
MCH: 33.6 pg (ref 26.0–34.0)
MCHC: 33.5 g/dL (ref 30.0–36.0)
MCV: 100.3 fL — ABNORMAL HIGH (ref 80.0–100.0)
Monocytes Absolute: 0.4 10*3/uL (ref 0.1–1.0)
Monocytes Relative: 6 %
Neutro Abs: 4.4 10*3/uL (ref 1.7–7.7)
Neutrophils Relative %: 64 %
Platelet Count: 286 10*3/uL (ref 150–400)
RBC: 3.87 MIL/uL (ref 3.87–5.11)
RDW: 11.7 % (ref 11.5–15.5)
WBC Count: 6.9 10*3/uL (ref 4.0–10.5)
nRBC: 0 % (ref 0.0–0.2)

## 2019-01-13 LAB — CMP (CANCER CENTER ONLY)
ALT: 15 U/L (ref 0–44)
AST: 19 U/L (ref 15–41)
Albumin: 4.3 g/dL (ref 3.5–5.0)
Alkaline Phosphatase: 55 U/L (ref 38–126)
Anion gap: 11 (ref 5–15)
BUN: 13 mg/dL (ref 6–20)
CO2: 24 mmol/L (ref 22–32)
Calcium: 9.2 mg/dL (ref 8.9–10.3)
Chloride: 104 mmol/L (ref 98–111)
Creatinine: 0.89 mg/dL (ref 0.44–1.00)
GFR, Est AFR Am: >60 mL/min (ref >60)
GFR, Estimated: >60 mL/min (ref >60)
Glucose, Bld: 86 mg/dL (ref 70–99)
Potassium: 3.5 mmol/L (ref 3.5–5.1)
Sodium: 139 mmol/L (ref 135–145)
Total Bilirubin: 0.5 mg/dL (ref 0.3–1.2)
Total Protein: 7.6 g/dL (ref 6.5–8.1)

## 2019-01-13 LAB — PTH, INTACT AND CALCIUM
Calcium: 9.5 mg/dL (ref 8.6–10.2)
PTH: 31 pg/mL (ref 14–64)

## 2019-01-13 LAB — RHEUMATOID FACTOR: Rhuematoid fact SerPl-aCnc: <14 IU/mL (ref <14)

## 2019-01-13 LAB — SAVE SMEAR(SSMR), FOR PROVIDER SLIDE REVIEW

## 2019-01-13 LAB — D-DIMER, QUANTITATIVE: D-Dimer, Quant: <0.27 ug/mL-FEU (ref 0.00–0.50)

## 2019-01-13 LAB — VITAMIN B1

## 2019-01-13 LAB — RETICULOCYTES
Immature Retic Fract: 7 % (ref 2.3–15.9)
RBC.: 3.81 MIL/uL — ABNORMAL LOW (ref 3.87–5.11)
Retic Count, Absolute: 71.2 10*3/uL (ref 19.0–186.0)
Retic Ct Pct: 1.9 % (ref 0.4–3.1)

## 2019-01-13 LAB — CYCLIC CITRUL PEPTIDE ANTIBODY, IGG: Cyclic Citrullin Peptide Ab: <16 UNITS

## 2019-01-13 NOTE — Progress Notes (Signed)
Hematology and Oncology Follow Up Visit  [name] [name] [id number] 11-13-1975 43 y.o. 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 twice daily-to complete 1 year of therapeutic dosing in July 2020   Interim History:  Ms. [name] [name] is here today at the recommendation of her PCP for recent elevation in her iron counts.  Iron saturation last week was 68% and ferritin 153. This is quite random as she is usually low with her iron.  She is symptomatic with intermittent numbness and tingling now extending into both arms and legs. She has noted that she is dropping things.  She states that her B 12 was low (248) and she received her first injection last week on Friday. She is on injection schedule with her PCP.  She has had a notable rash that will pop out on her face that itches. This comes and goes.  She also states that she will sometimes itch after a hot bath or shower. She denies taking any iron supplements or using any workout supplements.  She has had palpitations, chest discomfort, dizziness and blurry vision.  She will occasionally has headaches.  No swelling in her extremities. She states that she has significant aches and pain in her joints.  No falls or syncopal episodes.  She states that she had been worked up for MS which was negative.  No episodes of bleeding, no bruising or petechiae.  She is eating well and staying hydrated. Her weight is stable.   ECOG Performance Status: 1 - Symptomatic but completely ambulatory  Medications:  Allergies as of 01/13/2019      Reactions   Sulfa Antibiotics Anaphylaxis, Hives   Erythromycin Other (See Comments)   unknown As a child, unknown reaction   Morphine Other (See Comments)   aggressiveness   Morphine And Related Itching, Other (See Comments)   hallucinations      Medication List       Accurate as of January 13, 2019  3:15 PM. If you have any questions, ask your nurse or doctor.        apixaban 2.5 MG Tabs tablet Commonly known as: Eliquis Take 1 tablet (2.5 mg total) by mouth 2 (two) times daily.   nitrofurantoin (macrocrystal-monohydrate) 100 MG capsule Commonly known as: MACROBID TAKE 1 CAPSULE (100 MG TOTAL) BY MOUTH AT BEDTIME.   Vyvanse 60 MG capsule Generic drug: lisdexamfetamine Take 60 mg by mouth every morning.       Allergies:  Allergies  Allergen Reactions  . Sulfa Antibiotics Anaphylaxis and Hives  . Erythromycin Other (See Comments)    unknown As a child, unknown reaction  . Morphine Other (See Comments)    aggressiveness  . Morphine And Related Itching and Other (See Comments)    hallucinations    Past Medical History, Surgical history, Social history, and Family History were reviewed and updated.  Review of Systems: All other 10 point review of systems is negative.   Physical Exam:  vitals were not taken for this visit.   Wt Readings from Last 3 Encounters:  01/07/19 120 lb 2 oz (54.5 kg)  03/19/18 133 lb (60.3 kg)  03/15/18 133 lb (60.3 kg)    Ocular: Sclerae unicteric, pupils equal, round and reactive to light Ear-nose-throat: Oropharynx clear, dentition fair Lymphatic: No cervical or supraclavicular adenopathy Lungs no rales or rhonchi, good excursion bilaterally Heart regular rate and rhythm, no murmur appreciated Abd soft,  nontender, positive bowel sounds, no liver or spleen tip palpated on exam, no fluid wave  MSK no focal spinal tenderness, no joint edema Neuro: non-focal, well-oriented, appropriate affect Breasts: Deferred   Lab Results  Component Value Date   WBC 6.2 01/07/2019   HGB 13.4 01/07/2019   HCT 39.6 01/07/2019   MCV 101.4 (H) 01/07/2019   PLT 291.0 01/07/2019   Lab Results  Component Value Date   FERRITIN 153 01/07/2019   IRON 233 (H) 01/07/2019   TIBC 344 01/07/2019   IRONPCTSAT 68 (H)  01/07/2019   Lab Results  Component Value Date   RBC 3.90 01/07/2019   No results found for: Nils Pyle [facility] Lab Results  Component Value Date   IGA 264 12/19/2006   No results found for: Odetta Pink, SPEI   Chemistry      Component Value Date/Time   NA 137 01/07/2019 1512   K 3.9 01/07/2019 1512   CL 101 01/07/2019 1512   CO2 25 01/07/2019 1512   BUN 13 01/07/2019 1512   CREATININE 0.77 01/07/2019 1512   CREATININE 0.92 09/30/2018 0951   CREATININE 0.86 10/16/2017 1454      Component Value Date/Time   CALCIUM 9.5 01/07/2019 1512   CALCIUM 9.5 01/07/2019 1512   ALKPHOS 57 01/07/2019 1512   AST 23 01/07/2019 1512   AST 27 09/30/2018 0951   ALT 19 01/07/2019 1512   ALT 21 09/30/2018 0951   BILITOT 0.9 01/07/2019 1512   BILITOT 1.0 09/30/2018 0951       Impression and Plan: Ms. Drohan is a very pleasant 43 yo caucasian female heterozygous for the Prothrombin II gene mutation and history of bilateral pulmonary emboli. She is here today for a recent elevated iron level and iron saturation. This is unusual for her as she is normally low.  Hemochromatosis DNA is pending. We also repeated her iron studies to confirm.  Her Hgb is stable at 13.0, WBC count 6.9 and platelets 286.  We will see what her lab work up shows and go from there.  Once we have results we will contact her to go over and schedule follow-up.  She is in agreement with the plan and will contact our office with any other questions or concerns. We can certainly see her sooner if needed.    Laverna Peace, NP 9/21/20203:15 PM

## 2019-01-14 LAB — IRON AND TIBC
Iron: 100 ug/dL (ref 41–142)
Saturation Ratios: 31 % (ref 21–57)
TIBC: 328 ug/dL (ref 236–444)
UIBC: 228 ug/dL (ref 120–384)

## 2019-01-14 LAB — LACTATE DEHYDROGENASE: LDH: 141 U/L (ref 98–192)

## 2019-01-14 LAB — FERRITIN: Ferritin: 172 ng/mL (ref 11–307)

## 2019-01-15 ENCOUNTER — Telehealth: Payer: Self-pay | Admitting: Family

## 2019-01-15 NOTE — Telephone Encounter (Signed)
No los 9/21 

## 2019-01-16 ENCOUNTER — Other Ambulatory Visit: Payer: Self-pay

## 2019-01-16 ENCOUNTER — Ambulatory Visit (INDEPENDENT_AMBULATORY_CARE_PROVIDER_SITE_OTHER): Payer: BLUE CROSS/BLUE SHIELD

## 2019-01-16 DIAGNOSIS — E538 Deficiency of other specified B group vitamins: Secondary | ICD-10-CM | POA: Diagnosis not present

## 2019-01-16 MED ORDER — CYANOCOBALAMIN 1000 MCG/ML IJ SOLN
1000.0000 ug | Freq: Once | INTRAMUSCULAR | Status: AC
  Administered 2019-01-16: 1000 ug via INTRAMUSCULAR

## 2019-01-16 NOTE — Progress Notes (Addendum)
[name] [name] is a 43 y.o. female presents to the office today for B12 njections, per physician's orders.  Original order: 01/09/2019 -start B12 injections immediately.This can be provided by nurse visit for B12 injection once weekly for 4 injections, followed by every 2 weeks for 4 injections  Vitamin B12 1024mcg IM was administered left deltoid today. Patient tolerated injection. 2 of 4 once weekly injected.   Patient due for follow up labs/provider appt: Yes. Date due: 04/08/2019, appt made NO  Patient next injection due: 01/23/2019, appt made Yes  Pt with vit B12 deficiency.  Agree with vit B12 1000 mcg IM in office today. Signed:  Crissie Sickles, MD           01/22/2019

## 2019-01-17 ENCOUNTER — Other Ambulatory Visit: Payer: Self-pay

## 2019-01-17 ENCOUNTER — Telehealth: Payer: BLUE CROSS/BLUE SHIELD | Admitting: Family

## 2019-01-17 DIAGNOSIS — Z20822 Contact with and (suspected) exposure to covid-19: Secondary | ICD-10-CM

## 2019-01-17 MED ORDER — BENZONATATE 100 MG PO CAPS: 100.0000 mg | ORAL_CAPSULE | Freq: Three times a day (TID) | ORAL | 0 refills | Status: DC | PRN

## 2019-01-17 NOTE — Progress Notes (Signed)
E-Visit for Corona Virus Screening   Your current symptoms could be consistent with the coronavirus.  Many health care providers can now test patients at their office but not all are.  [facility] has multiple testing sites. For information on our COVID testing locations and hours go to [email or web address]  Please quarantine yourself while awaiting your test results.  We are enrolling you in our MyChart Home Montioring for COVID19 . Daily you will receive a questionnaire within the MyChart website. Our COVID 19 response team willl be monitoriing your responses daily.  Approximately 5 minutes was spent documenting and reviewing patient's chart.   You can go to one of the  testing sites listed below, while they are opened (see hours). You do not need an order and will stay in your car during the test. You do need to self isolate until your results return and if positive 14 days from when your symptoms started and until you are 3 days symptom free.   Testing Locations (Monday - Friday, 8 a.m. - 3:30 p.m.) . [place]: [facility], [address], [place], [place]  . [place]: [facility], [address], [place], [place] (entrance off [address])  . [place]: (Closed each Monday): Testing site relocated to the short stay covered drive at [facility]. (Use the [address] entrance to [facility] next to [facility].)   COVID-19 is a respiratory illness with symptoms that are similar to the flu. Symptoms are typically mild to moderate, but there have been cases of severe illness and death due to the virus. The following symptoms may appear 2-14 days after exposure: . Fever . Cough . Shortness of breath or difficulty breathing . Chills . Repeated shaking with chills . Muscle pain . Headache . Sore throat . New loss of taste or smell . Fatigue . Congestion or runny  nose . Nausea or vomiting . Diarrhea  It is vitally important that if you feel that you have an infection such as this virus or any other virus that you stay home and away from places where you may spread it to others.  You should self-quarantine for 14 days if you have symptoms that could potentially be coronavirus or have been in close contact a with a person diagnosed with COVID-19 within the last 2 weeks. You should avoid contact with people age 65 and older.   You should wear a mask or cloth face covering over your nose and mouth if you must be around other people or animals, including pets (even at home). Try to stay at least 6 feet away from other people. This will protect the people around you.  You can use medication such as A prescription cough medication called Tessalon Perles 100 mg. You may take 1-2 capsules every 8 hours as needed for cough  You may also take acetaminophen (Tylenol) as needed for fever.   Reduce your risk of any infection by using the same precautions used for avoiding the common cold or flu:  . Wash your hands often with soap and warm water for at least 20 seconds.  If soap and water are not readily available, use an alcohol-based hand sanitizer with at least 60% alcohol.  . If coughing or sneezing, cover your mouth and nose by coughing or sneezing into the elbow areas of your shirt or coat, into a tissue or into your sleeve (not your hands). . Avoid shaking hands with   others and consider head nods or verbal greetings only. . Avoid touching your eyes, nose, or mouth with unwashed hands.  . Avoid close contact with people who are sick. . Avoid places or events with large numbers of people in one location, like concerts or sporting events. . Carefully consider travel plans you have or are making. . If you are planning any travel outside or inside the [place], visit the CDC's Travelers' Health webpage for the latest health notices. . If you have some symptoms but not all  symptoms, continue to monitor at home and seek medical attention if your symptoms worsen. . If you are having a medical emergency, call 911.  HOME CARE . Only take medications as instructed by your medical team. . Drink plenty of fluids and get plenty of rest. . A steam or ultrasonic humidifier can help if you have congestion.   GET HELP RIGHT AWAY IF YOU HAVE EMERGENCY WARNING SIGNS** FOR COVID-19. If you or someone is showing any of these signs seek emergency medical care immediately. Call 911 or proceed to your closest emergency facility if: . You develop worsening high fever. . Trouble breathing . Bluish lips or face . Persistent pain or pressure in the chest . New confusion . Inability to wake or stay awake . You cough up blood. . Your symptoms become more severe  **This list is not all possible symptoms. Contact your medical provider for any symptoms that are sever or concerning to you.   MAKE SURE YOU   Understand these instructions.  Will watch your condition.  Will get help right away if you are not doing well or get worse.  Your e-visit answers were reviewed by a board certified advanced clinical practitioner to complete your personal care plan.  Depending on the condition, your plan could have included both over the counter or prescription medications.  If there is a problem please reply once you have received a response from your provider.  Your safety is important to [place].  If you have drug allergies check your prescription carefully.    You can use MyChart to ask questions about today's visit, request a non-urgent call back, or ask for a work or school excuse for 24 hours related to this e-Visit. If it has been greater than 24 hours you will need to follow up with your provider, or enter a new e-Visit to address those concerns. You will get an e-mail in the next two days asking about your experience.  I hope that your e-visit has been valuable and will speed your  recovery. Thank you for using e-visits.

## 2019-01-18 ENCOUNTER — Encounter (INDEPENDENT_AMBULATORY_CARE_PROVIDER_SITE_OTHER): Payer: Self-pay

## 2019-01-18 LAB — NOVEL CORONAVIRUS, NAA: SARS-CoV-2, NAA: NOT DETECTED

## 2019-01-20 LAB — HEMOCHROMATOSIS DNA-PCR(C282Y,H63D)

## 2019-01-21 ENCOUNTER — Other Ambulatory Visit: Payer: Self-pay

## 2019-01-21 ENCOUNTER — Ambulatory Visit (HOSPITAL_BASED_OUTPATIENT_CLINIC_OR_DEPARTMENT_OTHER)
Admission: RE | Admit: 2019-01-21 | Discharge: 2019-01-21 | Disposition: A | Discharge status: Home / Self Care | Payer: BLUE CROSS/BLUE SHIELD | Source: Ambulatory Visit | Attending: Family | Admitting: Family

## 2019-01-21 ENCOUNTER — Telehealth: Payer: Self-pay | Admitting: Family

## 2019-01-21 ENCOUNTER — Other Ambulatory Visit: Payer: Self-pay | Admitting: Family

## 2019-01-21 DIAGNOSIS — R0602 Shortness of breath: Secondary | ICD-10-CM | POA: Insufficient documentation

## 2019-01-21 MED ORDER — IOHEXOL 350 MG/ML SOLN
100.0000 mL | Freq: Once | INTRAVENOUS | Status: AC | PRN
  Administered 2019-01-21: 16:00:00 100 mL via INTRAVENOUS

## 2019-01-21 NOTE — Telephone Encounter (Signed)
Left message with call back number to go over lab findings.

## 2019-01-21 NOTE — Telephone Encounter (Signed)
I spoke with patient and went over recent lab results including iron studies and new diagnosis of hemochromatosis.  No intervention needed at this time for the iron levels. Her saturation is much improved at 31%.  She is still having a lot of issues. She states that she has history of palpitations and SVT but feels that her episodes are becoming more frequent and she also recently had an episode of chest pain. I suggested she contact her PCP and request a Holter monitor. A cardiology consult would also be helpful.  She also has SOB with these episodes as well as when she is talking similar to when she was diagnosed with PE's. We will get a CT angio to further evaluate for recurrence. She is currently on a maintenance dose of Eliquis 2.5 mg BID when she remembers to take.  She is still very fatigued. She states that her joint pain seems a little better.

## 2019-01-22 ENCOUNTER — Telehealth: Payer: Self-pay | Admitting: *Deleted

## 2019-01-22 NOTE — Telephone Encounter (Signed)
Patient notified per order of S. Dubois NP that there is "no PE and to follow up with PCP regarding a cardiology referral if palpitations and chest pain continue."  Pt appreciative of call and has no questions or concerns at this time.

## 2019-01-22 NOTE — Telephone Encounter (Signed)
-----   Message from Eliezer Bottom, NP sent at 01/22/2019  9:01 AM EDT ----- No PE!!!! Margit Banda!!!! Follow-up with PCP regarding a cardiology referral if palpitations and chest pain continue!   Sarah    ----- Message ----- From: Buel Ream, Rad Results In Sent: 01/21/2019   4:33 PM EDT To: Eliezer Bottom, NP

## 2019-01-23 ENCOUNTER — Ambulatory Visit (INDEPENDENT_AMBULATORY_CARE_PROVIDER_SITE_OTHER): Payer: BLUE CROSS/BLUE SHIELD | Admitting: Family Medicine

## 2019-01-23 ENCOUNTER — Ambulatory Visit: Payer: BLUE CROSS/BLUE SHIELD

## 2019-01-23 ENCOUNTER — Other Ambulatory Visit: Payer: Self-pay

## 2019-01-23 DIAGNOSIS — E538 Deficiency of other specified B group vitamins: Secondary | ICD-10-CM

## 2019-01-23 MED ORDER — CYANOCOBALAMIN 1000 MCG/ML IJ SOLN
1000.0000 ug | Freq: Once | INTRAMUSCULAR | Status: AC
  Administered 2019-01-23: 1000 ug via INTRAMUSCULAR

## 2019-01-23 NOTE — Progress Notes (Addendum)
[name] H Donlonis a 43 y.o.femalepresents to the office today for B12njections, per physician's orders.  Original order:01/09/2019 -start B12 injections immediately.This can be provided by nurse visit for B12 injection once weekly for 4 injections, followed by every 2 weeks for 4 injections  Vitamin B12 1063mcg IMwas administered left deltoidtoday. Patient tolerated injection. 3 of 4 once weekly injected.   Patient due for follow up labs/provider appt:Yes. Date due:04/08/2019, appt madeNO  Patient next injection due:01/30/2019, appt Barney Drain, [place]  Electronically Signed by: Howard Pouch, DO [facility] [place]

## 2019-01-30 ENCOUNTER — Other Ambulatory Visit: Payer: Self-pay

## 2019-01-30 ENCOUNTER — Ambulatory Visit (INDEPENDENT_AMBULATORY_CARE_PROVIDER_SITE_OTHER): Payer: BLUE CROSS/BLUE SHIELD

## 2019-01-30 DIAGNOSIS — E538 Deficiency of other specified B group vitamins: Secondary | ICD-10-CM

## 2019-01-30 MED ORDER — CYANOCOBALAMIN 1000 MCG/ML IJ SOLN
1000.0000 ug | Freq: Once | INTRAMUSCULAR | Status: AC
  Administered 2019-01-30: 1000 ug via INTRAMUSCULAR

## 2019-01-30 NOTE — Progress Notes (Addendum)
[name] H Donlonis a 43 y.o.femalepresents to the office today for B12injections, per physician's orders.  Original order:01/09/2019 -start B12 injections immediately.This can be provided by nurse visit for B12 injection once weekly for 4 injections, followed by every 2 weeks for 4 injections  Vitamin B12 1061mcg IMwas administeredleftdeltoidtoday. Patient tolerated injection.4 of 4 once weekly completed at this nurse visit.   Patient due for follow up labs/provider appt:Yes. Date due: 02/13/2019, appt [facility]  Patient next injection due:02/13/2019, appt [name] Foil LPN   ADDENDUM: Pt with vit B12 deficiency.  Agree with vit B12 1000 mcg IM in office today. Signed:  Crissie Sickles, MD           02/13/2019

## 2019-02-04 ENCOUNTER — Ambulatory Visit: Payer: BLUE CROSS/BLUE SHIELD | Admitting: Family Medicine

## 2019-02-18 NOTE — Progress Notes (Signed)
Pt with vit B12 deficiency.  Agree with vit B12 1000 mcg IM in office today. Signed:  Crissie Sickles, MD           02/18/2019

## 2019-02-24 ENCOUNTER — Other Ambulatory Visit: Payer: Self-pay

## 2019-02-24 ENCOUNTER — Ambulatory Visit (INDEPENDENT_AMBULATORY_CARE_PROVIDER_SITE_OTHER): Payer: BLUE CROSS/BLUE SHIELD | Admitting: Family Medicine

## 2019-02-24 DIAGNOSIS — E538 Deficiency of other specified B group vitamins: Secondary | ICD-10-CM

## 2019-02-24 MED ORDER — CYANOCOBALAMIN 1000 MCG/ML IJ SOLN
1000.0000 ug | Freq: Once | INTRAMUSCULAR | Status: AC
  Administered 2019-02-24: 1000 ug via INTRAMUSCULAR

## 2019-02-24 NOTE — Progress Notes (Addendum)
[name] H Donlonis a 43 y.o.femalepresents to the office today for B12njections, per physician's orders.  Original order:01/09/2019 -start B12 injections immediately.This can be provided by nurse visit for B12 injection once weekly for 4 injections, followed by every 2 weeks for 4 injections  Vitamin B12 1085mcg IMwas administeredright deltoidtoday. Patient tolerated injection.   Patient now doing bi-weekly injections, missed her first one due to being out of town.  1 of 4 Bi-weekly   Patient due for follow up labs/provider appt:Yes. Date due:04/08/2019, appt madeNO  Patient next injection due:03/10/2019, appt Barney Drain, [place] screening examination/treatment/procedure(s) were performed by non-physician practitioner and as supervising physician I was immediately available for consultation/collaboration.  I agree with above assessment and plan.  Electronically Signed by: Howard Pouch, DO [facility] [place]

## 2019-03-13 ENCOUNTER — Ambulatory Visit (INDEPENDENT_AMBULATORY_CARE_PROVIDER_SITE_OTHER): Payer: BLUE CROSS/BLUE SHIELD

## 2019-03-13 ENCOUNTER — Other Ambulatory Visit: Payer: Self-pay

## 2019-03-13 DIAGNOSIS — E538 Deficiency of other specified B group vitamins: Secondary | ICD-10-CM

## 2019-03-13 MED ORDER — CYANOCOBALAMIN 1000 MCG/ML IJ SOLN
1000.0000 ug | Freq: Once | INTRAMUSCULAR | Status: AC
  Administered 2019-03-13: 1000 ug via INTRAMUSCULAR

## 2019-03-13 NOTE — Progress Notes (Addendum)
[name] H Donlonis a 43 y.o.femalepresents to the office today for B12njections, per physician's orders.  Original order:01/09/2019 -start B12 injections immediately.This can be provided by nurse visit for B12 injection once weekly for 4 injections, followed by every 2 weeks for 4 injections  Vitamin B12 1069mcg IMwas administeredleft deltoidtoday. Patient tolerated injection.   Patient now doing bi-weekly injections, missed her first one due to being out of town.  2 of 4 Bi-weekly    Patient due for follow up labs/provider appt:Yes. Date due:04/08/2019, appt madeNO  Patient next injection due:03/27/2019, appt [facility]

## 2019-04-01 ENCOUNTER — Other Ambulatory Visit: Payer: Self-pay | Admitting: Family

## 2019-04-01 ENCOUNTER — Inpatient Hospital Stay: Payer: BLUE CROSS/BLUE SHIELD

## 2019-04-01 ENCOUNTER — Inpatient Hospital Stay: Payer: BLUE CROSS/BLUE SHIELD | Admitting: Hematology & Oncology

## 2019-04-01 DIAGNOSIS — D6852 Prothrombin gene mutation: Secondary | ICD-10-CM

## 2019-04-01 DIAGNOSIS — I2699 Other pulmonary embolism without acute cor pulmonale: Secondary | ICD-10-CM

## 2019-04-02 ENCOUNTER — Other Ambulatory Visit: Payer: Self-pay

## 2019-04-02 ENCOUNTER — Inpatient Hospital Stay: Payer: BLUE CROSS/BLUE SHIELD | Attending: Family

## 2019-04-02 DIAGNOSIS — I2699 Other pulmonary embolism without acute cor pulmonale: Secondary | ICD-10-CM | POA: Diagnosis present

## 2019-04-02 DIAGNOSIS — Z7901 Long term (current) use of anticoagulants: Secondary | ICD-10-CM | POA: Diagnosis not present

## 2019-04-02 DIAGNOSIS — D6852 Prothrombin gene mutation: Secondary | ICD-10-CM

## 2019-04-02 DIAGNOSIS — Z79899 Other long term (current) drug therapy: Secondary | ICD-10-CM | POA: Diagnosis not present

## 2019-04-02 DIAGNOSIS — R002 Palpitations: Secondary | ICD-10-CM | POA: Insufficient documentation

## 2019-04-02 LAB — CBC WITH DIFFERENTIAL (CANCER CENTER ONLY)
Abs Immature Granulocytes: 0.02 10*3/uL (ref 0.00–0.07)
Basophils Absolute: 0 10*3/uL (ref 0.0–0.1)
Basophils Relative: 0 %
Eosinophils Absolute: 0 10*3/uL (ref 0.0–0.5)
Eosinophils Relative: 0 %
HCT: 39.7 % (ref 36.0–46.0)
Hemoglobin: 13.8 g/dL (ref 12.0–15.0)
Immature Granulocytes: 0 %
Lymphocytes Relative: 27 %
Lymphs Abs: 2 10*3/uL (ref 0.7–4.0)
MCH: 33.9 pg (ref 26.0–34.0)
MCHC: 34.8 g/dL (ref 30.0–36.0)
MCV: 97.5 fL (ref 80.0–100.0)
Monocytes Absolute: 0.4 10*3/uL (ref 0.1–1.0)
Monocytes Relative: 5 %
Neutro Abs: 5 10*3/uL (ref 1.7–7.7)
Neutrophils Relative %: 68 %
Platelet Count: 281 10*3/uL (ref 150–400)
RBC: 4.07 MIL/uL (ref 3.87–5.11)
RDW: 11.3 % — ABNORMAL LOW (ref 11.5–15.5)
WBC Count: 7.4 10*3/uL (ref 4.0–10.5)
nRBC: 0 % (ref 0.0–0.2)

## 2019-04-02 LAB — CMP (CANCER CENTER ONLY)
ALT: 12 U/L (ref 0–44)
AST: 17 U/L (ref 15–41)
Albumin: 4.8 g/dL (ref 3.5–5.0)
Alkaline Phosphatase: 54 U/L (ref 38–126)
Anion gap: 10 (ref 5–15)
BUN: 11 mg/dL (ref 6–20)
CO2: 25 mmol/L (ref 22–32)
Calcium: 9.3 mg/dL (ref 8.9–10.3)
Chloride: 104 mmol/L (ref 98–111)
Creatinine: 0.8 mg/dL (ref 0.44–1.00)
GFR, Est AFR Am: >60 mL/min (ref >60)
GFR, Estimated: >60 mL/min (ref >60)
Glucose, Bld: 84 mg/dL (ref 70–99)
Potassium: 3.7 mmol/L (ref 3.5–5.1)
Sodium: 139 mmol/L (ref 135–145)
Total Bilirubin: 0.4 mg/dL (ref 0.3–1.2)
Total Protein: 8 g/dL (ref 6.5–8.1)

## 2019-04-02 LAB — D-DIMER, QUANTITATIVE: D-Dimer, Quant: <0.27 ug/mL-FEU (ref 0.00–0.50)

## 2019-04-03 ENCOUNTER — Encounter: Payer: Self-pay | Admitting: Hematology & Oncology

## 2019-04-03 ENCOUNTER — Encounter: Payer: Self-pay | Admitting: Neurology

## 2019-04-03 ENCOUNTER — Inpatient Hospital Stay (HOSPITAL_BASED_OUTPATIENT_CLINIC_OR_DEPARTMENT_OTHER): Payer: BLUE CROSS/BLUE SHIELD | Admitting: Hematology & Oncology

## 2019-04-03 DIAGNOSIS — G35 Multiple sclerosis: Secondary | ICD-10-CM | POA: Diagnosis not present

## 2019-04-03 HISTORY — DX: Hereditary hemochromatosis: E83.110

## 2019-04-03 LAB — IRON AND TIBC
Iron: 93 ug/dL (ref 41–142)
Saturation Ratios: 25 % (ref 21–57)
TIBC: 370 ug/dL (ref 236–444)
UIBC: 277 ug/dL (ref 120–384)

## 2019-04-03 LAB — FERRITIN: Ferritin: 208 ng/mL (ref 11–307)

## 2019-04-03 NOTE — Progress Notes (Signed)
Hematology and Oncology Follow Up Visit  [name] [name] [id number] [birth year] 43 y.o. 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 twice daily-to complete 1 year of therapeutic dosing in July 2020  Eliquis 2.5 mg po BID -- maintenance  Phlebotomy to keep Ferritin < 100 and %Sat < 30   Interim History:  [name] [name] is doing a telephone visit with me today.  This is because of the coronavirus.   Surprisingly, we found that she does have hemochromatosis.  We did go ahead and do a hemochromatosis assay on her when she was here back in September.  We found that she is heterozygous for one of the more minor mutations- H63D.  She has never been phlebotomized.  Para she does not have a monthly cycle.  She says she does not have a monthly cycle for quite a while.  She had lab work done yesterday.  Her ferritin was 208 with an iron saturation of 25%.  We will set her up with a phlebotomy for this.  Her biggest concern however is this intermittent numbness that she has in her hands.  She has some weakness as on occasion.  There is a family history of multiple sclerosis.  She also has palpitations.  They really are affecting her.  She used to be a runner but really cannot do this any longer.  I think she is going to need an MRI of the brain.  I also get an MRI of the cervical spine to make sure that we do not overlook any type of lesions that might contribute to the symptoms.  Again, she has a family history of multiple sclerosis.  She did have some rheumatologic studies done.  They all appear to be unremarkable.  She has had no fever.  She is is eating okay.  She is not a vegetarian.  She was told that she had low B12 level.  I saw that a B12 level in September was 248.  This really is not all that bad.  However,  her doctor wants her on B12 injections.  She has had no obvious change in bowel or bladder habits.  She has had no bleeding.  She is on Eliquis.  She had a CT angiogram done back in September that did not show any residual pulmonary emboli.  She is on a maintenance dose of Eliquis.  Overall, her performance status sounds like ECOG 1.  Medications:  Allergies as of 04/03/2019      Reactions   Sulfa Antibiotics Anaphylaxis, Hives   Morphine Other (See Comments)   aggressiveness   Morphine And Related Itching, Other (See Comments)   hallucinations   Erythromycin Other (See Comments)   Unknown reaction from childhood      Medication List       Accurate as of April 03, 2019  1:09 PM. If you have any questions, ask your nurse or doctor.        apixaban 2.5 MG Tabs tablet Commonly known as: Eliquis Take 1 tablet (2.5 mg total) by mouth 2 (two) times daily.   benzonatate 100 MG capsule Commonly known as: Tessalon Perles Take 1 capsule (100 mg total) by mouth 3 (three) times daily as needed.   nitrofurantoin (macrocrystal-monohydrate) 100 MG capsule Commonly known as: MACROBID TAKE 1 CAPSULE (100 MG TOTAL) BY MOUTH AT BEDTIME.   Vyvanse 60 MG capsule  Generic drug: lisdexamfetamine Take 60 mg by mouth every morning.       Allergies:  Allergies  Allergen Reactions  . Sulfa Antibiotics Anaphylaxis and Hives  . Morphine Other (See Comments)    aggressiveness  . Morphine And Related Itching and Other (See Comments)    hallucinations  . Erythromycin Other (See Comments)    Unknown reaction from childhood    Past Medical History, Surgical history, Social history, and Family History were reviewed and updated.  Review of Systems: Review of Systems  Constitutional: Positive for malaise/fatigue.  Eyes: Negative.   Respiratory: Positive for shortness of breath.   Cardiovascular: Positive for palpitations.  Gastrointestinal: Negative.   Genitourinary: Negative.    Musculoskeletal: Positive for joint pain.  Skin: Negative.   Neurological: Positive for tingling and focal weakness.  Endo/Heme/Allergies: Negative.   Psychiatric/Behavioral: Negative.      Physical Exam:  vitals were not taken for this visit.   Wt Readings from Last 3 Encounters:  01/13/19 120 lb (54.4 kg)  01/07/19 120 lb 2 oz (54.5 kg)  03/19/18 133 lb (60.3 kg)    This was a virtual visit by telephone.  As such a physical exam was not done.  Lab Results  Component Value Date   WBC 7.4 04/02/2019   HGB 13.8 04/02/2019   HCT 39.7 04/02/2019   MCV 97.5 04/02/2019   PLT 281 04/02/2019   Lab Results  Component Value Date   FERRITIN 208 04/02/2019   IRON 93 04/02/2019   TIBC 370 04/02/2019   UIBC 277 04/02/2019   IRONPCTSAT 25 04/02/2019   Lab Results  Component Value Date   RETICCTPCT 1.9 01/13/2019   RBC 4.07 04/02/2019   No results found for: KPAFRELGTCHN, LAMBDASER, [facility] Lab Results  Component Value Date   IGA 264 12/19/2006   No results found for: Marda Stalker, SPEI   Chemistry      Component Value Date/Time   NA 139 04/02/2019 1358   K 3.7 04/02/2019 1358   CL 104 04/02/2019 1358   CO2 25 04/02/2019 1358   BUN 11 04/02/2019 1358   CREATININE 0.80 04/02/2019 1358   CREATININE 0.86 10/16/2017 1454      Component Value Date/Time   CALCIUM 9.3 04/02/2019 1358   ALKPHOS 54 04/02/2019 1358   AST 17 04/02/2019 1358   ALT 12 04/02/2019 1358   BILITOT 0.4 04/02/2019 1358       Impression and Plan: [name] [name] is a very pleasant 43 yo caucasian female heterozygous for the Prothrombin II gene mutation and history of bilateral pulmonary emboli.  She also has hemochromatosis    Again, it seems like the main problem that we have right now is this neurological situation.  I am not sure exactly what might be going on.  We will have to set her up with MRIs.  Again I think she is bargaining to see a  neurologist.  Ultimately, she may need a spinal tap to make a diagnosis of multiple sclerosis if the MRI is unremarkable.  We will also make referral to cardiology for the complications.  We will see about getting her phlebotomized.  We do not have to see her back in the office probably within a month or so given all of her issues.  I just want to make sure that we try to focus on her quality of life and try to get that better.   Josph Macho, MD 12/10/20201:09 PM

## 2019-04-04 ENCOUNTER — Telehealth: Payer: Self-pay

## 2019-04-04 NOTE — Telephone Encounter (Signed)
Spoke with the pt and per Dr. Acie Fredrickson and per Dr. Antonieta Pert request, Dr. Acie Fredrickson can see her in the office 04/07/19 at 7:40am.  She will be traveling 12/16-12/20 so she was not able to accept any of the other times offered by Dr. Acie Fredrickson to see her when I called her yesterday 04/03/19.    DX: palpitations       COVID-19 Pre-Screening Questions:  . In the past 7 to 10 days have you had a cough,  shortness of breath, headache, congestion, fever (100 or greater) body aches, chills, sore throat, or sudden loss of taste or sense of smell? NO  . Have you been around anyone with known Covid 19. NO . Have you been around anyone who is awaiting Covid 19 test results in the past 7 to 10 days? NO . Have you been around anyone who has been exposed to Covid 19, or has mentioned symptoms of Covid 19 within the past 7 to 10 days? NO  If you have any concerns/questions about symptoms patients report during screening (either on the phone or at threshold). Contact the provider seeing the patient or DOD for further guidance.  If neither are available contact a member of the leadership team.

## 2019-04-04 NOTE — Telephone Encounter (Deleted)
-----   Message from Thayer Headings, MD sent at 04/03/2019  5:45 PM EST ----- I can see her at 7:40 on Monday am ( dec. 14)  ----- Message ----- From: Stephani Police, RN Sent: 04/03/2019   3:04 PM EST To: Thayer Headings, MD, Emmaline Life, RN  Hey Dr. Acie Fredrickson, I called the pt but she is going to be out of town 12/16-12/21. I am sorry, do you have any other suggestions.. I looked at your other days and they are full but can work her in if you want.  Donnella Morford  ----- Message ----- From: Thayer Headings, MD Sent: 04/03/2019   2:36 PM EST To: [name], MD, Emmaline Life, RN, #  Christianne Borrow,  We will get her worked in soon.  Byran Bilotti, Will you see if she can come in Dec. 17 at 10 am.  Alternatively , I could see her on my DOD work in [place] on Dec. 18.  Thanks [place] ----- Message ----- From: [name], MD Sent: 04/03/2019   2:17 PM EST To: Thayer Headings, MD  Phil:  Mikki Santee!!  Can you please try to help my patient out.  She has a lot of palpitations that are affecting her quality of life.  She has a mild form of hemochromatosis that is well controlled.  I know that you will do a great job!! Thanks so much!!  [employer]

## 2019-04-06 ENCOUNTER — Encounter: Payer: Self-pay | Admitting: Cardiovascular Disease

## 2019-04-06 NOTE — Progress Notes (Signed)
Cardiology Office Note:    Date:  04/07/2019   ID:  [name] [name], DOB Aug 29, 1975, MRN [id number]  PCP:  Natalia Leatherwood, DO  Cardiologist:  Lani Havlik  Electrophysiologist:  None   Referring MD: Natalia Leatherwood, DO   Chief Complaint  Patient presents with  . Palpitations    History of Present Illness:    [name] [name] is a 43 y.o. female with a hx of pulmonary emboli, hemachromatosis,  and palpitations.  We were asked by dr. Myna Hidalgo to see [name] [name] for worsening palpitations.    He large bilateral pulmonary emboi in July 2019 . Had severe pleuretic cp,  Cough Seemed to be related to estrogen.  But its unclear Followed by [facility],    Has had plapitations off [name] [name] life,  Worse when she was pregnant.   Episodes of palpitations start with numbness, tingling in [name] [name] hands and feet .  HR was sustained at 120-130 for a day   Also associated with some chest pain  Intermittant.    Random.   May not have them for a month   Past Medical History:  Diagnosis Date  . AMA (advanced maternal age) multigravida 35+   . Arrhythmia    Mobitz type 1  . Chicken pox   . Depression   . Depression with anxiety   . Frequent headaches   . Heart murmur    as a child  . Hemochromatosis, 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/2019  . Tachycardia    as a child  . Thyroid disease   . Urinary incontinence     Past Surgical History:  Procedure Laterality Date  . ABDOMINOPLASTY    . BREAST ENHANCEMENT SURGERY    . CESAREAN SECTION    . CESAREAN SECTION Bilateral 11/01/2016   Procedure: CESAREAN SECTION;  Surgeon: Levi Aland, MD;  Location: [facility] BIRTHING SUIT[facility];  Service: Obstetrics;  Laterality: Bilateral;  . KIDNEY SURGERY     congenital abnormality  of ureters  . MANDIBLE SURGERY    . TONSILLECTOMY      Current Medications: Current Meds  Medication Sig  . amphetamine-dextroamphetamine (ADDERALL) 20 MG tablet Take 20 mg by mouth daily as needed.  Marland Kitchen apixaban (ELIQUIS) 2.5 MG TABS tablet Take 1 tablet (2.5  mg total) by mouth 2 (two) times daily.  . nitrofurantoin, macrocrystal-monohydrate, (MACROBID) 100 MG capsule Take 100 mg by mouth as needed.  Marland Kitchen VYVANSE 70 MG capsule Take 70 mg by mouth daily.     Allergies:   Sulfa antibiotics, Morphine, Morphine and related, and Erythromycin   Social History   Socioeconomic History  . Marital status: Married    Spouse name: Not on file  . Number of children: Not on file  . Years of education: Not on file  . Highest education level: Not on file  Occupational History  . Not on file  Tobacco Use  . Smoking status: Former Games developer  . Smokeless tobacco: Never Used  Substance and Sexual Activity  . Alcohol use: Yes  . Drug use: No  . Sexual activity: Yes    Partners: Male    Birth control/protection: None  Other Topics Concern  . Not on file  Social History Narrative   Married, college-educated.  Works in Airline pilot.  4 children.   Social drinker.  Former smoker.   Exercises 3 times a week.   Smoke alarm in the home.  Wears [name] [name] seatbelt.   Feels safe in [name] [name] relationships.   Social Determinants of Health  Financial Resource Strain:   . Difficulty of Paying Living Expenses: Not on file  Food Insecurity:   . Worried About Charity fundraiser in the Last Year: Not on file  . Ran Out of Food in the Last Year: Not on file  Transportation Needs:   . Lack of Transportation (Medical): Not on file  . Lack of Transportation (Non-Medical): Not on file  Physical Activity:   . Days of Exercise per Week: Not on file  . Minutes of Exercise per Session: Not on file  Stress:   . Feeling of Stress : Not on file  Social Connections:   . Frequency of Communication with Friends and Family: Not on file  . Frequency of Social Gatherings with Friends and Family: Not on file  . Attends Religious Services: Not on file  . Active Member of Clubs or Organizations: Not on file  . Attends Archivist Meetings: Not on file  . Marital Status: Not on file      Family History: The patient's family history includes Alcohol abuse in [name] [name] father and paternal grandfather; Arthritis in [name] [name] father and mother; Asthma in [name] [name] brother, paternal grandfather, and son; Breast cancer in [name] [name] paternal grandmother; Cancer in [name] [name] paternal aunt and paternal grandmother; Depression in [name] [name] brother, father, maternal grandmother, mother, and paternal grandfather; Diabetes in [name] [name] maternal grandmother and mother; Drug abuse in [name] [name] father and paternal grandfather; Hearing loss in [name] [name] father and paternal grandfather; Heart disease in [name] [name] maternal grandfather and paternal grandfather; Hyperlipidemia in [name] [name] father and paternal grandfather; Hypertension in [name] [name] father; Kidney disease in [name] [name] maternal grandfather; Mental illness in [name] [name] mother, paternal grandfather, and sister; Miscarriages / Stillbirths in [name] [name] mother; Ovarian cancer in [name] [name] paternal grandmother; Stroke in [name] [name] maternal grandfather and mother.  ROS:   Please see the history of present illness.     All other systems reviewed and are negative.  EKGs/Labs/Other Studies Reviewed:    The following studies were reviewed today:   EKG:  Dec. 14, 2020: Normal sinus rhythm at 79 bpm.  No ST or T wave changes.   Recent Labs: 01/07/2019: Magnesium 2.0; TSH 0.87 04/02/2019: ALT 12; BUN 11; Creatinine 0.80; Hemoglobin 13.8; Platelet Count 281; Potassium 3.7; Sodium 139  Recent Lipid Panel No results found for: CHOL, TRIG, HDL, CHOLHDL, VLDL, LDLCALC, LDLDIRECT  Physical Exam:    VS:  BP 122/78   Pulse 81   Ht 5' 3.5" (1.613 m)   Wt 125 lb 6.4 oz (56.9 kg)   SpO2 99%   BMI 21.87 kg/m     Wt Readings from Last 3 Encounters:  04/07/19 125 lb 6.4 oz (56.9 kg)  01/13/19 120 lb (54.4 kg)  01/07/19 120 lb 2 oz (54.5 kg)     GEN:  Well nourished, young female,  well developed in no acute distress HEENT: Normal NECK: No JVD; No carotid bruits LYMPHATICS: No lymphadenopathy CARDIAC: RRR, no murmurs, rubs, gallops RESPIRATORY:   Clear to auscultation without rales, wheezing or rhonchi  ABDOMEN: Palpable midline abdominal aorta. MUSCULOSKELETAL:  No edema; No deformity  SKIN: Warm and dry NEUROLOGIC:  Alert and oriented x 3 PSYCHIATRIC:  Normal affect   ASSESSMENT:    1. Palpitations    PLAN:    In order of problems listed above:  1. Palpitations: [name] [name] presents with episodes of palpitations.  These seem to be associated with a neurologic event.  She describes weakness and numbness and tingling in [name] [name] feet and hands.  These quite  often will lead to some palpitations.  These palpitations can last off and on for as little as a few minutes but might last for several hours.  They are associated with some chest pains.  Clinically some of these sound like they may be premature ventricular contractions.  She also has episodes of sinus tachycardia with heart rates into the 140 range that seem to occur spontaneously or while she is sitting on the couch.  She had an echocardiogram last year which reveals normal left ventricular systolic function.  She had trivial tricuspid regurgitation.  We will place a 30-day event monitor on [name] [name].  I have encouraged [name] [name] to continue to record heart rate readings on [name] [name] apple watch.  We will increase [name] [name] intake of potassium and sodium by encouraging [name] [name] [name] [name] to drink V8 juice every day. I will see [name] [name] again in 3 months for follow-up visit.  2.  History of pulmonary emboli: She is on Eliquis 2.5 mg twice a day for DVT prophylaxis.  3.  Hand tingling and numbness: She is being worked up for [employer].  I have asked [name] [name] to let me know if they make any firm neurologic diagnosis that may help [name] [name] sort out these cardiac arrhythmias.   Medication Adjustments/Labs and Tests Ordered: Current medicines are reviewed at length with the patient today.  Concerns regarding medicines are outlined above.  Orders Placed This Encounter  Procedures  . Cardiac event monitor  . EKG 12-Lead   No orders of the  defined types were placed in this encounter.   Patient Instructions  Medication Instructions:  Your physician recommends that you continue on your current medications as directed. Please refer to the Current Medication list given to you today.  *If you need a refill on your cardiac medications before your next appointment, please call your pharmacy*  Lab Work: None Ordered   Testing/Procedures: Your physician has recommended that you wear an event monitor. Event monitors are medical devices that record the heart's electrical activity. Doctors most often [name] [name] these monitors to diagnose arrhythmias. Arrhythmias are problems with the speed or rhythm of the heartbeat. The monitor is a small, portable device. You can wear one while you do your normal daily activities. This is usually used to diagnose what is causing palpitations/syncope (passing out). **We will call you to inform you that the monitor is being mailed to you   Follow-Up: At [facility], you and your health needs are our priority.  As part of our continuing mission to provide you with exceptional heart care, we have created designated Provider Care Teams.  These Care Teams include your primary Cardiologist (physician) and Advanced Practice Providers (APPs -  Physician Assistants and Nurse Practitioners) who all work together to provide you with the care you need, when you need it.  Your next appointment:   3 month(s) on March 11  The format for your next appointment:   In Person  Provider:   You may see Dr. Elease HashimotoNahser or one of the following Advanced Practice Providers on your designated Care Team:    Tereso NewcomerScott Weaver, PA-C  Vin NormandyBhagat, [place] JerseyPA-C  Berton BonJanine Hammond, NP  Increase your intake of fluids (water with electrolyte tabs like Nun tablets, or gatorade) , protein ( hard boiled eggs, chicken, fish) , and a electrolytes ( V-8 juice, salt, potassium chloride  which is sold as No-Salt      Signed, Kristeen MissPhilip Catharina Pica, MD  04/07/2019  8:51 AM    [facility]

## 2019-04-07 ENCOUNTER — Ambulatory Visit: Payer: BLUE CROSS/BLUE SHIELD | Admitting: Cardiovascular Disease

## 2019-04-07 ENCOUNTER — Other Ambulatory Visit: Payer: Self-pay

## 2019-04-07 ENCOUNTER — Telehealth: Payer: Self-pay | Admitting: *Deleted

## 2019-04-07 ENCOUNTER — Encounter: Payer: Self-pay | Admitting: Cardiovascular Disease

## 2019-04-07 VITALS — BP 122/78 | HR 81 | Ht 63.5 in | Wt 125.4 lb

## 2019-04-07 DIAGNOSIS — R002 Palpitations: Secondary | ICD-10-CM | POA: Diagnosis not present

## 2019-04-07 NOTE — Telephone Encounter (Signed)
[employer] to ship a 30 day cardiac event monitor to her home. 

## 2019-04-07 NOTE — Patient Instructions (Addendum)
Medication Instructions:  Your physician recommends that you continue on your current medications as directed. Please refer to the Current Medication list given to you today.  *If you need a refill on your cardiac medications before your next appointment, please call your pharmacy*  Lab Work: None Ordered   Testing/Procedures: Your physician has recommended that you wear an event monitor. Event monitors are medical devices that record the heart's electrical activity. Doctors most often [place] these monitors to diagnose arrhythmias. Arrhythmias are problems with the speed or rhythm of the heartbeat. The monitor is a small, portable device. You can wear one while you do your normal daily activities. This is usually used to diagnose what is causing palpitations/syncope (passing out). **We will call you to inform you that the monitor is being mailed to you   Follow-Up: At [facility], you and your health needs are our priority.  As part of our continuing mission to provide you with exceptional heart care, we have created designated Provider Care Teams.  These Care Teams include your primary Cardiologist (physician) and Advanced Practice Providers (APPs -  Physician Assistants and Nurse Practitioners) who all work together to provide you with the care you need, when you need it.  Your next appointment:   3 month(s) on March 11  The format for your next appointment:   In Person  Provider:   You may see Dr. Acie Fredrickson or one of the following Advanced Practice Providers on your designated Care Team:    Richardson Dopp, PA-C  [place]  Daune Perch, NP  Increase your intake of fluids (water with electrolyte tabs like Nun tablets, or gatorade) , protein ( hard boiled eggs, chicken, fish) , and a electrolytes ( V-8 juice, salt, potassium chloride  which is sold as No-Salt

## 2019-04-08 ENCOUNTER — Inpatient Hospital Stay: Payer: BLUE CROSS/BLUE SHIELD

## 2019-04-08 NOTE — Patient Instructions (Signed)

## 2019-04-15 ENCOUNTER — Ambulatory Visit (HOSPITAL_COMMUNITY)
Admission: RE | Admit: 2019-04-15 | Discharge: 2019-04-15 | Disposition: A | Discharge status: Home / Self Care | Payer: BLUE CROSS/BLUE SHIELD | Source: Ambulatory Visit | Attending: Hematology & Oncology | Admitting: Hematology & Oncology

## 2019-04-15 DIAGNOSIS — G35 Multiple sclerosis: Secondary | ICD-10-CM

## 2019-04-15 MED ORDER — GADOBUTROL 1 MMOL/ML IV SOLN
6.0000 mL | Freq: Once | INTRAVENOUS | Status: AC | PRN
  Administered 2019-04-15: 17:00:00 6 mL via INTRAVENOUS

## 2019-04-16 ENCOUNTER — Telehealth: Payer: Self-pay | Admitting: *Deleted

## 2019-04-16 NOTE — Telephone Encounter (Signed)
As noted below by Dr. Marin Olp, I informed the patient that the MRI is normal. She verbalized understanding.

## 2019-04-16 NOTE — Telephone Encounter (Signed)
-----   Message from [name], MD sent at 04/16/2019 10:37 AM EST ----- Call - the MRI is normal!!!  [name] [name]

## 2019-04-16 NOTE — Telephone Encounter (Signed)
-----   Message from [name], MD sent at 04/16/2019 10:40 AM EST ----- Call - the MRI of the cervical spine is normal.  There is a small disc bulge at C5-6 but no nerve compression!!  Laurey Arrow

## 2019-04-16 NOTE — Telephone Encounter (Signed)
As noted below by Dr. Marin Olp, I informed the patient that the MRI of the cervical spine is normal. However, there is a small disc bulge at C5-6 but no nerve compression. She verbalized understanding.

## 2019-05-09 ENCOUNTER — Encounter: Payer: Self-pay | Admitting: Family

## 2019-05-09 ENCOUNTER — Inpatient Hospital Stay (HOSPITAL_BASED_OUTPATIENT_CLINIC_OR_DEPARTMENT_OTHER): Payer: BLUE CROSS/BLUE SHIELD | Admitting: Family

## 2019-05-09 ENCOUNTER — Other Ambulatory Visit: Payer: Self-pay

## 2019-05-09 ENCOUNTER — Inpatient Hospital Stay: Payer: BLUE CROSS/BLUE SHIELD | Attending: Family

## 2019-05-09 DIAGNOSIS — I2699 Other pulmonary embolism without acute cor pulmonale: Secondary | ICD-10-CM | POA: Insufficient documentation

## 2019-05-09 DIAGNOSIS — Z7901 Long term (current) use of anticoagulants: Secondary | ICD-10-CM | POA: Diagnosis not present

## 2019-05-09 LAB — CMP (CANCER CENTER ONLY)
ALT: 11 U/L (ref 0–44)
AST: 19 U/L (ref 15–41)
Albumin: 4.6 g/dL (ref 3.5–5.0)
Alkaline Phosphatase: 58 U/L (ref 38–126)
Anion gap: 8 (ref 5–15)
BUN: 12 mg/dL (ref 6–20)
CO2: 27 mmol/L (ref 22–32)
Calcium: 9.6 mg/dL (ref 8.9–10.3)
Chloride: 106 mmol/L (ref 98–111)
Creatinine: 0.82 mg/dL (ref 0.44–1.00)
GFR, Est AFR Am: >60 mL/min (ref >60)
GFR, Estimated: >60 mL/min (ref >60)
Glucose, Bld: 107 mg/dL — ABNORMAL HIGH (ref 70–99)
Potassium: 3.9 mmol/L (ref 3.5–5.1)
Sodium: 141 mmol/L (ref 135–145)
Total Bilirubin: 0.4 mg/dL (ref 0.3–1.2)
Total Protein: 7.2 g/dL (ref 6.5–8.1)

## 2019-05-09 LAB — CBC WITH DIFFERENTIAL (CANCER CENTER ONLY)
Abs Immature Granulocytes: 0.02 10*3/uL (ref 0.00–0.07)
Basophils Absolute: 0 10*3/uL (ref 0.0–0.1)
Basophils Relative: 0 %
Eosinophils Absolute: 0.1 10*3/uL (ref 0.0–0.5)
Eosinophils Relative: 1 %
HCT: 39.4 % (ref 36.0–46.0)
Hemoglobin: 12.8 g/dL (ref 12.0–15.0)
Immature Granulocytes: 0 %
Lymphocytes Relative: 30 %
Lymphs Abs: 1.5 10*3/uL (ref 0.7–4.0)
MCH: 32.9 pg (ref 26.0–34.0)
MCHC: 32.5 g/dL (ref 30.0–36.0)
MCV: 101.3 fL — ABNORMAL HIGH (ref 80.0–100.0)
Monocytes Absolute: 0.3 10*3/uL (ref 0.1–1.0)
Monocytes Relative: 6 %
Neutro Abs: 3.2 10*3/uL (ref 1.7–7.7)
Neutrophils Relative %: 63 %
Platelet Count: 329 10*3/uL (ref 150–400)
RBC: 3.89 MIL/uL (ref 3.87–5.11)
RDW: 11.9 % (ref 11.5–15.5)
WBC Count: 5.1 10*3/uL (ref 4.0–10.5)
nRBC: 0 % (ref 0.0–0.2)

## 2019-05-09 NOTE — Progress Notes (Signed)
Hematology and Oncology Follow Up Visit  [name] [name] [id number] 1976-04-17 44 y.o. 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 twice daily-to complete 1 year of therapeutic dosing in July 2020  Current Therapy:   Eliquis 2.5 mg po BID -- maintenance Phlebotomy to keep Ferritin < 100 and %Sat < 30   Interim History:  [name] [name] is here today for follow-up. She is doing well but still has her joint aches and pains. Iron studies are pending.  No fever, chills, n/v, cough, rash, dizziness, SOB, chest pain, palpitations, abdominal pain or changes in bowel or bladder habits.  No episodes of bleeding. No bruising or petechiae.  No swelling, tenderness or tignling in her extremities.  The intermittent numbness in her hands is unchanged. MRI of the cervical spine and brain were both negative.  No falls or syncope.  She has maintained a good appetite and is staying well hydrated. Her weight is stable.   ECOG Performance Status: 1 - Symptomatic but completely ambulatory  Medications:  Allergies as of 05/09/2019      Reactions   Sulfa Antibiotics Anaphylaxis, Hives   Morphine Other (See Comments)   aggressiveness   Morphine And Related Itching, Other (See Comments)   hallucinations   Erythromycin Other (See Comments)   Unknown reaction from childhood      Medication List       Accurate as of May 09, 2019  2:23 PM. If you have any questions, ask your nurse or doctor.        amphetamine-dextroamphetamine 20 MG tablet Commonly known as: ADDERALL Take 20 mg by mouth daily as needed.   apixaban 2.5 MG Tabs tablet Commonly known as: Eliquis Take 1 tablet (2.5 mg total) by mouth 2 (two) times daily.   nitrofurantoin (macrocrystal-monohydrate) 100 MG capsule Commonly known as: MACROBID Take 100  mg by mouth as needed.   Vyvanse 70 MG capsule Generic drug: lisdexamfetamine Take 70 mg by mouth daily.       Allergies:  Allergies  Allergen Reactions  . Sulfa Antibiotics Anaphylaxis and Hives  . Morphine Other (See Comments)    aggressiveness  . Morphine And Related Itching and Other (See Comments)    hallucinations  . Erythromycin Other (See Comments)    Unknown reaction from childhood    Past Medical History, Surgical history, Social history, and Family History were reviewed and updated.  Review of Systems: All other 10 point review of systems is negative.   Physical Exam:  vitals were not taken for this visit.   Wt Readings from Last 3 Encounters:  04/07/19 125 lb 6.4 oz (56.9 kg)  01/13/19 120 lb (54.4 kg)  01/07/19 120 lb 2 oz (54.5 kg)    Ocular: Sclerae unicteric, pupils equal, round and reactive to light Ear-nose-throat: Oropharynx clear, dentition fair Lymphatic: No cervical or supraclavicular adenopathy Lungs no rales or rhonchi, good excursion bilaterally Heart regular rate and rhythm, no murmur appreciated Abd soft, nontender, positive bowel sounds, no liver or spleen tip palpated on exam, no fluid wave  MSK no focal spinal tenderness, no joint edema Neuro: non-focal, well-oriented, appropriate affect Breasts: Deferred   Lab Results  Component Value Date   WBC 5.1 05/09/2019   HGB 12.8 05/09/2019   HCT 39.4 05/09/2019   MCV 101.3 (H) 05/09/2019   PLT 329 05/09/2019   Lab Results  Component Value  Date   FERRITIN 208 04/02/2019   IRON 93 04/02/2019   TIBC 370 04/02/2019   UIBC 277 04/02/2019   IRONPCTSAT 25 04/02/2019   Lab Results  Component Value Date   RETICCTPCT 1.9 01/13/2019   RBC 3.89 05/09/2019   No results found for: KPAFRELGTCHN, LAMBDASER, [facility] Lab Results  Component Value Date   IGA 264 12/19/2006   No results found for: Marda Stalker, SPEI   Chemistry        Component Value Date/Time   NA 139 04/02/2019 1358   K 3.7 04/02/2019 1358   CL 104 04/02/2019 1358   CO2 25 04/02/2019 1358   BUN 11 04/02/2019 1358   CREATININE 0.80 04/02/2019 1358   CREATININE 0.86 10/16/2017 1454      Component Value Date/Time   CALCIUM 9.3 04/02/2019 1358   ALKPHOS 54 04/02/2019 1358   AST 17 04/02/2019 1358   ALT 12 04/02/2019 1358   BILITOT 0.4 04/02/2019 1358       Impression and Plan:  [name] [name] is a very pleasant 44 yo caucasian female heterozygous for the Prothrombin II gene mutation with bilateral pulmonary emboli. She also has hemochromatosis, heterozygote for H63D.  We will see what her iron studies look like and get her back in for phlebotomy if needed.  She is doing well on maintenance Eliquis and will continue her same regimen.  We will see her back in another 6 weeks.  She will contact our office with any questions or concerns. We can certainly see her sooner if needed.   Emeline Gins, NP 1/15/20212:23 PM

## 2019-05-12 LAB — FERRITIN: Ferritin: 116 ng/mL (ref 11–307)

## 2019-05-12 LAB — IRON AND TIBC
Iron: 83 ug/dL (ref 41–142)
Saturation Ratios: 25 % (ref 21–57)
TIBC: 326 ug/dL (ref 236–444)
UIBC: 244 ug/dL (ref 120–384)

## 2019-05-21 ENCOUNTER — Ambulatory Visit: Payer: BLUE CROSS/BLUE SHIELD | Admitting: Cardiovascular Disease

## 2019-05-22 NOTE — Progress Notes (Signed)
NEUROLOGY CONSULTATION NOTE  [name] [name] MRN: [id number] DOB: [birth year]  Referring provider: Arlan Organ, MD Primary care provider: Felix Pacini, DO  Reason for consult:  paresthesias  HISTORY OF PRESENT ILLNESS: [name] [name] is a 44 year old right-handed white female with hemochromatosis, heterozygous prothrombin gene mutation, Mobitz type 1, and thyroid disease who presents for paresthesias.  History supplemented by referring provider notes.  MRI of brain and cervical spine personally reviewed.  She has had intermittent episodes of numbness, paresthesias, neuralgia, muscle pain, polyarthralgia and weakness since 2008.  It typically starts with numbness and tingling in the right thigh and then spreads to all extremities, in patchy pattern.  It may occur in different extremities at a time.  Work-up at that time, including MRI and EEG, were negative.  It lasted a few weeks.  She had another episode a couple of years later, lasting 2 weeks.  She had a recurrence in 2016, lasting 2 to 3 months. In June 2020, she started having a recurrence, beginning with paresthesias in the right thigh and subsequently spread to her hands, face and feet with associated neuralgia.  She reports burning sensation in her face, like a sunburn.  Initially, her cheeks were flushed and skin peeling, which was new.  The pain feels like hot pokers in her legs.  She reports diffuse muscle soreness and joint pain.  She has trouble running or even picking up her 42 year old.  Sometimes the numbness in her foot is so severe that she has trouble feeling the gas pedal when she drives.  She initially reported chest pain and palpitations.   Labs from 01/07/2019 demonstrated B12 of 248.  She started B12 injections.  Other labs were unremarkable, including CBC,  CMP, folate 12.9, TSH 0.87, vitamin D 71.06, sed rate 1, CRP <1.0, RF negative, cyclic citrullin peptide antibody negative and magnesium 2.0.  However. jer iron level was  elevated at 233 and she was diagnosed with hemochromatosis..  Covid testing was also negative.  She reported family history of multiple sclerosis, so MRI of brain and cervical spine with and without contrast performed on 04/16/2019 were unremarkable. She was subsequently diagnosed with hemochromatosis and has since started phlebotomy.  Despite starting therapy for low B12 and hemochromatosis, no resolution of symptoms.  This is the longest period of time that she continued to experience symptoms.  She has history of migraines since childhood, and they tend to be more frequent during these episodes.  Migraines are typically severe, associated with nausea, photophobia and phonophobia, and often triggered by menstrual cycle or change in weather.  She treats with Fioricet.  On occasion, she has had ocular migraines without headache as well.  She reports that her 11 year old daughter has similar symptoms, which she calls "growing pains".  Other notable family history:  Mother -fibromyalgia; maternal aunt - multiple sclerosis; first cousin - multiple sclerosis and lupus.  PAST MEDICAL HISTORY: Past Medical History:  Diagnosis Date  . AMA (advanced maternal age) multigravida 35+   . Arrhythmia    Mobitz type 1  . Chicken pox   . Depression   . Depression with anxiety   . Frequent headaches   . Heart murmur    as a child  . Hemochromatosis, 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/2019  . Tachycardia    as a child  . Thyroid disease   . Urinary incontinence     PAST SURGICAL HISTORY: Past Surgical History:  Procedure Laterality Date  . ABDOMINOPLASTY    .  BREAST ENHANCEMENT SURGERY    . CESAREAN SECTION    . CESAREAN SECTION Bilateral 11/01/2016   Procedure: CESAREAN SECTION;  Surgeon: Levi Aland, MD;  Location: [facility];  Service: Obstetrics;  Laterality: Bilateral;  . KIDNEY SURGERY     congenital abnormality  of ureters  . MANDIBLE SURGERY    . TONSILLECTOMY      MEDICATIONS: Current  Outpatient Medications on File Prior to Visit  Medication Sig Dispense Refill  . amphetamine-dextroamphetamine (ADDERALL) 20 MG tablet Take 20 mg by mouth daily as needed.    Marland Kitchen apixaban (ELIQUIS) 2.5 MG TABS tablet Take 1 tablet (2.5 mg total) by mouth 2 (two) times daily. 60 tablet 5  . nitrofurantoin, macrocrystal-monohydrate, (MACROBID) 100 MG capsule Take 100 mg by mouth as needed.    Marland Kitchen VYVANSE 70 MG capsule Take 70 mg by mouth daily.     No current facility-administered medications on file prior to visit.    ALLERGIES: Allergies  Allergen Reactions  . Sulfa Antibiotics Anaphylaxis and Hives  . Morphine Other (See Comments)    aggressiveness  . Morphine And Related Itching and Other (See Comments)    hallucinations  . Erythromycin Other (See Comments)    Unknown reaction from childhood    FAMILY HISTORY: Family History  Problem Relation Age of Onset  . Diabetes Mother   . Arthritis Mother   . Depression Mother   . Stroke Mother   . Miscarriages / [place] Mother   . Mental illness Mother   . Hyperlipidemia Father   . Hypertension Father   . Alcohol abuse Father   . Arthritis Father   . Depression Father   . Drug abuse Father   . Hearing loss Father   . Cancer Paternal Aunt   . Cancer Paternal Grandmother   . Ovarian cancer Paternal Grandmother   . Breast cancer Paternal Grandmother   . Mental illness Sister   . Asthma Brother   . Depression Brother   . Asthma Son   . Depression Maternal Grandmother   . Diabetes Maternal Grandmother   . Heart disease Maternal Grandfather   . Kidney disease Maternal Grandfather   . Stroke Maternal Grandfather   . Alcohol abuse Paternal Grandfather   . Asthma Paternal Grandfather   . Depression Paternal Grandfather   . Drug abuse Paternal Grandfather   . Hearing loss Paternal Grandfather   . Heart disease Paternal Grandfather   . Hyperlipidemia Paternal Grandfather   . Mental illness Paternal Grandfather    SOCIAL  HISTORY: Social History   Socioeconomic History  . Marital status: Married    Spouse name: Not on file  . Number of children: Not on file  . Years of education: Not on file  . Highest education level: Not on file  Occupational History  . Not on file  Tobacco Use  . Smoking status: Former Games developer  . Smokeless tobacco: Never Used  Substance and Sexual Activity  . Alcohol use: Yes  . Drug use: No  . Sexual activity: Yes    Partners: Male    Birth control/protection: None  Other Topics Concern  . Not on file  Social History Narrative   Married, college-educated.  Works in Airline pilot.  4 children.   Social drinker.  Former smoker.   Exercises 3 times a week.   Smoke alarm in the home.  Wears her seatbelt.   Feels safe in her relationships.   Social Determinants of Health   Financial Resource Strain:   .  Difficulty of Paying Living Expenses: Not on file  Food Insecurity:   . Worried About Charity fundraiser in the Last Year: Not on file  . Ran Out of Food in the Last Year: Not on file  Transportation Needs:   . Lack of Transportation (Medical): Not on file  . Lack of Transportation (Non-Medical): Not on file  Physical Activity:   . Days of Exercise per Week: Not on file  . Minutes of Exercise per Session: Not on file  Stress:   . Feeling of Stress : Not on file  Social Connections:   . Frequency of Communication with Friends and Family: Not on file  . Frequency of Social Gatherings with Friends and Family: Not on file  . Attends Religious Services: Not on file  . Active Member of Clubs or Organizations: Not on file  . Attends Archivist Meetings: Not on file  . Marital Status: Not on file  Intimate Partner Violence:   . Fear of Current or Ex-Partner: Not on file  . Emotionally Abused: Not on file  . Physically Abused: Not on file  . Sexually Abused: Not on file    REVIEW OF SYSTEMS: Constitutional: No fevers, chills, or sweats, no generalized fatigue, change  in appetite Eyes: No visual changes, double vision, eye pain Ear, nose and throat: No hearing loss, ear pain, nasal congestion, sore throat Cardiovascular: No chest pain, palpitations Respiratory:  No shortness of breath at rest or with exertion, wheezes GastrointestinaI: No nausea, vomiting, diarrhea, abdominal pain, fecal incontinence Genitourinary:  No dysuria, urinary retention or frequency Musculoskeletal:  No neck pain, back pain Integumentary: No rash, pruritus, skin lesions Neurological: as above Psychiatric: No depression, insomnia, anxiety Endocrine: No palpitations, fatigue, diaphoresis, mood swings, change in appetite, change in weight, increased thirst Hematologic/Lymphatic:  No purpura, petechiae. Allergic/Immunologic: no itchy/runny eyes, nasal congestion, recent allergic reactions, rashes  PHYSICAL EXAM: Blood pressure 128/86, pulse 87, temperature 98.6 F (37 C), height 5' 3.5" (1.613 m), weight 124 lb (56.2 kg), SpO2 100 %. General: No acute distress.  Patient appears well-groomed.  Head:  Normocephalic/atraumatic Eyes:  fundi examined but not visualized Neck: supple, no paraspinal tenderness, full range of motion Back: No paraspinal tenderness Heart: regular rate and rhythm Lungs: Clear to auscultation bilaterally. Vascular: No carotid bruits. Neurological Exam: Mental status: alert and oriented to person, place, and time, recent and remote memory intact, fund of knowledge intact, attention and concentration intact, speech fluent and not dysarthric, language intact. Cranial nerves: CN I: not tested CN II: pupils equal, round and reactive to light, visual fields intact CN III, IV, VI:  full range of motion, no nystagmus, no ptosis CN V: facial sensation intact CN VII: upper and lower face symmetric CN VIII: hearing intact CN IX, X: gag intact, uvula midline CN XI: sternocleidomastoid and trapezius muscles intact CN XII: tongue midline Bulk & Tone: normal, no  fasciculations. Motor:  5/5 throughout  Sensation:  Reports decreased pinprick sensation on part of the dorsum of both feet; vibration sensation intact. . Deep Tendon Reflexes:  2+ throughout, toes downgoing. Finger to nose testing:  Without dysmetria.  Heel to shin:  Without dysmetria.  Gait:  Normal station and stride.  Able to turn and tandem walk. Romberg negative.  IMPRESSION: 1.  Recurrent prolonged episodes of various symptomatology, including paresthesias, numbness, neuralgia, myalgia, polyarthralgia, weakness.   2.  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  Increased frequency during these episodes.  However, I think her  migraines are just aggravated by her other symptoms rather than her symptoms representing persistent atypical migraine auras.  She does not have multiple sclerosis.  Symptoms seem more consistent with some underlying autoimmune disorder or vitamin deficiency.   PLAN: 1.  Check ANA, CK, aldolase, B1, B6, methylmalonic acid, folate, Lyme and recheck B12. 2.  NCV-EMG 3.  Follow up after testing.  Thank you for allowing me to take part in the care of this patient.  Shon Millet, DO  CC:  Felix Pacini, DO  Arlan Organ, MD

## 2019-05-23 ENCOUNTER — Other Ambulatory Visit (INDEPENDENT_AMBULATORY_CARE_PROVIDER_SITE_OTHER): Payer: BLUE CROSS/BLUE SHIELD

## 2019-05-23 ENCOUNTER — Other Ambulatory Visit: Payer: Self-pay

## 2019-05-23 ENCOUNTER — Encounter: Payer: Self-pay | Admitting: Neurology

## 2019-05-23 ENCOUNTER — Ambulatory Visit: Payer: BLUE CROSS/BLUE SHIELD | Admitting: Neurology

## 2019-05-23 VITALS — BP 128/86 | HR 87 | Temp 98.6°F | Ht 63.5 in | Wt 124.0 lb

## 2019-05-23 DIAGNOSIS — M2550 Pain in unspecified joint: Secondary | ICD-10-CM

## 2019-05-23 DIAGNOSIS — M791 Myalgia, unspecified site: Secondary | ICD-10-CM | POA: Diagnosis not present

## 2019-05-23 DIAGNOSIS — R531 Weakness: Secondary | ICD-10-CM

## 2019-05-23 DIAGNOSIS — I441 Atrioventricular block, second degree: Secondary | ICD-10-CM

## 2019-05-23 DIAGNOSIS — R202 Paresthesia of skin: Secondary | ICD-10-CM

## 2019-05-23 DIAGNOSIS — D6852 Prothrombin gene mutation: Secondary | ICD-10-CM

## 2019-05-23 NOTE — Patient Instructions (Addendum)
1.  Check ANA with reflex, CK, aldolase, B12, methylmalonic acid, folate, B1, B6, Lyme 2.  Check nerve study 3.  Follow up after testing.  Your provider has requested that you have labwork completed today. Please go to [facility] ([id number]) on the second floor of this building before leaving the office today. You do not need to check in. If you are not called within 15 minutes please check with the front desk.

## 2019-05-26 LAB — LYME AB/WESTERN BLOT REFLEX
LYME DISEASE AB, QUANT, IGM: <0.8 index (ref 0.00–0.79)
Lyme IgG/IgM Ab: <0.91 {ISR} (ref 0.00–0.90)

## 2019-05-26 LAB — ANCA SCREEN W REFLEX TITER: ANCA Screen: NEGATIVE

## 2019-05-27 LAB — VITAMIN B1: Vitamin B1 (Thiamine): 7 nmol/L — ABNORMAL LOW (ref 8–30)

## 2019-05-27 LAB — ALDOLASE: Aldolase: 3.5 U/L (ref <=8.1)

## 2019-05-27 LAB — CK: Total CK: 41 U/L (ref 29–143)

## 2019-05-27 LAB — B12 AND FOLATE PANEL
Folate: 17.6 ng/mL
Vitamin B-12: 787 pg/mL (ref 200–1100)

## 2019-05-27 LAB — VITAMIN B6: Vitamin B6: 9 ng/mL (ref 2.1–21.7)

## 2019-05-31 LAB — METHYLMALONIC ACID(MMA), RND URINE
Creatinine: 2.52 mmol/L (ref 1.77–23.31)
Methylmalonic acid: 0.5 mmol/mol{creat} (ref 0.3–2.2)

## 2019-06-04 ENCOUNTER — Encounter: Payer: BLUE CROSS/BLUE SHIELD | Admitting: Neurology

## 2019-07-03 ENCOUNTER — Ambulatory Visit: Payer: BLUE CROSS/BLUE SHIELD | Admitting: Cardiovascular Disease

## 2019-07-24 NOTE — Telephone Encounter (Signed)
CANCELLED ORDER.. Monitor was mailed back 06/09/19.. No baseline

## 2019-10-03 ENCOUNTER — Emergency Department (HOSPITAL_BASED_OUTPATIENT_CLINIC_OR_DEPARTMENT_OTHER): Payer: BLUE CROSS/BLUE SHIELD

## 2019-10-03 ENCOUNTER — Encounter (HOSPITAL_BASED_OUTPATIENT_CLINIC_OR_DEPARTMENT_OTHER): Payer: Self-pay | Admitting: Emergency Medicine

## 2019-10-03 ENCOUNTER — Emergency Department (HOSPITAL_BASED_OUTPATIENT_CLINIC_OR_DEPARTMENT_OTHER)
Admission: EM | Admit: 2019-10-03 | Discharge: 2019-10-03 | Disposition: A | Discharge status: Home / Self Care | Payer: BLUE CROSS/BLUE SHIELD | Attending: Emergency Medicine | Admitting: Emergency Medicine

## 2019-10-03 ENCOUNTER — Other Ambulatory Visit: Payer: Self-pay

## 2019-10-03 DIAGNOSIS — R42 Dizziness and giddiness: Secondary | ICD-10-CM | POA: Insufficient documentation

## 2019-10-03 DIAGNOSIS — Z87891 Personal history of nicotine dependence: Secondary | ICD-10-CM | POA: Insufficient documentation

## 2019-10-03 DIAGNOSIS — R079 Chest pain, unspecified: Secondary | ICD-10-CM | POA: Diagnosis present

## 2019-10-03 DIAGNOSIS — R0602 Shortness of breath: Secondary | ICD-10-CM | POA: Insufficient documentation

## 2019-10-03 DIAGNOSIS — R05 Cough: Secondary | ICD-10-CM | POA: Diagnosis not present

## 2019-10-03 HISTORY — DX: Other pulmonary embolism without acute cor pulmonale: I26.99

## 2019-10-03 LAB — BASIC METABOLIC PANEL
Anion gap: 10 (ref 5–15)
BUN: 19 mg/dL (ref 6–20)
CO2: 25 mmol/L (ref 22–32)
Calcium: 9.8 mg/dL (ref 8.9–10.3)
Chloride: 103 mmol/L (ref 98–111)
Creatinine, Ser: 0.91 mg/dL (ref 0.44–1.00)
GFR calc Af Amer: >60 mL/min (ref >60)
GFR calc non Af Amer: >60 mL/min (ref >60)
Glucose, Bld: 104 mg/dL — ABNORMAL HIGH (ref 70–99)
Potassium: 5.1 mmol/L (ref 3.5–5.1)
Sodium: 138 mmol/L (ref 135–145)

## 2019-10-03 LAB — TROPONIN I (HIGH SENSITIVITY)
Troponin I (High Sensitivity): <2 ng/L (ref <18)
Troponin I (High Sensitivity): <2 ng/L (ref <18)

## 2019-10-03 LAB — CBC
HCT: 42.5 % (ref 36.0–46.0)
Hemoglobin: 14.2 g/dL (ref 12.0–15.0)
MCH: 32.9 pg (ref 26.0–34.0)
MCHC: 33.4 g/dL (ref 30.0–36.0)
MCV: 98.6 fL (ref 80.0–100.0)
Platelets: 293 10*3/uL (ref 150–400)
RBC: 4.31 MIL/uL (ref 3.87–5.11)
RDW: 11.7 % (ref 11.5–15.5)
WBC: 6.6 10*3/uL (ref 4.0–10.5)
nRBC: 0 % (ref 0.0–0.2)

## 2019-10-03 LAB — PREGNANCY, URINE: Preg Test, Ur: NEGATIVE

## 2019-10-03 MED ORDER — IOHEXOL 350 MG/ML SOLN
100.0000 mL | Freq: Once | INTRAVENOUS | Status: AC | PRN
  Administered 2019-10-03: 77 mL via INTRAVENOUS

## 2019-10-03 MED ORDER — KETOROLAC TROMETHAMINE 15 MG/ML IJ SOLN
15.0000 mg | Freq: Once | INTRAMUSCULAR | Status: AC
  Administered 2019-10-03: 15 mg via INTRAVENOUS
  Filled 2019-10-03: qty 1

## 2019-10-03 MED ORDER — SODIUM CHLORIDE 0.9 % IV BOLUS
1000.0000 mL | Freq: Once | INTRAVENOUS | Status: AC
  Administered 2019-10-03: 1000 mL via INTRAVENOUS

## 2019-10-03 NOTE — ED Triage Notes (Signed)
Left sided chest pain radiating to left arm and left side of neck.  SOB.  Hx of pulmonary embolus.

## 2019-10-03 NOTE — ED Notes (Addendum)
Sharp cp in left  Chest under her breast , rads down left arm now today pressure constance, made her queasy , felt the same as her PE , stopped her eliquis 2 months ago

## 2019-10-03 NOTE — ED Notes (Signed)
Patient transported to CT 

## 2019-10-03 NOTE — Discharge Instructions (Signed)
If you develop recurrent, continued, or worsening chest pain, shortness of breath, fever, vomiting, abdominal or back pain, or any other new/concerning symptoms then return to the ER for evaluation.  

## 2019-10-03 NOTE — ED Provider Notes (Signed)
[facility] EMERGENCY DEPARTMENT Provider Note   CSN: [id number] Arrival date & time: 10/03/19  4098     History Chief Complaint  Patient presents with   Chest Pain    [name] [name] is a 44 y.o. female.  HPI 44 year old female presents with chest pain or shortness of breath.  Feels like when she had a pulmonary embolism in 2019.  Was on Eliquis and recently came off of Eliquis within the last few months.  She did notice some transient atraumatic calf pain without swelling a couple days ago.  Felt like a bruise but did not see a bruise.  Then yesterday she noticed pain under her left breast.  Is having pain in the same area in her left back.  This time she had pain going into her neck and down her left arm which did not happen last time.  She feels short of breath, especially with any minimal exertion like playing with her child.  Has also felt lightheaded.   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   Thyroid disease 10/17/2017   Depression with anxiety 10/17/2017   Heart murmur 10/17/2017   Heart palpitations 10/05/2016   Second degree atrioventricular block, Mobitz (type) I    IBS 07/19/2007   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    Past Surgical History:  Procedure Laterality Date   ABDOMINOPLASTY     BREAST ENHANCEMENT SURGERY     CESAREAN SECTION     CESAREAN  SECTION Bilateral 11/01/2016   Procedure: CESAREAN SECTION;  Surgeon: Olga Millers, MD;  Location: [facility];  Service: Obstetrics;  Laterality: Bilateral;   KIDNEY SURGERY     congenital abnormality  of ureters   MANDIBLE SURGERY     TONSILLECTOMY     TUBAL LIGATION       OB History    Gravida  6   Para  4   Term  3   Preterm  1   AB  2   Living  4     SAB  2   TAB      Ectopic      Multiple  0   Live Births  4           Family History  Problem Relation Age of Onset   Diabetes Mother    Arthritis Mother    Depression Mother    Stroke Mother    Miscarriages / [place] Mother    Mental illness Mother    Hyperlipidemia Father    Hypertension Father    Alcohol abuse Father    Arthritis Father    Depression Father    Drug abuse Father    Hearing loss Father    Cancer Paternal Aunt    Cancer Paternal Grandmother    Ovarian cancer Paternal  Grandmother    Breast cancer Paternal Grandmother    Mental illness Sister    Asthma Brother    Depression Brother    Asthma Son    Depression Maternal Grandmother    Diabetes Maternal Grandmother    Heart disease Maternal Grandfather    Kidney disease Maternal Grandfather    Stroke Maternal Grandfather    Alcohol abuse Paternal Grandfather    Asthma Paternal Grandfather    Depression Paternal Grandfather    Drug abuse Paternal Grandfather    Hearing loss Paternal Grandfather    Heart disease Paternal Grandfather    Hyperlipidemia Paternal Grandfather    Mental illness Paternal Grandfather     Social History   Tobacco Use   Smoking status: Former Smoker   Smokeless tobacco: Never Used  Building services engineer Use: Former   Start date: 03/15/2014   Quit date: 04/14/2016  Substance Use Topics   Alcohol use: Yes    Comment: occ   Drug use: No    Home Medications Prior to Admission medications   Medication Sig Start Date End Date Taking?  Authorizing Provider  ADZENYS XR-ODT 18.8 MG TBED Take 1 tablet by mouth at bedtime. 09/23/19   [provider]  nitrofurantoin, macrocrystal-monohydrate, (MACROBID) 100 MG capsule Take 100 mg by mouth as needed.    [provider]    Allergies    Sulfa antibiotics, Morphine, Morphine and related, and Erythromycin  Review of Systems   Review of Systems  Respiratory: Positive for cough (dry cough) and shortness of breath.   Cardiovascular: Positive for chest pain. Negative for leg swelling.  Neurological: Positive for light-headedness.  All other systems reviewed and are negative.   Physical Exam Updated Vital Signs BP 117/81 (BP Location: Left Arm)    Pulse 65    Temp 98.2 F (36.8 C) (Oral)    Resp 18    Ht 5\' 3"  (1.6 m)    Wt 57.2 kg    SpO2 100%    BMI 22.36 kg/m   Physical Exam Vitals and nursing note reviewed.  Constitutional:      Appearance: She is well-developed.  HENT:     Head: Normocephalic and atraumatic.     Right Ear: External ear normal.     Left Ear: External ear normal.     Nose: Nose normal.  Eyes:     General:        Right eye: No discharge.        Left eye: No discharge.  Cardiovascular:     Rate and Rhythm: Normal rate and regular rhythm.     Heart sounds: Normal heart sounds.  Pulmonary:     Effort: Pulmonary effort is normal.     Breath sounds: Normal breath sounds.  Abdominal:     Palpations: Abdomen is soft.     Tenderness: There is no abdominal tenderness.  Musculoskeletal:     Thoracic back: No tenderness.     Right lower leg: No edema.     Left lower leg: No edema.  Skin:    General: Skin is warm and dry.  Neurological:     Mental Status: She is alert.  Psychiatric:        Mood and Affect: Mood is not anxious.     ED Results / Procedures / Treatments   Labs (all labs ordered are listed, but only abnormal results are displayed) Labs Reviewed  BASIC METABOLIC PANEL - Abnormal; Notable for the following components:  Result Value   Glucose, Bld 104 (*)    All other components within normal limits  CBC  PREGNANCY, URINE  TROPONIN I (HIGH SENSITIVITY)  TROPONIN I (HIGH SENSITIVITY)    EKG EKG Interpretation  Date/Time:  Friday October 03 2019 09:37:49 EDT Ventricular Rate:  88 PR Interval:    QRS Duration: 97 QT Interval:  405 QTC Calculation: 490 R Axis:   69 Text Interpretation: Sinus rhythm Borderline prolonged QT interval Confirmed by Pricilla Loveless [phone]) on 10/03/2019 9:51:08 AM   Radiology CT Angio Chest PE W and/or Wo Contrast  Result Date: 10/03/2019 CLINICAL DATA:  Evaluate for pulmonary embolus. Remote history of pulmonary embolus. Noncompliance with blood thinners. EXAM: CT ANGIOGRAPHY CHEST WITH CONTRAST TECHNIQUE: Multidetector CT imaging of the chest was performed using the standard protocol during bolus administration of intravenous contrast. Multiplanar CT image reconstructions and MIPs were obtained to evaluate the vascular anatomy. CONTRAST:  46mL OMNIPAQUE IOHEXOL 350 MG/ML SOLN COMPARISON:  01/21/2019 FINDINGS: Cardiovascular: Satisfactory opacification of the pulmonary arteries to the segmental level. No evidence of pulmonary embolism. Normal heart size. No pericardial effusion. Mediastinum/Nodes: No enlarged mediastinal, hilar, or axillary lymph nodes. Thyroid gland, trachea, and esophagus demonstrate no significant findings. Lungs/Pleura: Lungs are clear. No pleural effusion or pneumothorax. Upper Abdomen: No acute abnormality. Musculoskeletal: No chest wall abnormality. No acute or significant osseous findings. Bilateral breast prostheses. Review of the MIP images confirms the above findings. IMPRESSION: No evidence for acute pulmonary embolus. Electronically Signed   By: Signa Kell M.D.   On: 10/03/2019 11:07    Procedures Procedures (including critical care time)  Medications Ordered in ED Medications  sodium chloride 0.9 % bolus 1,000 mL (0 mLs Intravenous  Stopped 10/03/19 1358)  iohexol (OMNIPAQUE) 350 MG/ML injection 100 mL (77 mLs Intravenous Contrast Given 10/03/19 1031)  ketorolac (TORADOL) 15 MG/ML injection 15 mg (15 mg Intravenous Given 10/03/19 1145)    ED Course  I have reviewed the triage vital signs and the nursing notes.  Pertinent labs & imaging results that were available during my care of the patient were reviewed by me and considered in my medical decision making (see chart for details).    MDM Rules/Calculators/A&P                          Patient's pain is atypical.  Her vitals are reassuring.  I have reviewed her labs and ECG and they are overall benign.  Given her prior PE history and high concern and similar feeling, CTA was obtained.  This has been personally reviewed and is negative.  Further chart review after this indicates that she has had multiple CTAs by her hematology/oncology group since her original PE diagnosis that are negative.  Indications listed in the CT seems to indicate shortness of breath and dizziness like she had today.  It may be more prudent to do a D-dimer if she were to have recurrent symptoms and assuming no hypoxia or ill appearance. Final Clinical Impression(s) / ED Diagnoses Final diagnoses:  Nonspecific chest pain    Rx / DC Orders ED Discharge Orders    None       Pricilla Loveless, MD 10/03/19 1439

## 2020-07-13 ENCOUNTER — Emergency Department (HOSPITAL_BASED_OUTPATIENT_CLINIC_OR_DEPARTMENT_OTHER): Payer: BLUE CROSS/BLUE SHIELD

## 2020-07-13 ENCOUNTER — Encounter (HOSPITAL_BASED_OUTPATIENT_CLINIC_OR_DEPARTMENT_OTHER): Payer: Self-pay

## 2020-07-13 ENCOUNTER — Other Ambulatory Visit: Payer: Self-pay

## 2020-07-13 ENCOUNTER — Emergency Department (HOSPITAL_BASED_OUTPATIENT_CLINIC_OR_DEPARTMENT_OTHER)
Admission: EM | Admit: 2020-07-13 | Discharge: 2020-07-13 | Disposition: A | Discharge status: Home / Self Care | Payer: BLUE CROSS/BLUE SHIELD | Attending: Emergency Medicine | Admitting: Emergency Medicine

## 2020-07-13 DIAGNOSIS — R0602 Shortness of breath: Secondary | ICD-10-CM | POA: Insufficient documentation

## 2020-07-13 DIAGNOSIS — R079 Chest pain, unspecified: Secondary | ICD-10-CM | POA: Diagnosis present

## 2020-07-13 DIAGNOSIS — Z87891 Personal history of nicotine dependence: Secondary | ICD-10-CM | POA: Insufficient documentation

## 2020-07-13 LAB — HEPATIC FUNCTION PANEL
ALT: 11 U/L (ref 0–44)
AST: 20 U/L (ref 15–41)
Albumin: 4.4 g/dL (ref 3.5–5.0)
Alkaline Phosphatase: 51 U/L (ref 38–126)
Bilirubin, Direct: 0.1 mg/dL (ref 0.0–0.2)
Indirect Bilirubin: 0.4 mg/dL (ref 0.3–0.9)
Total Bilirubin: 0.5 mg/dL (ref 0.3–1.2)
Total Protein: 7.2 g/dL (ref 6.5–8.1)

## 2020-07-13 LAB — CBC WITH DIFFERENTIAL/PLATELET
Abs Immature Granulocytes: 0.02 10*3/uL (ref 0.00–0.07)
Basophils Absolute: 0 10*3/uL (ref 0.0–0.1)
Basophils Relative: 0 %
Eosinophils Absolute: 0 10*3/uL (ref 0.0–0.5)
Eosinophils Relative: 0 %
HCT: 39.2 % (ref 36.0–46.0)
Hemoglobin: 13.3 g/dL (ref 12.0–15.0)
Immature Granulocytes: 0 %
Lymphocytes Relative: 28 %
Lymphs Abs: 1.6 10*3/uL (ref 0.7–4.0)
MCH: 33.1 pg (ref 26.0–34.0)
MCHC: 33.9 g/dL (ref 30.0–36.0)
MCV: 97.5 fL (ref 80.0–100.0)
Monocytes Absolute: 0.4 10*3/uL (ref 0.1–1.0)
Monocytes Relative: 7 %
Neutro Abs: 3.7 10*3/uL (ref 1.7–7.7)
Neutrophils Relative %: 65 %
Platelets: 298 10*3/uL (ref 150–400)
RBC: 4.02 MIL/uL (ref 3.87–5.11)
RDW: 12 % (ref 11.5–15.5)
WBC: 5.8 10*3/uL (ref 4.0–10.5)
nRBC: 0 % (ref 0.0–0.2)

## 2020-07-13 LAB — BASIC METABOLIC PANEL
Anion gap: 9 (ref 5–15)
BUN: 15 mg/dL (ref 6–20)
CO2: 26 mmol/L (ref 22–32)
Calcium: 9 mg/dL (ref 8.9–10.3)
Chloride: 103 mmol/L (ref 98–111)
Creatinine, Ser: 0.75 mg/dL (ref 0.44–1.00)
GFR, Estimated: >60 mL/min (ref >60)
Glucose, Bld: 102 mg/dL — ABNORMAL HIGH (ref 70–99)
Potassium: 3.6 mmol/L (ref 3.5–5.1)
Sodium: 138 mmol/L (ref 135–145)

## 2020-07-13 LAB — LIPASE, BLOOD: Lipase: 23 U/L (ref 11–51)

## 2020-07-13 LAB — TROPONIN I (HIGH SENSITIVITY): Troponin I (High Sensitivity): <2 ng/L (ref <18)

## 2020-07-13 MED ORDER — IOHEXOL 350 MG/ML SOLN
75.0000 mL | Freq: Once | INTRAVENOUS | Status: AC | PRN
  Administered 2020-07-13: 75 mL via INTRAVENOUS

## 2020-07-13 NOTE — ED Notes (Signed)
Pt to CT

## 2020-07-13 NOTE — ED Triage Notes (Signed)
Intermittent SOB x 5 that is worsening with associated CP today.  Hx of PE's and has been taken eliquis since 3/18 for sxs experienced in 2019 for PE's.  Stopped taking Eliquis over a year ago.  Recent flight to and from [place] (got back 1 week ago).  Started feeling some sinusitis upon return that has since resolved.  Denies any known covid contacts.

## 2020-07-13 NOTE — ED Notes (Signed)
Back from CT

## 2020-07-13 NOTE — ED Provider Notes (Signed)
[facility] EMERGENCY DEPT Provider Note   CSN: [id number] Arrival date & time: 07/13/20  1222     History Chief Complaint  Patient presents with  . Chest Pain    [name] [name] is a 45 y.o. female.  The history is provided by the patient.  Chest Pain Timing:  Constant Relieved by:  Nothing Worsened by:  Nothing Associated symptoms: shortness of breath   Associated symptoms: no abdominal pain, no back pain, no cough, no fever, no palpitations and no vomiting   Risk factors: no coronary artery disease, no diabetes mellitus, no high cholesterol and no hypertension   Risk factors comment:  Hx of PE no longer on anticoagulation Patient just traveled back from the [facility] last week.  Started to develop chest pain or shortness of breath.  Some leg cramps/burning.  She had been on Eliquis in the past.  Was thought to have blood clots in the setting of OCPs.  History of hemochromatosis and no longer on blood thinners.  She actually restarted her Eliquis 5 mg twice a day on her own.     Past Medical History:  Diagnosis Date  . AMA (advanced maternal age) multigravida 35+   . Arrhythmia    Mobitz type 1  . Chicken pox   . Depression   . Depression with anxiety   . Frequent headaches   . Heart murmur    as a child  . Hemochromatosis, 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/2019  . Pulmonary embolism ([facility])   . Tachycardia    as a child  . Thyroid disease   . Urinary incontinence     Patient Active Problem List   Diagnosis Date Noted  . Hemochromatosis, 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/2019  . Frequent UTI 11/08/2017  . Prothrombin gene mutation ([facility]) 10/29/2017  . Bilateral pulmonary embolism ([facility]) 10/17/2017  . Thyroid disease 10/17/2017  . Depression with anxiety 10/17/2017  . Heart murmur 10/17/2017  . Heart palpitations 10/05/2016  . Second degree atrioventricular block, Mobitz (type) I   . IBS 07/19/2007  . 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 ABDOMINOPLASTY    . BREAST ENHANCEMENT SURGERY    . CESAREAN SECTION    . CESAREAN SECTION Bilateral 11/01/2016   Procedure: CESAREAN SECTION;  Surgeon: Levi Aland, MD;  Location: [facility];  Service: Obstetrics;  Laterality: Bilateral;  . KIDNEY SURGERY     congenital abnormality  of ureters  . MANDIBLE SURGERY    . TONSILLECTOMY    . TUBAL LIGATION       OB History    Gravida  6   Para  4   Term  3   Preterm  1   AB  2   Living  4     SAB  2   IAB      Ectopic      Multiple  0   Live Births  4           Family History  Problem Relation Age of Onset  . Diabetes Mother   . Arthritis Mother   . Depression Mother   . Stroke Mother   . Miscarriages / [place] Mother   . Mental illness Mother   . Hyperlipidemia Father   . Hypertension Father   . Alcohol abuse Father   . Arthritis Father   . Depression Father   . Drug abuse Father   . Hearing loss Father   .  Cancer Paternal Aunt   . Cancer Paternal Grandmother   . Ovarian cancer Paternal Grandmother   . Breast cancer Paternal Grandmother   . Mental illness Sister   . Asthma Brother   . Depression Brother   . Asthma Son   . Depression Maternal Grandmother   . Diabetes Maternal Grandmother   . Heart disease Maternal Grandfather   . Kidney disease Maternal Grandfather   . Stroke Maternal Grandfather   . Alcohol abuse Paternal Grandfather   . Asthma Paternal Grandfather   . Depression Paternal Grandfather   . Drug abuse Paternal Grandfather   . Hearing loss Paternal Grandfather   . Heart disease Paternal Grandfather   . Hyperlipidemia Paternal Grandfather   . Mental illness Paternal Grandfather     Social History   Tobacco Use  . Smoking status: Former Games developer  . Smokeless tobacco: Never Used  Vaping Use  . Vaping Use: Former  . Start date: 03/15/2014  . Quit date: 04/14/2016  Substance Use Topics  . Alcohol use: Yes     Comment: occ  . Drug use: No    Home Medications Prior to Admission medications   Medication Sig Start Date End Date Taking? Authorizing Provider  ADZENYS XR-ODT 18.8 MG TBED Take 1 tablet by mouth at bedtime. 09/23/19  Yes [provider]  nitrofurantoin, macrocrystal-monohydrate, (MACROBID) 100 MG capsule Take 100 mg by mouth as needed.    [provider]    Allergies    Sulfa antibiotics, Morphine, Morphine and related, and Erythromycin  Review of Systems   Review of Systems  Constitutional: Negative for chills and fever.  HENT: Negative for ear pain and sore throat.   Eyes: Negative for pain and visual disturbance.  Respiratory: Positive for shortness of breath. Negative for cough.   Cardiovascular: Positive for chest pain. Negative for palpitations.  Gastrointestinal: Negative for abdominal pain and vomiting.  Genitourinary: Negative for dysuria and hematuria.  Musculoskeletal: Negative for arthralgias and back pain.  Skin: Negative for color change and rash.  Neurological: Negative for seizures and syncope.  All other systems reviewed and are negative.   Physical Exam Updated Vital Signs BP 131/77   Pulse 79   Temp 98.4 F (36.9 C) (Oral)   Resp (!) 22   Ht 5\' 3"  (1.6 m)   Wt 55.8 kg   SpO2 100%   BMI 21.79 kg/m   Physical Exam Vitals and nursing note reviewed.  Constitutional:      General: She is not in acute distress.    Appearance: She is well-developed. She is not ill-appearing.  HENT:     Head: Normocephalic and atraumatic.  Eyes:     Extraocular Movements: Extraocular movements intact.     Conjunctiva/sclera: Conjunctivae normal.     Pupils: Pupils are equal, round, and reactive to light.  Cardiovascular:     Rate and Rhythm: Normal rate and regular rhythm.     Pulses:          Radial pulses are 2+ on the right side and 2+ on the left side.     Heart sounds: Normal heart sounds. No murmur heard.   Pulmonary:     Effort:  Pulmonary effort is normal. No respiratory distress.     Breath sounds: Normal breath sounds. No decreased breath sounds, wheezing or rhonchi.  Abdominal:     Palpations: Abdomen is soft.     Tenderness: There is no abdominal tenderness.  Musculoskeletal:  General: Normal range of motion.     Cervical back: Normal range of motion and neck supple.     Right lower leg: No edema.  Skin:    General: Skin is warm and dry.     Capillary Refill: Capillary refill takes less than 2 seconds.  Neurological:     General: No focal deficit present.     Mental Status: She is alert.     ED Results / Procedures / Treatments   Labs (all labs ordered are listed, but only abnormal results are displayed) Labs Reviewed  BASIC METABOLIC PANEL - Abnormal; Notable for the following components:      Result Value   Glucose, Bld 102 (*)    All other components within normal limits  CBC WITH DIFFERENTIAL/PLATELET  HEPATIC FUNCTION PANEL  LIPASE, BLOOD  TROPONIN I (HIGH SENSITIVITY)    EKG EKG Interpretation  Date/Time:  Tuesday July 13 2020 12:29:57 EDT Ventricular Rate:  95 PR Interval:    QRS Duration: 109 QT Interval:  379 QTC Calculation: 477 R Axis:   75 Text Interpretation: Sinus rhythm Right atrial enlargement Confirmed by Virgina Norfolk [phone]) on 07/13/2020 12:32:45 PM   Radiology CT Angio Chest PE W and/or Wo Contrast  Result Date: 07/13/2020 CLINICAL DATA:  Intermittent shortness of breath and chest pain. History of P. EXAM: CT ANGIOGRAPHY CHEST WITH CONTRAST TECHNIQUE: Multidetector CT imaging of the chest was performed using the standard protocol during bolus administration of intravenous contrast. Multiplanar CT image reconstructions and MIPs were obtained to evaluate the vascular anatomy. CONTRAST:  51mL OMNIPAQUE IOHEXOL 350 MG/ML SOLN COMPARISON:  10/03/2019 FINDINGS: Cardiovascular: Satisfactory opacification of the pulmonary arteries to the segmental level. No evidence of  pulmonary embolism. Thoracic aorta is normal in course and caliber. Normal heart size. No pericardial effusion. Mediastinum/Nodes: No axillary, mediastinal, or hilar lymphadenopathy. Trachea, thyroid, and esophagus within normal limits. Lungs/Pleura: Lungs are clear. No pleural effusion or pneumothorax. Upper Abdomen: No acute abnormality. Musculoskeletal: Bilateral breast prostheses. Osseous structures within normal limits. Review of the MIP images confirms the above findings. IMPRESSION: 1. Negative for pulmonary embolism. 2. Lungs are clear. Electronically Signed   By: Duanne Guess D.O.   On: 07/13/2020 14:47   DG Chest Portable 1 View  Result Date: 07/13/2020 CLINICAL DATA:  Chest pain.  Shortness of breath. EXAM: PORTABLE CHEST 1 VIEW COMPARISON:  CT 10/03/2019.  Chest x-ray 10/16/2017. FINDINGS: Mediastinum and hilar structures normal. Heart size normal. No focal infiltrate. No pleural effusion or pneumothorax. No acute bony abnormality. IMPRESSION: No acute cardiopulmonary disease. Electronically Signed   By: Maisie Fus  Register   On: 07/13/2020 13:11    Procedures Procedures   Medications Ordered in ED Medications  iohexol (OMNIPAQUE) 350 MG/ML injection 75 mL (75 mLs Intravenous Contrast Given 07/13/20 1415)    ED Course  I have reviewed the triage vital signs and the nursing notes.  Pertinent labs & imaging results that were available during my care of the patient were reviewed by me and considered in my medical decision making (see chart for details).    MDM Rules/Calculators/A&P                          Ashaunti H Mixon is here with shortness of breath, chest pain.  Normal vitals.  No fever.  EKG shows sinus rhythm.  No ischemic changes.  History of PEs in the past.  Started to take her Eliquis again several days ago when  she started noticing symptoms similar to her prior PEs.  Had been on Eliquis in the past but had been told to stop taking blood thinner a while back.  She recently  traveled back from [place].  Will evaluate for PE with CT scan.  Will check troponins.  Will check chest x-ray for infectious process.  Vital signs overall reassuring.  History concerning for PE but overall atypical sounding pain.  Doubt ACS.  Troponin negative.  Doubt ACS.  Atypical sounding story.  Not having active chest pain.  No significant anemia, electrolyte abnormality, kidney injury.  Chest x-ray and chest CT negative for pneumonia.  No pneumothorax.  No PE.  Overall possibly musculoskeletal process or GI process or nerve/anxiety.  Recommend rest and hydration at home.  Recommend close follow-up with primary care doctor symptoms persist.  Patient has been taking some doses of Eliquis however she has been taking lower than what would be necessary to treat for PE and overall given that it only been 2 or 3 days doubt that she had a blood clot that has now gone away.  She was given reassurance and discharged from ED in good condition.  This chart was dictated using voice recognition software.  Despite best efforts to proofread,  errors can occur which can change the documentation meaning.    Final Clinical Impression(s) / ED Diagnoses Final diagnoses:  Nonspecific chest pain    Rx / DC Orders ED Discharge Orders    None       Virgina NorfolkCuratolo, Adam, DO 07/13/20 1457

## 2020-09-30 ENCOUNTER — Other Ambulatory Visit: Payer: Self-pay

## 2020-09-30 ENCOUNTER — Ambulatory Visit (HOSPITAL_COMMUNITY)
Admission: EM | Admit: 2020-09-30 | Discharge: 2020-09-30 | Disposition: A | Discharge status: Home / Self Care | Payer: BLUE CROSS/BLUE SHIELD | Attending: Family Medicine | Admitting: Family Medicine

## 2020-09-30 ENCOUNTER — Encounter (HOSPITAL_COMMUNITY): Payer: Self-pay

## 2020-09-30 DIAGNOSIS — S81851A Open bite, right lower leg, initial encounter: Secondary | ICD-10-CM | POA: Diagnosis not present

## 2020-09-30 DIAGNOSIS — S81811A Laceration without foreign body, right lower leg, initial encounter: Secondary | ICD-10-CM

## 2020-09-30 DIAGNOSIS — W540XXA Bitten by dog, initial encounter: Secondary | ICD-10-CM

## 2020-09-30 MED ORDER — AMOXICILLIN-POT CLAVULANATE 875-125 MG PO TABS
1.0000 | ORAL_TABLET | Freq: Two times a day (BID) | ORAL | 0 refills | Status: DC
Start: 2020-09-30 — End: 2021-03-15

## 2020-09-30 MED ORDER — OXYCODONE-ACETAMINOPHEN 5-325 MG PO TABS: 1.0000 | ORAL_TABLET | Freq: Four times a day (QID) | ORAL | 0 refills | Status: DC | PRN

## 2020-09-30 MED ORDER — FLUCONAZOLE 150 MG PO TABS
ORAL_TABLET | ORAL | 0 refills | Status: DC
Start: 2020-09-30 — End: 2021-03-15

## 2020-09-30 NOTE — ED Triage Notes (Signed)
Pt was bitten by her dog about 2.5 hr ago on her left ankle. She states dog is up to date on shots. Pt states she took a shower and rinsed wound in shower. Bleeding controlled, 2 puncture wounds noted and several scratches.

## 2020-09-30 NOTE — ED Provider Notes (Signed)
[facility]   [id number] 09/30/20 Arrival Time: 1216  ASSESSMENT & PLAN:  1. Leg laceration, right, initial encounter   2. Dog bite of right lower leg, initial encounter    Declines repair of small superficial laceration.  Meds ordered this encounter  Medications   oxyCODONE-acetaminophen (PERCOCET/ROXICET) 5-325 MG tablet    Sig: Take 1 tablet by mouth every 6 (six) hours as needed for severe pain.    Dispense:  8 tablet    Refill:  0   amoxicillin-clavulanate (AUGMENTIN) 875-125 MG tablet    Sig: Take 1 tablet by mouth every 12 (twelve) hours.    Dispense:  14 tablet    Refill:  0   fluconazole (DIFLUCAN) 150 MG tablet    Sig: Take one tablet by mouth as a single dose. May repeat in 3 days if symptoms persist.    Dispense:  2 tablet    Refill:  0   Immunization History  Administered Date(s) Administered   Rabies, IM 03/19/2018, 03/22/2018, 03/26/2018, 04/02/2018   Tdap 08/09/2016   [place] Controlled Substances Registry consulted for this patient. I feel the risk/benefit ratio today is favorable for proceeding with this prescription for a controlled substance. Medication sedation precautions given.  WBAT.  Reviewed expectations re: course of current medical issues. Questions answered. Outlined signs and symptoms indicating need for more acute intervention. Patient verbalized understanding. After Visit Summary given.   SUBJECTIVE:  [name] [name] is a 45 y.o. female who reports dog bite to L ankle; her dog; UTD on shots. Accidental. Wounds are painful; bleeding controlled. Able to bear at with pain. No extremity sensation changes or weakness.   OBJECTIVE:  Vitals:   09/30/20 1335  BP: (!) 135/92  Pulse: 88  Resp: 18  Temp: 98.9 F (37.2 C)  SpO2: 100%    General appearance: alert; no distress LLE: approx 1 cm superficial laceration of inner L ankle; tender to touch; two small puncture wounds on lateral L ankle; mild bleeding; normal distal  sensation Psychological: alert and cooperative; normal mood and affect  Allergies  Allergen Reactions   Sulfa Antibiotics Anaphylaxis and Hives   Morphine Other (See Comments)    aggressiveness   Morphine And Related Itching and Other (See Comments)    hallucinations   Erythromycin Other (See Comments)    Unknown reaction from childhood    Past Medical History:  Diagnosis Date   AMA (advanced maternal age) multigravida 35+    Arrhythmia    Mobitz type 1   Chicken pox    Depression    Depression with anxiety    Frequent headaches    Heart murmur    as a child   Hemochromatosis, 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/2019   Pulmonary embolism (HCC)    Tachycardia    as a child   Thyroid disease    Urinary incontinence    Social History   Socioeconomic History   Marital status: Married    Spouse name: Not on file   Number of children: Not on file   Years of education: Not on file   Highest education level: Not on file  Occupational History   Not on file  Tobacco Use   Smoking status: Former    Pack years: 0.00   Smokeless tobacco: Never  Vaping Use   Vaping Use: Former   Start date: 03/15/2014   Quit date: 04/14/2016  Substance and Sexual Activity   Alcohol use: Yes    Comment: occ   Drug use:  No   Sexual activity: Yes    Partners: Male    Birth control/protection: Surgical  Other Topics Concern   Not on file  Social History Narrative   Married, college-educated.  Works in Air[employer].  4 children.   Social drinker.  Former smoker.   Exercises 3 times a week.   Smoke alarm in the home.  Wears her seatbelt.   Feels safe in her relationships.   Social Determinants of Health   Financial Resource Strain: Not on file  Food Insecurity: Not on file  Transportation Needs: Not on file  Physical Activity: Not on file  Stress: Not on file  Social Connections: Not on file   Family History  Problem Relation Age of Onset   Diabetes Mother    Arthritis Mother    Depression  Mother    Stroke Mother    Miscarriages / [place] Mother    Mental illness Mother    Hyperlipidemia Father    Hypertension Father    Alcohol abuse Father    Arthritis Father    Depression Father    Drug abuse Father    Hearing loss Father    Cancer Paternal Aunt    Cancer Paternal Grandmother    Ovarian cancer Paternal Grandmother    Breast cancer Paternal Grandmother    Mental illness Sister    Asthma Brother    Depression Brother    Asthma Son    Depression Maternal Grandmother    Diabetes Maternal Grandmother    Heart disease Maternal Grandfather    Kidney disease Maternal Grandfather    Stroke Maternal Grandfather    Alcohol abuse Paternal Grandfather    Asthma Paternal Grandfather    Depression Paternal Grandfather    Drug abuse Paternal Grandfather    Hearing loss Paternal Grandfather    Heart disease Paternal Grandfather    Hyperlipidemia Paternal Grandfather    Mental illness Paternal Grandfather    Past Surgical History:  Procedure Laterality Date   ABDOMINOPLASTY     BREAST ENHANCEMENT SURGERY     CESAREAN SECTION     CESAREAN SECTION Bilateral 11/01/2016   Procedure: CESAREAN SECTION;  Surgeon: Levi Aland, MD;  Location: [facility];  Service: Obstetrics;  Laterality: Bilateral;   KIDNEY SURGERY     congenital abnormality  of ureters   MANDIBLE SURGERY     TONSILLECTOMY     TUBAL LIGATION              Mardella Layman, MD 09/30/20 1520

## 2020-10-04 ENCOUNTER — Encounter: Payer: Self-pay | Admitting: Hematology & Oncology

## 2021-03-14 ENCOUNTER — Telehealth: Payer: Self-pay | Admitting: *Deleted

## 2021-03-14 ENCOUNTER — Encounter: Payer: Self-pay | Admitting: Family

## 2021-03-14 NOTE — Telephone Encounter (Signed)
Message received from patient stating that she needs to come in d/t elevated ferritin.  Message sent to scheduling to call pt to come in for labs and to see Eileen Stanford NP.  Pt's last appt at this office was 04/2019.

## 2021-03-15 ENCOUNTER — Other Ambulatory Visit: Payer: Self-pay

## 2021-03-15 ENCOUNTER — Inpatient Hospital Stay: Payer: BLUE CROSS/BLUE SHIELD | Attending: Family | Admitting: Family

## 2021-03-15 ENCOUNTER — Inpatient Hospital Stay: Payer: BLUE CROSS/BLUE SHIELD

## 2021-03-15 ENCOUNTER — Encounter: Payer: Self-pay | Admitting: Family

## 2021-03-15 DIAGNOSIS — I2699 Other pulmonary embolism without acute cor pulmonale: Secondary | ICD-10-CM | POA: Diagnosis not present

## 2021-03-15 DIAGNOSIS — D6852 Prothrombin gene mutation: Secondary | ICD-10-CM

## 2021-03-15 NOTE — Progress Notes (Signed)
Patient states she only had a little to eat before phlebotomy. Patient states she was feeling light headed at end of phlebotomy. Sweaty. BP 42/24, iv fluid of NS up at wide open, 1407 BP 83/53. 1412 88/51

## 2021-03-15 NOTE — Patient Instructions (Signed)

## 2021-03-15 NOTE — Progress Notes (Signed)
Hematology and Oncology Follow Up Visit  [name] [name] [id number] 1976/02/17 45 y.o. 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 twice daily-to complete 1 year of therapeutic dosing in July 2020   Current Therapy:        Phlebotomy to keep Ferritin < 100 and %Sat < 30   Interim History:  [name] [name] is here today for follow-up and phlebotomy. We last saw [name] [name] in January 2021.  Ferritin is 262, total iron 283 and iron saturation 31%.  [name] [name] last phlebotomy was in January 2020.  She is symptomatic with fatigue, headaches, lightheadedness, palpitations and cold chills.  She has intermittent numbness and tingling in [name] [name] hands and feet.  No fever, chills, n/v, cough, rash, SOB, chest pain, abdominal pain or changes in bowel or bladder habits.  No bruising or petechiae.  No swelling, tenderness, numbness or tingling in [name] [name] extremities.  No falls or syncope.  [name] [name] appetite is down and she has only been eating one meal at the end of the day. She is staying well hydrated. [name] [name] weight is stable at 116 lbs.   ECOG Performance Status: 1 - Symptomatic but completely ambulatory  Medications:  Allergies as of 03/15/2021       Reactions   Sulfa Antibiotics Anaphylaxis, Hives   Morphine Other (See Comments)   aggressiveness   Morphine And Related Itching, Other (See Comments)   hallucinations   Erythromycin Other (See Comments)   Unknown reaction from childhood        Medication List        Accurate as of March 15, 2021  1:33 PM. If you have any questions, ask your nurse or doctor.          STOP taking these medications    Adzenys XR-ODT 18.8 MG Tbed Generic drug: Amphetamine ER Stopped by: Eileen Stanford, NP   amoxicillin-clavulanate [phone] MG tablet Commonly known as: AUGMENTIN Stopped by:  Eileen Stanford, NP   fluconazole 150 MG tablet Commonly known as: Diflucan Stopped by: Eileen Stanford, NP   nitrofurantoin (macrocrystal-monohydrate) 100 MG capsule Commonly known as: MACROBID Stopped by: Eileen Stanford, NP   oxyCODONE-acetaminophen 5-325 MG tablet Commonly known as: PERCOCET/ROXICET Stopped by: Eileen Stanford, NP       TAKE these medications    Adderall 20 MG tablet Generic drug: amphetamine-dextroamphetamine Take 20 mg by mouth 2 (two) times daily.        Allergies:  Allergies  Allergen Reactions   Sulfa Antibiotics Anaphylaxis and Hives   Morphine Other (See Comments)    aggressiveness   Morphine And Related Itching and Other (See Comments)    hallucinations   Erythromycin Other (See Comments)    Unknown reaction from childhood    Past Medical History, Surgical history, Social history, and Family History were reviewed and updated.  Review of Systems: All other 10 point review of systems is negative.   Physical Exam:  height is 5\' 3"  (1.6 m) and weight is 116 lb (52.6 kg). [name] [name] oral temperature is 98.3 F (36.8 C). [name] [name] blood pressure is 120/81 and [name] [name] pulse is 92. [name] [name] respiration is 18 and oxygen saturation is 100%.   Wt Readings from Last 3 Encounters:  03/15/21 116 lb (52.6 kg)  07/13/20 123 lb (55.8 kg)  10/03/19 126 lb 3.2 oz (57.2 kg)    Ocular: Sclerae unicteric, pupils equal,  round and reactive to light Ear-nose-throat: Oropharynx clear, dentition fair Lymphatic: No cervical or supraclavicular adenopathy Lungs no rales or rhonchi, good excursion bilaterally Heart regular rate and rhythm, no murmur appreciated Abd soft, nontender, positive bowel sounds MSK no focal spinal tenderness, no joint edema Neuro: non-focal, well-oriented, appropriate affect Breasts: Deferred   Lab Results  Component Value Date   WBC 5.8 07/13/2020   HGB 13.3 07/13/2020   HCT 39.2 07/13/2020   MCV 97.5 07/13/2020   PLT 298 07/13/2020   Lab Results   Component Value Date   FERRITIN 116 05/09/2019   IRON 83 05/09/2019   TIBC 326 05/09/2019   UIBC 244 05/09/2019   IRONPCTSAT 25 05/09/2019   Lab Results  Component Value Date   RETICCTPCT 1.9 01/13/2019   RBC 4.02 07/13/2020   No results found for: KPAFRELGTCHN, LAMBDASER, [facility] Lab Results  Component Value Date   IGA 264 12/19/2006   No results found for: Marda Stalker, SPEI   Chemistry      Component Value Date/Time   NA 138 07/13/2020 1258   K 3.6 07/13/2020 1258   CL 103 07/13/2020 1258   CO2 26 07/13/2020 1258   BUN 15 07/13/2020 1258   CREATININE 0.75 07/13/2020 1258   CREATININE 0.82 05/09/2019 1353   CREATININE 0.86 10/16/2017 1454      Component Value Date/Time   CALCIUM 9.0 07/13/2020 1258   ALKPHOS 51 07/13/2020 1258   AST 20 07/13/2020 1258   AST 19 05/09/2019 1353   ALT 11 07/13/2020 1258   ALT 11 05/09/2019 1353   BILITOT 0.5 07/13/2020 1258   BILITOT 0.4 05/09/2019 1353       Impression and Plan: [name] [name] is a very pleasant 45 yo caucasian female heterozygous for the Prothrombin II gene mutation with bilateral pulmonary emboli. She also has hemochromatosis, heterozygote for H63D.  We will do a phlebotomy today and again next week.  Follow-up in 4 weeks.  She can contact our office with any questions or concerns.   Eileen Stanford, NP 11/22/20221:33 PM

## 2021-03-16 ENCOUNTER — Other Ambulatory Visit: Payer: Self-pay | Admitting: Specialist

## 2021-03-16 ENCOUNTER — Other Ambulatory Visit: Payer: Self-pay | Admitting: Obstetrics and Gynecology

## 2021-03-16 DIAGNOSIS — Z1231 Encounter for screening mammogram for malignant neoplasm of breast: Secondary | ICD-10-CM

## 2021-03-21 ENCOUNTER — Inpatient Hospital Stay: Payer: BLUE CROSS/BLUE SHIELD

## 2021-03-21 ENCOUNTER — Other Ambulatory Visit: Payer: Self-pay

## 2021-03-21 MED ORDER — SODIUM CHLORIDE 0.9 % IV SOLN: INTRAVENOUS | Status: DC

## 2021-03-21 NOTE — Progress Notes (Signed)
[name] [name] presents today for phlebotomy per MD orders. Phlebotomy procedure started at 1523 and ended at 1537 via 20 ga angio cath to left ac 525 grams removed.  IVF's ran at 999 ml/hr for 500 ml.'s per MD order.  Patient tolerated procedure well. IV needle removed intact.

## 2021-04-12 ENCOUNTER — Inpatient Hospital Stay: Payer: BLUE CROSS/BLUE SHIELD

## 2021-04-12 ENCOUNTER — Inpatient Hospital Stay: Payer: BLUE CROSS/BLUE SHIELD | Admitting: Family

## 2021-04-21 ENCOUNTER — Ambulatory Visit
Admission: RE | Admit: 2021-04-21 | Discharge: 2021-04-21 | Disposition: A | Discharge status: Home / Self Care | Payer: BLUE CROSS/BLUE SHIELD | Source: Ambulatory Visit | Attending: Obstetrics and Gynecology | Admitting: Obstetrics and Gynecology

## 2021-04-21 ENCOUNTER — Encounter: Payer: Self-pay | Admitting: Hematology & Oncology

## 2021-04-21 DIAGNOSIS — Z1231 Encounter for screening mammogram for malignant neoplasm of breast: Secondary | ICD-10-CM

## 2021-04-29 ENCOUNTER — Encounter: Payer: Self-pay | Admitting: Family

## 2021-04-29 ENCOUNTER — Encounter: Payer: Self-pay | Admitting: *Deleted

## 2021-04-29 ENCOUNTER — Inpatient Hospital Stay: Payer: BLUE CROSS/BLUE SHIELD | Attending: Family

## 2021-04-29 ENCOUNTER — Inpatient Hospital Stay: Payer: BLUE CROSS/BLUE SHIELD | Admitting: Family

## 2021-04-29 ENCOUNTER — Inpatient Hospital Stay: Payer: BLUE CROSS/BLUE SHIELD

## 2021-04-29 ENCOUNTER — Telehealth: Payer: Self-pay | Admitting: *Deleted

## 2021-04-29 ENCOUNTER — Other Ambulatory Visit: Payer: Self-pay

## 2021-04-29 DIAGNOSIS — I2699 Other pulmonary embolism without acute cor pulmonale: Secondary | ICD-10-CM | POA: Diagnosis not present

## 2021-04-29 DIAGNOSIS — D6852 Prothrombin gene mutation: Secondary | ICD-10-CM

## 2021-04-29 LAB — CBC WITH DIFFERENTIAL (CANCER CENTER ONLY)
Abs Immature Granulocytes: 0.01 10*3/uL (ref 0.00–0.07)
Basophils Absolute: 0 10*3/uL (ref 0.0–0.1)
Basophils Relative: 0 %
Eosinophils Absolute: 0.1 10*3/uL (ref 0.0–0.5)
Eosinophils Relative: 1 %
HCT: 37.9 % (ref 36.0–46.0)
Hemoglobin: 12.9 g/dL (ref 12.0–15.0)
Immature Granulocytes: 0 %
Lymphocytes Relative: 21 %
Lymphs Abs: 1.2 10*3/uL (ref 0.7–4.0)
MCH: 33.8 pg (ref 26.0–34.0)
MCHC: 34 g/dL (ref 30.0–36.0)
MCV: 99.2 fL (ref 80.0–100.0)
Monocytes Absolute: 0.4 10*3/uL (ref 0.1–1.0)
Monocytes Relative: 7 %
Neutro Abs: 4.1 10*3/uL (ref 1.7–7.7)
Neutrophils Relative %: 71 %
Platelet Count: 283 10*3/uL (ref 150–400)
RBC: 3.82 MIL/uL — ABNORMAL LOW (ref 3.87–5.11)
RDW: 12 % (ref 11.5–15.5)
WBC Count: 5.8 10*3/uL (ref 4.0–10.5)
nRBC: 0 % (ref 0.0–0.2)

## 2021-04-29 LAB — CMP (CANCER CENTER ONLY)
ALT: 10 U/L (ref 0–44)
AST: 17 U/L (ref 15–41)
Albumin: 4.5 g/dL (ref 3.5–5.0)
Alkaline Phosphatase: 55 U/L (ref 38–126)
Anion gap: 8 (ref 5–15)
BUN: 15 mg/dL (ref 6–20)
CO2: 28 mmol/L (ref 22–32)
Calcium: 9.9 mg/dL (ref 8.9–10.3)
Chloride: 103 mmol/L (ref 98–111)
Creatinine: 0.95 mg/dL (ref 0.44–1.00)
GFR, Estimated: >60 mL/min (ref >60)
Glucose, Bld: 117 mg/dL — ABNORMAL HIGH (ref 70–99)
Potassium: 3.8 mmol/L (ref 3.5–5.1)
Sodium: 139 mmol/L (ref 135–145)
Total Bilirubin: 0.5 mg/dL (ref 0.3–1.2)
Total Protein: 7.1 g/dL (ref 6.5–8.1)

## 2021-04-29 LAB — FERRITIN: Ferritin: 63 ng/mL (ref 11–307)

## 2021-04-29 LAB — IRON AND IRON BINDING CAPACITY (CC-WL,HP ONLY)
Iron: 123 ug/dL (ref 28–170)
Saturation Ratios: 36 % — ABNORMAL HIGH (ref 10.4–31.8)
TIBC: 342 ug/dL (ref 250–450)
UIBC: 219 ug/dL (ref 148–442)

## 2021-04-29 MED ORDER — SODIUM CHLORIDE 0.9 % IV SOLN: INTRAVENOUS | Status: DC

## 2021-04-29 NOTE — Progress Notes (Signed)
[name] [name] presents today for phlebotomy per MD orders. Phlebotomy procedure started at 0914 and ended at 0924. 510 grams removed from lt AC using 18g IV cath.  Patient observed for 30 minutes after procedure without any incident. Patient tolerated procedure well. IV needle removed intact.

## 2021-04-29 NOTE — Progress Notes (Signed)
Hematology and Oncology Follow Up Visit  [name] [name] [id number] 24-Apr-1976 46 y.o. 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 twice daily-to complete 1 year of therapeutic dosing in July 2020   Current Therapy:        Phlebotomy to keep Ferritin < 100 and %Sat < 30   Interim History:  [name] [name] is here today for follow-up. She is recuperating from having both Covid and the flu at the same time recently.  She still has fatigue and mild SOB with exertion.  No fever, chills, n/v, cough, rash, dizziness, chest pain, palpitations, abdominal pain or changes in bowel or bladder habits.  She notes an increase in joint pain, headaches and numbness and tingling in her hands and feet which she states is the case when her iron counts are up. She rescheduled her previous phlebotomy appointment to today.  No swelling in her extremities at this time.  No falls or syncope reported.  She does not have much of an appetite at this time. She is doing her best to stay well hydrated. Her weight is 116 lbs.   ECOG Performance Status: 1 - Symptomatic but completely ambulatory  Medications:  Allergies as of 04/29/2021       Reactions   Sulfa Antibiotics Anaphylaxis, Hives   Morphine Other (See Comments)   aggressiveness   Morphine And Related Itching, Other (See Comments)   hallucinations   Erythromycin Other (See Comments)   Unknown reaction from childhood        Medication List        Accurate as of April 29, 2021  8:59 AM. If you have any questions, ask your nurse or doctor.          amphetamine-dextroamphetamine 20 MG tablet Commonly known as: ADDERALL Take 20 mg by mouth 2 (two) times daily.        Allergies:  Allergies  Allergen Reactions   Sulfa Antibiotics Anaphylaxis and Hives   Morphine  Other (See Comments)    aggressiveness   Morphine And Related Itching and Other (See Comments)    hallucinations   Erythromycin Other (See Comments)    Unknown reaction from childhood    Past Medical History, Surgical history, Social history, and Family History were reviewed and updated.  Review of Systems: All other 10 point review of systems is negative.   Physical Exam:  height is 5\' 3"  (1.6 m) and weight is 116 lb (52.6 kg). Her oral temperature is 98.3 F (36.8 C). Her blood pressure is 120/63 and her pulse is 108 (abnormal). Her respiration is 18 and oxygen saturation is 100%.   Wt Readings from Last 3 Encounters:  04/29/21 116 lb (52.6 kg)  03/15/21 116 lb (52.6 kg)  07/13/20 123 lb (55.8 kg)    Ocular: Sclerae unicteric, pupils equal, round and reactive to light Ear-nose-throat: Oropharynx clear, dentition fair Lymphatic: No cervical or supraclavicular adenopathy Lungs no rales or rhonchi, good excursion bilaterally Heart regular rate and rhythm, no murmur appreciated Abd soft, nontender, positive bowel sounds MSK no focal spinal tenderness, no joint edema Neuro: non-focal, well-oriented, appropriate affect Breasts: Deferred   Lab Results  Component Value Date   WBC 5.8 04/29/2021   HGB 12.9 04/29/2021   HCT 37.9 04/29/2021   MCV 99.2 04/29/2021   PLT 283 04/29/2021   Lab Results  Component  Value Date   FERRITIN 116 05/09/2019   IRON 83 05/09/2019   TIBC 326 05/09/2019   UIBC 244 05/09/2019   IRONPCTSAT 25 05/09/2019   Lab Results  Component Value Date   RETICCTPCT 1.9 01/13/2019   RBC 3.82 (L) 04/29/2021   No results found for: [facility] Lab Results  Component Value Date   IGA 264 12/19/2006   No results found for: Marda Stalker, SPEI   Chemistry      Component Value Date/Time   NA 139 04/29/2021 0822   K 3.8 04/29/2021 0822   CL 103 04/29/2021 0822   CO2 28  04/29/2021 0822   BUN 15 04/29/2021 0822   CREATININE 0.95 04/29/2021 0822   CREATININE 0.86 10/16/2017 1454      Component Value Date/Time   CALCIUM 9.9 04/29/2021 0822   ALKPHOS 55 04/29/2021 0822   AST 17 04/29/2021 0822   ALT 10 04/29/2021 0822   BILITOT 0.5 04/29/2021 4270       Impression and Plan: [name] [name] is a very pleasant 46 yo caucasian female heterozygous for the Prothrombin II gene mutation with bilateral pulmonary emboli. She also has hemochromatosis, heterozygote for H63D.  Phlebotomy today as planned.  Iron studies pending. Will schedule another phlebotomy next week if needed.  Lab only in 4 weeks and follow-up in 8 weeks.   Eileen Stanford, NP 1/6/20238:59 AM

## 2021-04-29 NOTE — Telephone Encounter (Signed)
Per 04/29/21 los - called and gave upcoming appointments - confirmed °

## 2021-04-29 NOTE — Patient Instructions (Signed)

## 2021-05-27 ENCOUNTER — Other Ambulatory Visit: Payer: Self-pay

## 2021-05-27 ENCOUNTER — Inpatient Hospital Stay: Payer: BLUE CROSS/BLUE SHIELD | Attending: Family

## 2021-05-27 DIAGNOSIS — I2699 Other pulmonary embolism without acute cor pulmonale: Secondary | ICD-10-CM

## 2021-05-27 DIAGNOSIS — D6852 Prothrombin gene mutation: Secondary | ICD-10-CM

## 2021-05-27 LAB — CBC WITH DIFFERENTIAL (CANCER CENTER ONLY)
Abs Immature Granulocytes: 0.02 10*3/uL (ref 0.00–0.07)
Basophils Absolute: 0 10*3/uL (ref 0.0–0.1)
Basophils Relative: 0 %
Eosinophils Absolute: 0 10*3/uL (ref 0.0–0.5)
Eosinophils Relative: 1 %
HCT: 38.9 % (ref 36.0–46.0)
Hemoglobin: 12.9 g/dL (ref 12.0–15.0)
Immature Granulocytes: 0 %
Lymphocytes Relative: 27 %
Lymphs Abs: 1.5 10*3/uL (ref 0.7–4.0)
MCH: 33 pg (ref 26.0–34.0)
MCHC: 33.2 g/dL (ref 30.0–36.0)
MCV: 99.5 fL (ref 80.0–100.0)
Monocytes Absolute: 0.3 10*3/uL (ref 0.1–1.0)
Monocytes Relative: 5 %
Neutro Abs: 3.7 10*3/uL (ref 1.7–7.7)
Neutrophils Relative %: 67 %
Platelet Count: 405 10*3/uL — ABNORMAL HIGH (ref 150–400)
RBC: 3.91 MIL/uL (ref 3.87–5.11)
RDW: 11.9 % (ref 11.5–15.5)
WBC Count: 5.5 10*3/uL (ref 4.0–10.5)
nRBC: 0 % (ref 0.0–0.2)

## 2021-05-27 LAB — CMP (CANCER CENTER ONLY)
ALT: 12 U/L (ref 0–44)
AST: 16 U/L (ref 15–41)
Albumin: 4.5 g/dL (ref 3.5–5.0)
Alkaline Phosphatase: 65 U/L (ref 38–126)
Anion gap: 8 (ref 5–15)
BUN: 14 mg/dL (ref 6–20)
CO2: 27 mmol/L (ref 22–32)
Calcium: 9.7 mg/dL (ref 8.9–10.3)
Chloride: 102 mmol/L (ref 98–111)
Creatinine: 0.87 mg/dL (ref 0.44–1.00)
GFR, Estimated: >60 mL/min (ref >60)
Glucose, Bld: 110 mg/dL — ABNORMAL HIGH (ref 70–99)
Potassium: 4.2 mmol/L (ref 3.5–5.1)
Sodium: 137 mmol/L (ref 135–145)
Total Bilirubin: 0.5 mg/dL (ref 0.3–1.2)
Total Protein: 7.5 g/dL (ref 6.5–8.1)

## 2021-05-27 LAB — IRON AND IRON BINDING CAPACITY (CC-WL,HP ONLY)
Iron: 142 ug/dL (ref 28–170)
Saturation Ratios: 36 % — ABNORMAL HIGH (ref 10.4–31.8)
TIBC: 395 ug/dL (ref 250–450)
UIBC: 253 ug/dL (ref 148–442)

## 2021-05-27 LAB — FERRITIN: Ferritin: 28 ng/mL (ref 11–307)

## 2021-06-24 ENCOUNTER — Inpatient Hospital Stay: Payer: BLUE CROSS/BLUE SHIELD | Attending: Family

## 2021-06-24 ENCOUNTER — Inpatient Hospital Stay: Payer: BLUE CROSS/BLUE SHIELD | Admitting: Family

## 2021-06-24 ENCOUNTER — Encounter: Payer: Self-pay | Admitting: Family

## 2021-06-24 ENCOUNTER — Other Ambulatory Visit: Payer: Self-pay

## 2021-06-24 DIAGNOSIS — I2699 Other pulmonary embolism without acute cor pulmonale: Secondary | ICD-10-CM

## 2021-06-24 DIAGNOSIS — D6852 Prothrombin gene mutation: Secondary | ICD-10-CM | POA: Diagnosis not present

## 2021-06-24 DIAGNOSIS — Z7901 Long term (current) use of anticoagulants: Secondary | ICD-10-CM | POA: Diagnosis not present

## 2021-06-24 LAB — CMP (CANCER CENTER ONLY)
ALT: 13 U/L (ref 0–44)
AST: 21 U/L (ref 15–41)
Albumin: 4.2 g/dL (ref 3.5–5.0)
Alkaline Phosphatase: 55 U/L (ref 38–126)
Anion gap: 9 (ref 5–15)
BUN: 15 mg/dL (ref 6–20)
CO2: 29 mmol/L (ref 22–32)
Calcium: 9.5 mg/dL (ref 8.9–10.3)
Chloride: 102 mmol/L (ref 98–111)
Creatinine: 0.87 mg/dL (ref 0.44–1.00)
GFR, Estimated: >60 mL/min (ref >60)
Glucose, Bld: 151 mg/dL — ABNORMAL HIGH (ref 70–99)
Potassium: 3.9 mmol/L (ref 3.5–5.1)
Sodium: 140 mmol/L (ref 135–145)
Total Bilirubin: 0.8 mg/dL (ref 0.3–1.2)
Total Protein: 7.3 g/dL (ref 6.5–8.1)

## 2021-06-24 LAB — CBC WITH DIFFERENTIAL (CANCER CENTER ONLY)
Abs Immature Granulocytes: 0.02 10*3/uL (ref 0.00–0.07)
Basophils Absolute: 0 10*3/uL (ref 0.0–0.1)
Basophils Relative: 0 %
Eosinophils Absolute: 0 10*3/uL (ref 0.0–0.5)
Eosinophils Relative: 1 %
HCT: 39.8 % (ref 36.0–46.0)
Hemoglobin: 13.2 g/dL (ref 12.0–15.0)
Immature Granulocytes: 0 %
Lymphocytes Relative: 27 %
Lymphs Abs: 1.3 10*3/uL (ref 0.7–4.0)
MCH: 32.5 pg (ref 26.0–34.0)
MCHC: 33.2 g/dL (ref 30.0–36.0)
MCV: 98 fL (ref 80.0–100.0)
Monocytes Absolute: 0.3 10*3/uL (ref 0.1–1.0)
Monocytes Relative: 5 %
Neutro Abs: 3.2 10*3/uL (ref 1.7–7.7)
Neutrophils Relative %: 67 %
Platelet Count: 298 10*3/uL (ref 150–400)
RBC: 4.06 MIL/uL (ref 3.87–5.11)
RDW: 12.2 % (ref 11.5–15.5)
WBC Count: 4.8 10*3/uL (ref 4.0–10.5)
nRBC: 0 % (ref 0.0–0.2)

## 2021-06-24 LAB — IRON AND IRON BINDING CAPACITY (CC-WL,HP ONLY)
Iron: 144 ug/dL (ref 28–170)
Saturation Ratios: 37 % — ABNORMAL HIGH (ref 10.4–31.8)
TIBC: 385 ug/dL (ref 250–450)
UIBC: 241 ug/dL (ref 148–442)

## 2021-06-24 LAB — FERRITIN: Ferritin: 51 ng/mL (ref 11–307)

## 2021-06-24 NOTE — Progress Notes (Signed)
?Hematology and Oncology Follow Up Visit ? ?[name] [name] ?[id number] ?10-03-1975 46 y.o. ?06/24/2021 ? ? ?Principle Diagnosis:  ?Heterozygous Prothrombin II gene mutation ?Bilateral pulmonary emboli - on oral contraceptives ?Multiple miscarriages ?Hemochromatosis -- heterozygote for H63D ?  ?Past Therapy: ?Eliquis 5 mg p.o. twice daily-to complete 1 year of therapeutic dosing in July 2020 ?  ?Current Therapy:        ?Phlebotomy to keep Ferritin < 100 and %Sat < 30 ?  ?Interim History:  [name] [name] is here today for follow-up. She is having issues with pain in her joints and bones. She feels fatigued and notes burning and tingling off and on in her hands and feet.  ?She had a possible cyst noted in her uterus and will he having a sonohystegram  soon to better evaluate.  ?She started having vaginal blood loss that was thick and dark after not having a cycle for several years after having her tubes tied and an ablation. She is having abdominal cramps as well.  ?No fever, chills, n/v, cough, rash, dizziness, SOB, chest pain, palpitations or changes in bowel or bladder habits.  ?No falls or syncope.  ?Her appetite is ok and she is doing her best to stay well hydrated. Her weight is stable at 117 lbs.  ? ?ECOG Performance Status: 1 - Symptomatic but completely ambulatory ? ?Medications:  ?Allergies as of 06/24/2021   ? ?   Reactions  ? Sulfa Antibiotics Anaphylaxis, Hives  ? Morphine Other (See Comments)  ? aggressiveness  ? Morphine And Related Itching, Other (See Comments)  ? hallucinations  ? Erythromycin Other (See Comments)  ? Unknown reaction from childhood  ? ?  ? ?  ?Medication List  ?  ? ?  ? Accurate as of June 24, 2021  8:53 AM. If you have any questions, ask your nurse or doctor.  ?  ?  ? ?  ? ?amphetamine-dextroamphetamine 20 MG tablet ?Commonly known as: ADDERALL ?Take 20 mg by mouth 2 (two) times daily. ?  ? ?  ? ? ?Allergies:  ?Allergies  ?Allergen Reactions  ? Sulfa Antibiotics Anaphylaxis and Hives  ? Morphine  Other (See Comments)  ?  aggressiveness  ? Morphine And Related Itching and Other (See Comments)  ?  hallucinations  ? Erythromycin Other (See Comments)  ?  Unknown reaction from childhood  ? ? ?Past Medical History, Surgical history, Social history, and Family History were reviewed and updated. ? ?Review of Systems: ?All other 10 point review of systems is negative.  ? ?Physical Exam: ? vitals were not taken for this visit.  ? ?Wt Readings from Last 3 Encounters:  ?04/29/21 116 lb (52.6 kg)  ?03/15/21 116 lb (52.6 kg)  ?07/13/20 123 lb (55.8 kg)  ? ? ?Ocular: Sclerae unicteric, pupils equal, round and reactive to light ?Ear-nose-throat: Oropharynx clear, dentition fair ?Lymphatic: No cervical or supraclavicular adenopathy ?Lungs no rales or rhonchi, good excursion bilaterally ?Heart regular rate and rhythm, no murmur appreciated ?Abd soft, nontender, positive bowel sounds ?MSK no focal spinal tenderness, no joint edema ?Neuro: non-focal, well-oriented, appropriate affect ?Breasts: Ferritin  ? ?Lab Results  ?Component Value Date  ? WBC 5.5 05/27/2021  ? HGB 12.9 05/27/2021  ? HCT 38.9 05/27/2021  ? MCV 99.5 05/27/2021  ? PLT 405 (H) 05/27/2021  ? ?Lab Results  ?Component Value Date  ? FERRITIN 28 05/27/2021  ? IRON 142 05/27/2021  ? TIBC 395 05/27/2021  ? UIBC 253 05/27/2021  ? IRONPCTSAT 36 (H) 05/27/2021  ? ?  Lab Results  ?Component Value Date  ? RETICCTPCT 1.9 01/13/2019  ? RBC 3.91 05/27/2021  ? ?No results found for: KPAFRELGTCHN, LAMBDASER, KAPLAMBRATIO ?Lab Results  ?Component Value Date  ? IGA 264 12/19/2006  ? ?No results found for: TOTALPROTELP, ALBUMINELP, A1GS, A2GS, BETS, BETA2SER, GAMS, MSPIKE, SPEI ?  Chemistry   ?   ?Component Value Date/Time  ? NA 137 05/27/2021 0859  ? K 4.2 05/27/2021 0859  ? CL 102 05/27/2021 0859  ? CO2 27 05/27/2021 0859  ? BUN 14 05/27/2021 0859  ? CREATININE 0.87 05/27/2021 0859  ? CREATININE 0.86 10/16/2017 1454  ?    ?Component Value Date/Time  ? CALCIUM 9.7 05/27/2021 0859   ? ALKPHOS 65 05/27/2021 0859  ? AST 16 05/27/2021 0859  ? ALT 12 05/27/2021 0859  ? BILITOT 0.5 05/27/2021 0859  ?  ? ? ? ?Impression and Plan: Ms. [name] is a very pleasant 46 yo caucasian female heterozygous for the Prothrombin II gene mutation with bilateral pulmonary emboli. She also has hemochromatosis, heterozygote for H63D.  ?We will see what her iron studies look like and get her back in for phlebotomy if needed.  ?Follow-up in 8 weeks.  ? ?Eileen Stanford, NP ?3/3/20238:53 AM ? ?

## 2021-11-21 ENCOUNTER — Telehealth: Payer: Self-pay | Admitting: Family

## 2021-11-21 NOTE — Telephone Encounter (Signed)
Called to schedule per 7/31 sch msg, left voicemail

## 2021-11-22 ENCOUNTER — Telehealth: Payer: Self-pay | Admitting: *Deleted

## 2021-11-22 NOTE — Telephone Encounter (Signed)
Per scheduling message [name] [name] - called a lvm for a call back to schedule labs only

## 2021-11-30 ENCOUNTER — Inpatient Hospital Stay: Payer: BLUE CROSS/BLUE SHIELD | Attending: Hematology & Oncology

## 2021-11-30 ENCOUNTER — Other Ambulatory Visit: Payer: Self-pay

## 2021-11-30 ENCOUNTER — Inpatient Hospital Stay: Payer: BLUE CROSS/BLUE SHIELD | Admitting: Family

## 2021-11-30 ENCOUNTER — Encounter: Payer: Self-pay | Admitting: Family

## 2021-11-30 DIAGNOSIS — D6852 Prothrombin gene mutation: Secondary | ICD-10-CM | POA: Diagnosis present

## 2021-11-30 DIAGNOSIS — I2699 Other pulmonary embolism without acute cor pulmonale: Secondary | ICD-10-CM

## 2021-11-30 DIAGNOSIS — Z7901 Long term (current) use of anticoagulants: Secondary | ICD-10-CM | POA: Diagnosis not present

## 2021-11-30 LAB — CBC WITH DIFFERENTIAL (CANCER CENTER ONLY)
Abs Immature Granulocytes: 0.02 10*3/uL (ref 0.00–0.07)
Basophils Absolute: 0 10*3/uL (ref 0.0–0.1)
Basophils Relative: 0 %
Eosinophils Absolute: 0 10*3/uL (ref 0.0–0.5)
Eosinophils Relative: 0 %
HCT: 38.8 % (ref 36.0–46.0)
Hemoglobin: 12.7 g/dL (ref 12.0–15.0)
Immature Granulocytes: 0 %
Lymphocytes Relative: 18 %
Lymphs Abs: 1.1 10*3/uL (ref 0.7–4.0)
MCH: 34.1 pg — ABNORMAL HIGH (ref 26.0–34.0)
MCHC: 32.7 g/dL (ref 30.0–36.0)
MCV: 104.3 fL — ABNORMAL HIGH (ref 80.0–100.0)
Monocytes Absolute: 0.3 10*3/uL (ref 0.1–1.0)
Monocytes Relative: 4 %
Neutro Abs: 4.9 10*3/uL (ref 1.7–7.7)
Neutrophils Relative %: 78 %
Platelet Count: 305 10*3/uL (ref 150–400)
RBC: 3.72 MIL/uL — ABNORMAL LOW (ref 3.87–5.11)
RDW: 12.5 % (ref 11.5–15.5)
WBC Count: 6.3 10*3/uL (ref 4.0–10.5)
nRBC: 0 % (ref 0.0–0.2)

## 2021-11-30 LAB — CMP (CANCER CENTER ONLY)
ALT: 15 U/L (ref 0–44)
AST: 23 U/L (ref 15–41)
Albumin: 4.7 g/dL (ref 3.5–5.0)
Alkaline Phosphatase: 46 U/L (ref 38–126)
Anion gap: 11 (ref 5–15)
BUN: 10 mg/dL (ref 6–20)
CO2: 27 mmol/L (ref 22–32)
Calcium: 9.7 mg/dL (ref 8.9–10.3)
Chloride: 104 mmol/L (ref 98–111)
Creatinine: 0.87 mg/dL (ref 0.44–1.00)
GFR, Estimated: >60 mL/min (ref >60)
Glucose, Bld: 108 mg/dL — ABNORMAL HIGH (ref 70–99)
Potassium: 3.7 mmol/L (ref 3.5–5.1)
Sodium: 142 mmol/L (ref 135–145)
Total Bilirubin: 0.4 mg/dL (ref 0.3–1.2)
Total Protein: 7.2 g/dL (ref 6.5–8.1)

## 2021-11-30 LAB — FERRITIN: Ferritin: 63 ng/mL (ref 11–307)

## 2021-11-30 NOTE — Progress Notes (Signed)
Hematology and Oncology Follow Up Visit  [name] [name] [id number] June 16, 1975 46 y.o. 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 twice daily-to complete 1 year of therapeutic dosing in July 2020   Current Therapy:        Phlebotomy to keep Ferritin < 100 and %Sat < 30   Interim History:  [name] [name] is here today for follow-up. She is having stinging pain and itching all over that comes and goes. She recently started Cymbalta and gabapentin which has helped reduce her symptoms but they are still present. She has an appointment in mid September with neurology for further eval.  She is feeling fatigued and has not been sleeping well.  No fever, chills, n/v, cough, dizziness, SOB, chest pain, palpitations, abdominal pain or changes in bowel or bladder habits.  No blood loss noted. No abnormal bruising, no petechiae.  Appetite is down but she is doing well staying hydrated throughout the day. Her weight is stable at 119 lbs.  She has occasional swelling in her hands and feet. This comes and goes.  No falls or syncope to report.    ECOG Performance Status: 1 - Symptomatic but completely ambulatory  Medications:  Allergies as of 11/30/2021       Reactions   Sulfa Antibiotics Anaphylaxis, Hives   Morphine Other (See Comments)   aggressiveness   Morphine And Related Itching, Other (See Comments)   hallucinations   Erythromycin Other (See Comments)   Unknown reaction from childhood        Medication List        Accurate as of November 30, 2021  2:51 PM. If you have any questions, ask your nurse or doctor.          amphetamine-dextroamphetamine 20 MG tablet Commonly known as: ADDERALL Take 20 mg by mouth 2 (two) times daily.   CYMBALTA PO TAKE ONE CAPSULE FOR ONE WEEK THEN INCREASE TO 2  CAPSULES.   gabapentin 100 MG capsule Commonly known as: NEURONTIN TAKE ONE CAPSULE FOR ONE WEEK AND THEN INCREASE TO 2 CAPSULES AT BEDTIME.   norethindrone 0.35 MG tablet Commonly known as: MICRONOR Take 1 tablet by mouth daily.        Allergies:  Allergies  Allergen Reactions   Sulfa Antibiotics Anaphylaxis and Hives   Morphine Other (See Comments)    aggressiveness   Morphine And Related Itching and Other (See Comments)    hallucinations   Erythromycin Other (See Comments)    Unknown reaction from childhood    Past Medical History, Surgical history, Social history, and Family History were reviewed and updated.  Review of Systems: All other 10 point review of systems is negative.   Physical Exam:  height is 5' 3.5" (1.613 m) and weight is 119 lb (54 kg). Her oral temperature is 98.3 F (36.8 C). Her blood pressure is 113/71 and her pulse is 95. Her respiration is 18 and oxygen saturation is 100%.   Wt Readings from Last 3 Encounters:  11/30/21 119 lb (54 kg)  06/24/21 117 lb 12.8 oz (53.4 kg)  04/29/21 116 lb (52.6 kg)    Ocular: Sclerae unicteric, pupils equal, round and reactive to light Ear-nose-throat: Oropharynx clear, dentition fair Lymphatic: No cervical or supraclavicular adenopathy Lungs no rales or rhonchi, good excursion bilaterally Heart regular rate and rhythm, no murmur appreciated Abd soft,  nontender, positive bowel sounds MSK no focal spinal tenderness, no joint edema Neuro: non-focal, well-oriented, appropriate affect Breasts: Deferred   Lab Results  Component Value Date   WBC 6.3 11/30/2021   HGB 12.7 11/30/2021   HCT 38.8 11/30/2021   MCV 104.3 (H) 11/30/2021   PLT 305 11/30/2021   Lab Results  Component Value Date   FERRITIN 51 06/24/2021   IRON 144 06/24/2021   TIBC 385 06/24/2021   UIBC 241 06/24/2021   IRONPCTSAT 37 (H) 06/24/2021   Lab Results  Component Value Date   RETICCTPCT 1.9 01/13/2019   RBC 3.72 (L) 11/30/2021    No results found for: "KPAFRELGTCHN", "LAMBDASER", "KAPLAMBRATIO" Lab Results  Component Value Date   IGA 264 12/19/2006   No results found for: "TOTALPROTELP", "ALBUMINELP", "A1GS", "A2GS", "BETS", "BETA2SER", "GAMS", "MSPIKE", "SPEI"   Chemistry      Component Value Date/Time   NA 140 06/24/2021 0846   K 3.9 06/24/2021 0846   CL 102 06/24/2021 0846   CO2 29 06/24/2021 0846   BUN 15 06/24/2021 0846   CREATININE 0.87 06/24/2021 0846   CREATININE 0.86 10/16/2017 1454      Component Value Date/Time   CALCIUM 9.5 06/24/2021 0846   ALKPHOS 55 06/24/2021 0846   AST 21 06/24/2021 0846   ALT 13 06/24/2021 0846   BILITOT 0.8 06/24/2021 0846       Impression and Plan: [name] [name] is a very pleasant 46 yo caucasian female heterozygous for the Prothrombin II gene mutation with bilateral pulmonary emboli. She also has hemochromatosis, heterozygote for H63D.  Iron studies are pending. We will set her up for phlebotomy if needed.  Follow-up in 3 months.   Eileen Stanford, NP 8/9/20232:51 PM

## 2021-12-01 ENCOUNTER — Telehealth: Payer: Self-pay | Admitting: *Deleted

## 2021-12-01 LAB — IRON AND IRON BINDING CAPACITY (CC-WL,HP ONLY)
Iron: 119 ug/dL (ref 28–170)
Saturation Ratios: 33 % — ABNORMAL HIGH (ref 10.4–31.8)
TIBC: 357 ug/dL (ref 250–450)
UIBC: 238 ug/dL (ref 148–442)

## 2021-12-01 NOTE — Telephone Encounter (Signed)
Per 11/30/21 los - Called patient and gave upcoming appointments - confirmed

## 2022-01-13 NOTE — Progress Notes (Unsigned)
NEUROLOGY FOLLOW UP OFFICE NOTE  [name] [name] [id number]  Assessment/Plan:   Recurrent prolonged episodes of various symptomatology, including paresthesias, numbness, neuralgia, myalgia, polyarthralgia, weakness.   Symptoms seem more consistent with some underlying autoimmune disorder although workup so far unrevealing.  This does not represent multiple sclerosis or other CNS etiology  Will check NCV-EMG.  Further recommendations pending results. Will obtain labs from rheumatology  Subjective:  [name] [name] is a 46 year old right-handed white female with hemochromatosis, heterozygous prothrombin gene mutation s/p bilateral PEs, Mobitz type 1, migraines and thyroid disease who follows up for episodic paresthesias, myalgias and arthralgias. Recent history supplemented by her accompanying husband and rheumatologist's note.   UPDATE: Last seen on 05/23/2019. Labs performed at that time revealed CK 41, aldolase 3.5, negative Lyme IgG/IgM antibody, negative ANCA screen, B6 9, B12 787 and low B1 of 7.  She was advised to start thiamine 173m daily but symptoms ever improved.  NCV-EMG was ordered which she never had performed and was subsequently lost to follow up.  Since last visit, symptoms are getting worse..  She reports increased fatigue.  Numbness and tinging is worse.  Ongoing itching.  She may get hives from time to time but no lingering rash.  She reports tingling and stinging from the waist up, including the face..  Touching skin shoots electric pain.It still occurs off and on.  No consistency to duration or frequency.  But they are lasting longer.  This current flare up has been ongoing for 6 months.  Change in diet, such as gluten - free has not helped.  She reports joint pain, muscle pain.  Does not have night sweats (actually she is cold at night), but wakes up in morning drenched. This past month, she has followed up with rheumatology.  She had a battery of tests checked including ANA,  ESR, CRP, APS, ANCA, B12, D, TSH, CK, CBC, CMP, UA, UPCR which reportedly showed mildly positive ANA but overall unremarkable.    She has hemochromatosis and prothrombin gene mutation and is followed by heme/onc for hemochromatosis.  She has had bilateral PEs.  Labs, including CBC, CMP, iron and ferritin, have overall  been unremarkable.       HISTORY: She has had intermittent episodes of numbness, paresthesias, neuralgia, muscle pain, polyarthralgia and weakness since 2008.  It typically starts with numbness and tingling in the right thigh and then spreads to all extremities, in patchy pattern.  It may occur in different extremities at a time.  Work-up at that time, including MRI and EEG, were negative.  It lasted a few weeks.  She had another episode a couple of years later, lasting 2 weeks.  She had a recurrence in 2016, lasting 2 to 3 months. In June 2020, she started having a recurrence, beginning with paresthesias in the right thigh and subsequently spread to her hands, face and feet with associated neuralgia.  She reports burning sensation in her face, like a sunburn.  Initially, her cheeks were flushed and skin peeling, which was new.  The pain feels like hot pokers in her legs.  She reports diffuse muscle soreness and joint pain.  She has trouble running or even picking up her 44year old.  Sometimes the numbness in her foot is so severe that she has trouble feeling the gas pedal when she drives.  She initially reported chest pain and palpitations.   Labs from 01/07/2019 demonstrated B12 of 248.  She started B12 injections.  Other  labs were unremarkable, including CBC,  CMP, folate 12.9, TSH 0.87, vitamin D 71.06, sed rate 1, CRP <1.0, RF negative, cyclic citrullin peptide antibody negative and magnesium 2.0.  However. jer iron level was elevated at 233 and she was diagnosed with hemochromatosis..  Covid testing was also negative.  She reported family history of multiple sclerosis, so MRI of brain and  cervical spine with and without contrast was performed on 04/16/2019, which were unremarkable. She was subsequently diagnosed with hemochromatosis and has since started phlebotomy.  Despite starting therapy for low B12 and hemochromatosis, no resolution of symptoms.  This is the longest period of time that she continued to experience symptoms.  She has history of migraines since childhood, and they tend to be more frequent during these episodes.  Migraines are typically severe, associated with nausea, photophobia and phonophobia, and often triggered by menstrual cycle or change in weather.  She treats with Fioricet.  On occasion, she has had ocular migraines without headache as well.   She reports that her 58 year old daughter has similar symptoms, which she calls "growing pains".  Other notable family history:  Mother -fibromyalgia; maternal aunt - multiple sclerosis; first cousin - multiple sclerosis and lupus.  PAST MEDICAL HISTORY: Past Medical History:  Diagnosis Date   AMA (advanced maternal age) multigravida 35+    Arrhythmia    Mobitz type 1   Chicken pox    Depression    Depression with anxiety    Frequent headaches    Heart murmur    as a child   Hemochromatosis, hereditary ([place]) 04/03/2019   Pulmonary embolism (HCC)    Tachycardia    as a child   Thyroid disease    Urinary incontinence     MEDICATIONS: Current Outpatient Medications on File Prior to Visit  Medication Sig Dispense Refill   amphetamine-dextroamphetamine (ADDERALL) 20 MG tablet Take 20 mg by mouth 2 (two) times daily.     DULoxetine HCl (CYMBALTA PO) TAKE ONE CAPSULE FOR ONE WEEK THEN INCREASE TO 2 CAPSULES.     gabapentin (NEURONTIN) 100 MG capsule TAKE ONE CAPSULE FOR ONE WEEK AND THEN INCREASE TO 2 CAPSULES AT BEDTIME.     norethindrone (MICRONOR) 0.35 MG tablet Take 1 tablet by mouth daily.     No current facility-administered medications on file prior to visit.    ALLERGIES: Allergies  Allergen  Reactions   Sulfa Antibiotics Anaphylaxis and Hives   Morphine Other (See Comments)    aggressiveness   Morphine And Related Itching and Other (See Comments)    hallucinations   Erythromycin Other (See Comments)    Unknown reaction from childhood    FAMILY HISTORY: Family History  Problem Relation Age of Onset   Diabetes Mother    Arthritis Mother    Depression Mother    Stroke Mother    Miscarriages / [place] Mother    Mental illness Mother    Hyperlipidemia Father    Hypertension Father    Alcohol abuse Father    Arthritis Father    Depression Father    Drug abuse Father    Hearing loss Father    Mental illness Sister    Cancer Paternal Aunt    Breast cancer Paternal Aunt    Depression Maternal Grandmother    Diabetes Maternal Grandmother    Heart disease Maternal Grandfather    Kidney disease Maternal Grandfather    Stroke Maternal Grandfather    Cancer Paternal Grandmother    Ovarian cancer Paternal  Grandmother    Breast cancer Paternal Grandmother    Alcohol abuse Paternal Grandfather    Asthma Paternal Grandfather    Depression Paternal Grandfather    Drug abuse Paternal Grandfather    Hearing loss Paternal Grandfather    Heart disease Paternal Grandfather    Hyperlipidemia Paternal Grandfather    Mental illness Paternal Grandfather    Asthma Brother    Depression Brother    Asthma Son       Objective:  Blood pressure 122/72, pulse 78, height 5' 3" (1.6 m), SpO2 96 %. General: No acute distress.  Patient appears well-groomed.   Head:  Normocephalic/atraumatic Eyes:  Fundi examined but not visualized Neck: supple, no paraspinal tenderness, full range of motion Heart:  Regular rate and rhythm Neurological Exam: alert and oriented to person, place, and time.  Speech fluent and not dysarthric, language intact.  CN II-XII intact. Bulk and tone normal, muscle strength 5/5 throughout.  Sensation to pinprick slightly reduced in hands.  Vibratory sensation  intact.  Deep tendon reflexes 2+ throughout except 3+ patellars, toes downgoing.  Finger to nose testing intact.  Gait normal, Romberg negative.   Metta Clines, DO  CC:  Christinia Gully, MD

## 2022-01-17 ENCOUNTER — Ambulatory Visit: Payer: BLUE CROSS/BLUE SHIELD | Admitting: Neurology

## 2022-01-17 ENCOUNTER — Encounter: Payer: Self-pay | Admitting: Neurology

## 2022-01-17 VITALS — BP 122/72 | HR 78 | Ht 63.0 in

## 2022-01-17 DIAGNOSIS — R202 Paresthesia of skin: Secondary | ICD-10-CM | POA: Diagnosis not present

## 2022-01-17 DIAGNOSIS — M791 Myalgia, unspecified site: Secondary | ICD-10-CM

## 2022-01-17 NOTE — Patient Instructions (Signed)
Will check nerve conduction study of right arm and leg.  Further recommendations pending results.

## 2022-01-25 ENCOUNTER — Encounter: Payer: Self-pay | Admitting: Internal Medicine

## 2022-01-25 ENCOUNTER — Ambulatory Visit
Admission: RE | Admit: 2022-01-25 | Discharge: 2022-01-25 | Disposition: A | Discharge status: Home / Self Care | Payer: BLUE CROSS/BLUE SHIELD | Source: Ambulatory Visit | Attending: Internal Medicine | Admitting: Internal Medicine

## 2022-01-25 ENCOUNTER — Ambulatory Visit: Payer: BLUE CROSS/BLUE SHIELD | Admitting: Internal Medicine

## 2022-01-25 ENCOUNTER — Other Ambulatory Visit: Payer: Self-pay

## 2022-01-25 DIAGNOSIS — Z227 Latent tuberculosis: Secondary | ICD-10-CM

## 2022-01-25 MED ORDER — FLUCONAZOLE 150 MG PO TABS: 150.0000 mg | ORAL_TABLET | Freq: Every day | ORAL | 4 refills | Status: DC

## 2022-01-25 MED ORDER — PYRIDOXINE HCL 50 MG PO TABS
50.0000 mg | ORAL_TABLET | Freq: Every day | ORAL | 8 refills | Status: DC
Start: 2022-01-25 — End: 2024-02-28

## 2022-01-25 MED ORDER — ISONIAZID 300 MG PO TABS: 300.0000 mg | ORAL_TABLET | Freq: Every day | ORAL | 8 refills | Status: DC

## 2022-01-25 MED ORDER — MUPIROCIN 2 % EX OINT: 1.0000 | TOPICAL_OINTMENT | Freq: Two times a day (BID) | CUTANEOUS | 3 refills | Status: DC

## 2022-01-25 NOTE — Progress Notes (Signed)
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 Carlene Coria    Patient ID: [name] [name], female    DOB: 1975/09/25, 46 y.o.   MRN: [id number]  HPI:   She is her for evaluation of a positive test x 2 by rheumatology.   She has a history of various non-specific episodic symptoms including paresthesias, myalgias and arthralgias.  She has a history of hemochromatosis carrier, heterozygous prothrombin gene mutation s/p bilateral PEs, migraines, thyroid disease.  She saw rheumatology for the symptoms and in the work up, had the above test x2.  She does recall being tested with a ppd that was positive when she was about 20.  Never treated for latent Tb.  No current night sweats, no weight loss.  Never in jail.  Has not lived in an [place].  No known exposures.     Past Medical History:  Diagnosis Date   AMA (advanced maternal age) multigravida 35+    Arrhythmia    Mobitz type 1   Chicken pox    Depression    Depression with anxiety    Frequent headaches    Heart murmur    as a child   Hemochromatosis, hereditary ([place]) 04/03/2019   Pulmonary embolism ([place])    Tachycardia    as a child   Urinary incontinence     Prior to Admission medications   Medication Sig Start Date End Date Taking? Authorizing Provider  amphetamine-dextroamphetamine (ADDERALL) 20 MG tablet Take 20 mg by mouth 2 (two) times daily.    [provider]  DULoxetine HCl (CYMBALTA PO) TAKE ONE CAPSULE FOR ONE WEEK THEN INCREASE TO 2 CAPSULES. Patient not taking: Reported on 01/17/2022    [provider]  gabapentin (NEURONTIN) 100 MG capsule TAKE ONE CAPSULE FOR ONE WEEK AND THEN INCREASE TO 2 CAPSULES AT BEDTIME.    [provider]  norethindrone (MICRONOR) 0.35 MG tablet Take 1 tablet by mouth daily. 09/30/21   [provider]    Allergies  Allergen Reactions   Sulfa Antibiotics Anaphylaxis and Hives    Morphine Other (See Comments)    aggressiveness   Morphine And Related Itching and Other (See Comments)    hallucinations   Erythromycin Other (See Comments)    Unknown reaction from childhood    Social History   Tobacco Use   Smoking status: Former   Smokeless tobacco: Never  Vaping Use   Vaping Use: Former   Start date: 03/15/2014   Quit date: 04/14/2016  Substance Use Topics   Alcohol use: Yes    Comment: daily glass of wine   Drug use: No    Family History  Problem Relation Age of Onset   Diabetes Mother    Arthritis Mother    Depression Mother    Stroke Mother    Miscarriages / [place] Mother    Mental illness Mother    Migraines Father    Hyperlipidemia Father    Hypertension Father    Alcohol abuse Father    Arthritis Father    Depression Father    Drug abuse Father    Hearing loss Father    Mental illness Sister    Asthma Brother    Depression Brother    Multiple sclerosis Maternal Aunt    Seizures Paternal Aunt    Cancer Paternal Aunt    Breast cancer Paternal Aunt    Depression Maternal Grandmother  Diabetes Maternal Grandmother    Heart disease Maternal Grandfather    Kidney disease Maternal Grandfather    Stroke Maternal Grandfather    Cancer Paternal Grandmother    Ovarian cancer Paternal Grandmother    Breast cancer Paternal Grandmother    Alcohol abuse Paternal Grandfather    Asthma Paternal Grandfather    Depression Paternal Grandfather    Drug abuse Paternal Grandfather    Hearing loss Paternal Grandfather    Heart disease Paternal Grandfather    Hyperlipidemia Paternal Grandfather    Mental illness Paternal Grandfather    Migraines Son    Asthma Son    Migraines Daughter    Multiple sclerosis Cousin     Review of Systems  Constitutional: negative for fevers and chills Respiratory: negative for cough, sputum, or hemoptysis All other systems reviewed and are negative    Constitutional: in no apparent distress  Vitals:    01/25/22 0959  BP: 131/89  Pulse: 78  Temp: 98.4 F (36.9 C)  SpO2: 100%   EYES: anicteric Respiratory: normal respiratory effort Skin: no rash  Labs: Lab Results  Component Value Date   WBC 6.3 11/30/2021   HGB 12.7 11/30/2021   HCT 38.8 11/30/2021   MCV 104.3 (H) 11/30/2021   PLT 305 11/30/2021    Lab Results  Component Value Date   CREATININE 0.87 11/30/2021   BUN 10 11/30/2021   NA 142 11/30/2021   K 3.7 11/30/2021   CL 104 11/30/2021   CO2 27 11/30/2021    Lab Results  Component Value Date   ALT 15 11/30/2021   AST 23 11/30/2021   ALKPHOS 46 11/30/2021   BILITOT 0.4 11/30/2021     Assessment: 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  Recent LFTs wnl.   Plan: 1)  CXR 2) if negative xray, isoniazid 300 mg daily for 9 months 3) b6 50 mg daily with INH 4) follow up in 3-4 weeks for hepatic function panel and HIV 5) diflucan as needed for yeast infection 6) bactroban for MRSA nasal colonization

## 2022-01-26 ENCOUNTER — Telehealth: Payer: Self-pay

## 2022-01-26 NOTE — Telephone Encounter (Signed)
-----   Message from Thayer Headings, MD sent at 01/26/2022  4:34 PM EDT ----- Please let her know to go ahead and start her INH and B6.  I reviewed her CXR and it is fine.  Still waiting on the official report for some reason but ok to start.   thanks

## 2022-01-26 NOTE — Telephone Encounter (Signed)
Patient aware to start Pala and B6 and of CXR results. She is aware Dr.Comer is waiting for official report but ok to start.    Sargeant, CMA

## 2022-02-13 ENCOUNTER — Encounter: Payer: Self-pay | Admitting: Internal Medicine

## 2022-02-14 ENCOUNTER — Encounter: Payer: Self-pay | Admitting: Internal Medicine

## 2022-02-19 ENCOUNTER — Other Ambulatory Visit: Payer: Self-pay | Admitting: Internal Medicine

## 2022-02-21 ENCOUNTER — Encounter: Payer: Self-pay | Admitting: Neurology

## 2022-02-21 ENCOUNTER — Ambulatory Visit: Payer: BLUE CROSS/BLUE SHIELD | Admitting: Neurology

## 2022-02-21 DIAGNOSIS — M791 Myalgia, unspecified site: Secondary | ICD-10-CM

## 2022-02-21 DIAGNOSIS — R202 Paresthesia of skin: Secondary | ICD-10-CM

## 2022-02-21 DIAGNOSIS — G5601 Carpal tunnel syndrome, right upper limb: Secondary | ICD-10-CM

## 2022-02-21 NOTE — Procedures (Signed)
[facility]  [place]  [place], [place] [address] Tel: [phone] Fax: [phone] Test Date:  02/21/2022  Patient: [name] [name] DOB: [birth year] Physician: Narda Amber, DO  Sex: Female Height: 5\' 3"  Ref Phys: Metta Clines, DO  ID#: [id number]   Technician:    History: This is a 46 year old female referred for evaluation of generalized paresthesias, myalgias, and weakness.  NCV & EMG Findings: Extensive electrodiagnostic testing of the right upper and lower extremity shows:  Right mixed palmar sensory responses show prolonged latency.  Right median, ulnar, sural, and superficial peroneal sensory responses are within normal limits. Right median, ulnar, peroneal, and tibial motor responses are within normal limits. Right tibial H reflex study is within normal limits. There is no evidence of active or chronic motor axonal loss changes affecting any of the tested muscles.  Motor unit configuration and recruitment pattern is within normal limits.  Impression: Right median neuropathy at or distal to the wrist, consistent with a clinical diagnosis of carpal tunnel syndrome.  Overall, these findings are very mild in degree electrically. There is no evidence of a diffuse sensorimotor polyneuropathy, myopathy, or cervical/lumbosacral radiculopathy.   ___________________________ Narda Amber, DO    Nerve Conduction Studies Motor Nerve Results    Latency Amplitude F-Lat Segment Distance CV Comment  Site (ms) Norm (mV) Norm (ms)  (cm) (m/s) Norm   Right Fibular (EDB) Motor  Ankle 2.4  < 5.5 6.0  > 3.0        Bel fib head 9.6 - 5.8 -  Bel fib head-Ankle 36 50  > 40   Pop fossa 11.2 - 5.7 -  Pop fossa-Bel fib head 8 50 -   Right Median (APB) Motor  Wrist 3.4  < 3.9 12.4  > 6.0        Elbow 8.2 - 11.1 -  Elbow-Wrist 27 56  > 50   Right Tibial (AH) Motor  Ankle 3.0  < 6.0 14.4  > 8.0        Knee 10.6 - 11.0 -  Knee-Ankle 39 51  > 40   Right Ulnar (ADM) Motor   Wrist 1.83  < 3.1 8.5  > 7.0        Bel elbow 4.9 - 8.1 -  Bel elbow-Wrist 19 61  > 50   Ab elbow 6.4 - 8.0 -  Ab elbow-Bel elbow 10 67 -    Sensory Sites    Neg Peak Lat Amplitude (O-P) Segment Distance Velocity Comment  Site (ms) Norm (V) Norm  (cm) (ms)   Right Median Sensory  Wrist-Dig II 3.3  < 3.4 25  > 20 Wrist-Dig II 13    Right Median-Ulnar Palmar Sensory       Median  Palm-Wrist *2.3  < 2.2 68  > 10 Palm-Wrist 8         Ulnar  Palm-Wrist 1.60  < 2.2 11  > 5 Palm-Wrist 8    Right Superficial Fibular Sensory  14 cm-Ankle 2.7  < 4.5 20  > 5 14 cm-Ankle 14    Right Sural Sensory  Calf-Lat mall 2.5  < 4.5 21  > 5 Calf-Lat mall 14    Right Ulnar Sensory  Wrist-Dig V 2.5  < 3.1 34  > 12 Wrist-Dig V 11     H-Reflex Results    M-Lat H Lat H Neg Amp H-M Lat  Site (ms) (ms) Norm (mV) (ms)  Right Tibial H-Reflex  Pop fossa 4.1 29.1  <  35.0 78.3 25.0   Inter-Nerve Comparisons   Nerve 1 Value 1 Nerve 2 Value 2 Parameter Result Normal  Sensory Sites  R Median Palm-Wrist 2.3 ms R Ulnar Palm-Wrist 1.60 ms Peak Lat Diff *0.70 ms <0.30   Electromyography   Side Muscle Ins.Act Fibs Fasc Recrt Amp Dur Poly Activation Comment  Right FDL Nml Nml Nml Nml Nml Nml Nml Nml N/A  Right Gastroc MH Nml Nml Nml Nml Nml Nml Nml Nml N/A  Right Gluteus med Nml Nml Nml Nml Nml Nml Nml Nml N/A  Right Rectus fem Nml Nml Nml Nml Nml Nml Nml Nml N/A  Right Tib ant Nml Nml Nml Nml Nml Nml Nml Nml N/A  Right FDI Nml Nml Nml Nml Nml Nml Nml Nml N/A  Right APB Nml Nml Nml Nml Nml Nml Nml Nml N/A  Right Pronator teres Nml Nml Nml Nml Nml Nml Nml Nml N/A  Right Biceps Nml Nml Nml Nml Nml Nml Nml Nml N/A  Right Triceps Nml Nml Nml Nml Nml Nml Nml Nml N/A  Right Deltoid Nml Nml Nml Nml Nml Nml Nml Nml N/A      Waveforms:  Motor           Sensory             H-Reflex

## 2022-02-21 NOTE — Progress Notes (Signed)
Patient advised of her EMG results.

## 2022-02-22 ENCOUNTER — Encounter: Payer: Self-pay | Admitting: Internal Medicine

## 2022-02-22 ENCOUNTER — Other Ambulatory Visit: Payer: Self-pay

## 2022-02-22 ENCOUNTER — Ambulatory Visit: Payer: BLUE CROSS/BLUE SHIELD | Admitting: Internal Medicine

## 2022-02-22 VITALS — BP 144/84 | HR 100 | Resp 17 | Ht 63.0 in | Wt 123.0 lb

## 2022-02-22 DIAGNOSIS — O09529 Supervision of elderly multigravida, unspecified trimester: Secondary | ICD-10-CM | POA: Insufficient documentation

## 2022-02-22 DIAGNOSIS — Z5181 Encounter for therapeutic drug level monitoring: Secondary | ICD-10-CM

## 2022-02-22 DIAGNOSIS — Z227 Latent tuberculosis: Secondary | ICD-10-CM | POA: Diagnosis not present

## 2022-02-23 ENCOUNTER — Telehealth: Payer: Self-pay

## 2022-02-23 ENCOUNTER — Encounter: Payer: Self-pay | Admitting: Internal Medicine

## 2022-02-23 DIAGNOSIS — Z5181 Encounter for therapeutic drug level monitoring: Secondary | ICD-10-CM | POA: Insufficient documentation

## 2022-02-23 LAB — HEPATIC FUNCTION PANEL
AG Ratio: 1.6 (calc) (ref 1.0–2.5)
ALT: 30 U/L — ABNORMAL HIGH (ref 6–29)
AST: 34 U/L — ABNORMAL HIGH (ref 10–30)
Albumin: 4.8 g/dL (ref 3.6–5.1)
Alkaline phosphatase (APISO): 58 U/L (ref 31–125)
Bilirubin, Direct: 0.1 mg/dL (ref 0.0–0.2)
Globulin: 3 g/dL (calc) (ref 1.9–3.7)
Indirect Bilirubin: 0.5 mg/dL (calc) (ref 0.2–1.2)
Total Bilirubin: 0.6 mg/dL (ref 0.2–1.2)
Total Protein: 7.8 g/dL (ref 6.1–8.1)

## 2022-02-23 NOTE — Progress Notes (Signed)
   Subjective:    Patient ID: [name] [name], female    DOB: 1975-11-13, 46 y.o.   MRN: [id number]  HPI [name] [name] is here for follow up of latent Tb She was started on INH with B6 and doing well.  No rash, diarrhea or concerns.     Review of Systems  Constitutional:  Negative for chills, fatigue and fever.  Gastrointestinal:  Negative for diarrhea.  Skin:  Negative for rash.       Objective:   Physical Exam Eyes:     General: No scleral icterus. Pulmonary:     Effort: Pulmonary effort is normal.  Neurological:     Mental Status: She is alert.           Assessment & Plan:

## 2022-02-23 NOTE — Telephone Encounter (Signed)
Per Mychart message EMG results sent to Referring provider via epic as well as fax. [phone].

## 2022-02-23 NOTE — Assessment & Plan Note (Signed)
She is on INH and good tolerance.  Will continue with the plan for 9 months and she will return in about 6 months for final follow up

## 2022-02-23 NOTE — Assessment & Plan Note (Signed)
LFTs checked at the visit and very mild elevation.  No changes to her current regimen.

## 2022-03-01 ENCOUNTER — Inpatient Hospital Stay: Payer: BLUE CROSS/BLUE SHIELD | Attending: Hematology & Oncology

## 2022-03-01 ENCOUNTER — Inpatient Hospital Stay: Payer: BLUE CROSS/BLUE SHIELD | Admitting: Family

## 2022-03-09 NOTE — Progress Notes (Incomplete)
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     DULoxetine (CYMBALTA) 30 MG capsule Take 30 mg by mouth daily.     fluconazole (DIFLUCAN) 150 MG tablet Take 1 tablet (150 mg total) by mouth daily. 1 tablet 4   gabapentin (NEURONTIN) 100 MG capsule TAKE ONE CAPSULE FOR ONE WEEK AND THEN INCREASE TO 2 CAPSULES AT BEDTIME. (Patient not taking: Reported on 01/25/2022)     isoniazid (NYDRAZID) 300 MG tablet Take 1 tablet (300 mg total) by mouth daily. 30 tablet 8   mupirocin ointment (BACTROBAN) 2 % Place 1 Application into the nose 2 (two) times daily. For 5 days 30 g 3   norethindrone (MICRONOR) 0.35 MG tablet Take 1 tablet by mouth daily.     pyridOXINE (B-6) 50 MG tablet Take 1 tablet (50 mg total) by mouth daily. 30 tablet 8   No current facility-administered medications on file prior to visit.    ALLERGIES: Allergies  Allergen Reactions   Sulfa Antibiotics Anaphylaxis and Hives   Morphine Other (See Comments)    aggressiveness   Morphine And Related Itching and Other (See Comments)    hallucinations   Erythromycin Other (See Comments)    Unknown reaction from childhood    FAMILY HISTORY: Family History  Problem Relation Age of Onset   Diabetes Mother    Arthritis Mother    Depression Mother    Stroke Mother    Miscarriages /  [place] Mother    Mental illness Mother    Migraines Father    Hyperlipidemia Father    Hypertension Father    Alcohol abuse Father    Arthritis Father    Depression Father    Drug abuse Father    Hearing loss Father    Mental illness Sister    Asthma Brother    Depression Brother    Multiple sclerosis Maternal Aunt    Seizures Paternal Aunt    Cancer Paternal Aunt    Breast cancer Paternal Aunt    Depression Maternal Grandmother    Diabetes Maternal Grandmother    Heart disease Maternal Grandfather    Kidney disease Maternal Grandfather    Stroke Maternal Grandfather    Cancer Paternal Grandmother    Ovarian cancer Paternal Grandmother    Breast cancer Paternal Grandmother    Alcohol abuse Paternal Grandfather    Asthma Paternal Grandfather    Depression Paternal Grandfather    Drug abuse Paternal Grandfather    Hearing loss Paternal Grandfather    Heart disease Paternal Grandfather    Hyperlipidemia Paternal Grandfather    Mental illness Paternal Grandfather    Migraines Son    Asthma Son    Migraines Daughter    Multiple sclerosis Cousin  Objective:  *** General: No acute distress.  Patient appears ***-groomed.   Head:  Normocephalic/atraumatic Eyes:  Fundi examined but not visualized Neck: supple, no paraspinal tenderness, full range of motion Heart:  Regular rate and rhythm Lungs:  Clear to auscultation bilaterally Back: No paraspinal tenderness Neurological Exam: alert and oriented to person, place, and time.  Speech fluent and not dysarthric, language intact.  CN II-XII intact. Bulk and tone normal, muscle strength 5/5 throughout.  Sensation to light touch intact.  Deep tendon reflexes 2+ throughout, toes downgoing.  Finger to nose testing intact.  Gait normal, Romberg negative.   Metta Clines, DO  CC: ***

## 2022-03-10 NOTE — Progress Notes (Deleted)
NEUROLOGY FOLLOW UP OFFICE NOTE  [name] [name] [id number]  Assessment/Plan:   Recurrent prolonged episodes of various symptomatology, including paresthesias, numbness, neuralgia, myalgia, polyarthralgia, weakness.   Symptoms seem more consistent with some underlying autoimmune disorder although workup so far unrevealing.  This does not represent multiple sclerosis or other CNS etiology   Will check NCV-EMG.  Further recommendations pending results. 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 Recent history supplemented by her accompanying husband and rheumatologist's note.    UPDATE: NCV-EMG on 02/21/2022 showed evidence of very mild right median neuropathy at/distal to the wrist, but no evidence of diffuse sensorimotor polyneuropathy, myopathy or cervical/lumbosacral radiculopathy.  Previous testing by rheumatology revealed positive Quantiferon-TB Gold Plus consistent with latent-TB.  She followed up with ID and was placed on INH.                HISTORY: She has had intermittent episodes of numbness, paresthesias, neuralgia, muscle pain, polyarthralgia and weakness since 2008.  It typically starts with numbness and tingling in the right thigh and then spreads to all extremities, in patchy pattern.  It may occur in different extremities at a time.  Work-up at that time, including MRI and EEG, were negative.  It lasted a few weeks.  She had another episode a couple of years later, lasting 2 weeks.  She had a recurrence in 2016, lasting 2 to 3 months. In June 2020, she started having a recurrence, beginning with paresthesias in the right thigh and subsequently spread to her hands, face and feet with associated neuralgia.  She reports  burning sensation in her face, like a sunburn.  Initially, her cheeks were flushed and skin peeling, which was new.  The pain feels like hot pokers in her legs.  She reports diffuse muscle soreness and joint pain.  She has trouble running or even picking up her 44 year old.  Sometimes the numbness in her foot is so severe that she has trouble feeling the gas pedal when she drives.  She initially reported chest pain and palpitations.   Labs from 01/07/2019 demonstrated B12 of 248.  She started B12 injections.  Other labs were unremarkable, including CBC,  CMP, folate 12.9, TSH 0.87, vitamin D 71.06, sed rate 1, CRP <1.0, RF negative, cyclic citrullin peptide antibody negative and magnesium 2.0.  However. jer iron level was elevated at 233 and she was diagnosed with hemochromatosis..  Covid testing was also negative.  She reported family history of multiple sclerosis, so MRI of brain and cervical spine with and without contrast was performed on 04/16/2019, which were unremarkable. She was subsequently diagnosed with hemochromatosis and has since started phlebotomy.  Despite starting therapy for low B12 and hemochromatosis, no resolution of symptoms.  This is the longest period of time that she continued to experience symptoms.  She has history of migraines since childhood, and they tend to be more frequent during these episodes.  Migraines are typically severe, associated with nausea, photophobia and phonophobia, and often triggered by menstrual cycle or change in weather.  She treats with Fioricet.  On occasion, she has had ocular migraines without headache as well.  Labs performed at that time revealed CK 41, aldolase 3.5, negative Lyme IgG/IgM antibody, negative ANCA screen, B6 9, B12 787 and  low B1 of 7.  She was advised to start thiamine 187m daily but symptoms never improved.  NCV-EMG was ordered which she never had performed and was subsequently lost to follow up.  Since last visit, symptoms are getting worse..   She reports increased fatigue.  Numbness and tinging is worse.  Ongoing itching.  She may get hives from time to time but no lingering rash.  She reports tingling and stinging from the waist up, including the face..  Touching skin shoots electric pain.It still occurs off and on.  No consistency to duration or frequency.  But they are lasting longer.  This current flare up has been ongoing for 6 months.  Change in diet, such as gluten - free has not helped.  She reports joint pain, muscle pain.  Does not have night sweats (actually she is cold at night), but wakes up in morning drenched. This past month, she has followed up with rheumatology.  She had a battery of tests checked including ANA, ESR, CRP, APS, ANCA, B12, D, TSH, CK, CBC, CMP, UA, UPCR which reportedly showed mildly positive ANA but overall unremarkable.    She reports that her 150year old daughter has similar symptoms, which she calls "growing pains".  Other notable family history:  Mother -fibromyalgia; maternal aunt - multiple sclerosis; first cousin - multiple sclerosis and lupus.  She has hemochromatosis and prothrombin gene mutation and is followed by heme/onc for hemochromatosis.  She has had bilateral PEs.  Labs, including CBC, CMP, iron and ferritin, have overall  been unremarkable.    PAST MEDICAL HISTORY: Past Medical History:  Diagnosis Date   AMA (advanced maternal age) multigravida 35+    Arrhythmia    Mobitz type 1   Chicken pox    Depression    Depression with anxiety    Frequent headaches    Heart murmur    as a child   Hem[facility], [facility] ([facility] 04/03/2019   Pulmonary embolism (HCC)    Tachycardia    as a child   Urinary incontinence     MEDICATIONS: Current Outpatient Medications on File Prior to Visit  Medication Sig Dispense Refill   amphetamine-dextroamphetamine (ADDERALL) 20 MG tablet Take 20 mg by mouth 2 (two) times daily.     DULoxetine (CYMBALTA) 30 MG capsule Take 30 mg by mouth daily.      fluconazole (DIFLUCAN) 150 MG tablet Take 1 tablet (150 mg total) by mouth daily. 1 tablet 4   gabapentin (NEURONTIN) 100 MG capsule TAKE ONE CAPSULE FOR ONE WEEK AND THEN INCREASE TO 2 CAPSULES AT BEDTIME. (Patient not taking: Reported on 01/25/2022)     isoniazid (NYDRAZID) 300 MG tablet Take 1 tablet (300 mg total) by mouth daily. 30 tablet 8   mupirocin ointment (BACTROBAN) 2 % Place 1 Application into the nose 2 (two) times daily. For 5 days 30 g 3   norethindrone (MICRONOR) 0.35 MG tablet Take 1 tablet by mouth daily.     pyridOXINE (B-6) 50 MG tablet Take 1 tablet (50 mg total) by mouth daily. 30 tablet 8   No current facility-administered medications on file prior to visit.    ALLERGIES: Allergies  Allergen Reactions   Sulfa Antibiotics Anaphylaxis and Hives   Morphine Other (See Comments)    aggressiveness   Morphine And Related Itching and Other (See Comments)    hallucinations   Erythromycin Other (See Comments)    Unknown reaction from childhood    FAMILY HISTORY: Family History  Problem  Relation Age of Onset   Diabetes Mother    Arthritis Mother    Depression Mother    Stroke Mother    Miscarriages / [place] Mother    Mental illness Mother    Migraines Father    Hyperlipidemia Father    Hypertension Father    Alcohol abuse Father    Arthritis Father    Depression Father    Drug abuse Father    Hearing loss Father    Mental illness Sister    Asthma Brother    Depression Brother    Multiple sclerosis Maternal Aunt    Seizures Paternal Aunt    Cancer Paternal Aunt    Breast cancer Paternal Aunt    Depression Maternal Grandmother    Diabetes Maternal Grandmother    Heart disease Maternal Grandfather    Kidney disease Maternal Grandfather    Stroke Maternal Grandfather    Cancer Paternal Grandmother    Ovarian cancer Paternal Grandmother    Breast cancer Paternal Grandmother    Alcohol abuse Paternal Grandfather    Asthma Paternal Grandfather     Depression Paternal Grandfather    Drug abuse Paternal Grandfather    Hearing loss Paternal Grandfather    Heart disease Paternal Grandfather    Hyperlipidemia Paternal Grandfather    Mental illness Paternal Grandfather    Migraines Son    Asthma Son    Migraines Daughter    Multiple sclerosis Cousin       Objective:  *** General: No acute distress.  Patient appears ***-groomed.   Head:  Normocephalic/atraumatic Eyes:  Fundi examined but not visualized Neck: supple, no paraspinal tenderness, full range of motion Heart:  Regular rate and rhythm Lungs:  Clear to auscultation bilaterally Back: No paraspinal tenderness Neurological Exam: alert and oriented to person, place, and time.  Speech fluent and not dysarthric, language intact.  CN II-XII intact. Bulk and tone normal, muscle strength 5/5 throughout.  Sensation to light touch intact.  Deep tendon reflexes 2+ throughout, toes downgoing.  Finger to nose testing intact.  Gait normal, Romberg negative.   Metta Clines, DO  CC: ***

## 2022-03-13 ENCOUNTER — Ambulatory Visit: Payer: BLUE CROSS/BLUE SHIELD | Admitting: Neurology

## 2022-03-15 NOTE — Progress Notes (Signed)
NEUROLOGY FOLLOW UP OFFICE NOTE  CHANIYAH JAHR [id number]  Assessment/Plan:   Recurrent prolonged episodes of various symptomatology, including paresthesias, numbness, neuralgia, myalgia, polyarthralgia, weakness.   Symptoms seem more consistent with some underlying autoimmune disorder 2. Migraine without aura, without status migrainosus, not intractable 3  Ocular migraine   Migraine prevention:  Start Emgality Migraine rescue:  sumatriptan 25-65m as needed Limit use of pain relievers to no more than 2 days out of week to prevent risk of rebound or medication-overuse headache. Keep headache diary Follow up in 4-5 months.   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 Recent history supplemented by her accompanying husband and rheumatologist's note.    UPDATE: NCV-EMG on 02/21/2022 showed evidence of very mild right median neuropathy at/distal to the wrist, but no evidence of diffuse sensorimotor polyneuropathy, myopathy or cervical/lumbosacral radiculopathy.  Previous testing by rheumatology revealed positive Quantiferon-TB Gold Plus consistent with latent-TB.  She followed up with ID and was placed on INH.    Symptoms unchanged.  Rheumatology has started treating her symptoms for possible SLE.  She has been started on Plaquenil.   HISTORY: She has had intermittent episodes of numbness, paresthesias, neuralgia, muscle pain, polyarthralgia and weakness since 2008.  It typically starts with numbness and tingling in the right thigh and then spreads to all extremities, in patchy pattern.  It may occur in different extremities at a time.  Work-up at that time, including MRI and EEG, were negative.  It lasted a few weeks.  She had another episode a couple of years later,  lasting 2 weeks.  She had a recurrence in 2016, lasting 2 to 3 months. In June 2020, she started having a recurrence, beginning with paresthesias in the right thigh and subsequently spread to her hands, face and feet with associated neuralgia.  She reports burning sensation in her face, like a sunburn.  Initially, her cheeks were flushed and skin peeling, which was new.  The pain feels like hot pokers in her legs.  She reports diffuse muscle soreness and joint pain.  She has trouble running or even picking up her 44year old.  Sometimes the numbness in her foot is so severe that she has trouble feeling the gas pedal when she drives.  She initially reported chest pain and palpitations.   Labs from 01/07/2019 demonstrated B12 of 248.  She started B12 injections.  Other labs were unremarkable, including CBC,  CMP, folate 12.9, TSH 0.87, vitamin D 71.06, sed rate 1, CRP <1.0, RF negative, cyclic citrullin peptide antibody negative and magnesium 2.0.  However. jer iron level was elevated at 233 and she was diagnosed with hemochromatosis..  Covid testing was also negative.  She reported family history of multiple sclerosis, so MRI of brain and cervical spine with and without contrast was performed on 04/16/2019, which were unremarkable. She was subsequently diagnosed with hemochromatosis and has since started phlebotomy.  Despite starting therapy for low B12 and hemochromatosis, no resolution of symptoms.  This is the longest period of time that she continued to experience symptoms.  She has history of migraines since childhood, and they tend to be more frequent during these episodes.  Migraines are typically severe, behind eyes, right sided/bilateral, associated with photophobia and phonophobia, and often triggered by menstrual cycle or change in weather.  She  treats with Fioricet.  On occasion, she has had ocular migraines without headache as well (halo-effect over objects and then objects disappear).  She once had a  thunderclap headache.  Labs performed at that time revealed CK 41, aldolase 3.5, negative Lyme IgG/IgM antibody, negative ANCA screen, B6 9, B12 787 and low B1 of 7.  She was advised to start thiamine 113m daily but symptoms never improved.  NCV-EMG was ordered which she never had performed and was subsequently lost to follow up.  Since that visit in 2021, symptoms have gotten worse.  She reports increased fatigue.  Numbness and tinging is worse.  Ongoing itching.  She may get hives from time to time but no lingering rash.  She reports tingling and stinging from the waist up, including the face..  Touching skin shoots electric pain.It still occurs off and on.  No consistency to duration or frequency.  But they are lasting longer.  This current flare up has been ongoing for 6 months.  Change in diet, such as gluten - free has not helped.  She reports joint pain, muscle pain.  Does not have night sweats (actually she is cold at night), but wakes up in morning drenched. In September 2023, she has followed up with rheumatology.  She had a battery of tests checked including ANA, ESR, CRP, APS, ANCA, B12, D, TSH, CK, CBC, CMP, UA, UPCR which reportedly showed mildly positive ANA but overall unremarkable.    She reports that her 143year old daughter has similar symptoms, which she calls "growing pains".  Other notable family history:  Mother -fibromyalgia; maternal aunt - multiple sclerosis; first cousin - multiple sclerosis and lupus.  She has hemochromatosis and prothrombin gene mutation and is followed by heme/onc for hemochromatosis.  She has had bilateral PEs.  Labs, including CBC, CMP, iron and ferritin, have overall  been unremarkable.     Past triptans:  Zomig (side effects serotonin syndrome), sumatriptan 245m(effective), oxycodone Past preventatives:  Propranolol, amitriptyline,    2-3 a week -  PAST MEDICAL HISTORY: Past Medical History:  Diagnosis Date   AMA (advanced maternal age) multigravida  35+    Arrhythmia    Mobitz type 1   Chicken pox    Depression    Depression with anxiety    Frequent headaches    Heart murmur    as a child   [facility] ([facility]   Pulmonary embolism (HCC)    Tachycardia    as a child   Urinary incontinence     MEDICATIONS: Current Outpatient Medications on File Prior to Visit  Medication Sig Dispense Refill   amphetamine-dextroamphetamine (ADDERALL) 20 MG tablet Take 20 mg by mouth 2 (two) times daily.     DULoxetine (CYMBALTA) 30 MG capsule Take 30 mg by mouth daily.     fluconazole (DIFLUCAN) 150 MG tablet Take 1 tablet (150 mg total) by mouth daily. 1 tablet 4   gabapentin (NEURONTIN) 100 MG capsule TAKE ONE CAPSULE FOR ONE WEEK AND THEN INCREASE TO 2 CAPSULES AT BEDTIME. (Patient not taking: Reported on 01/25/2022)     isoniazid (NYDRAZID) 300 MG tablet Take 1 tablet (300 mg total) by mouth daily. 30 tablet 8   mupirocin ointment (BACTROBAN) 2 % Place 1 Application into the nose 2 (two) times daily. For 5 days 30 g 3   norethindrone (MICRONOR) 0.35 MG tablet Take 1 tablet by mouth daily.     pyridOXINE (B-6) 50 MG tablet Take 1 tablet (50  mg total) by mouth daily. 30 tablet 8   No current facility-administered medications on file prior to visit.    ALLERGIES: Allergies  Allergen Reactions   Sulfa Antibiotics Anaphylaxis and Hives   Morphine Other (See Comments)    aggressiveness   Morphine And Related Itching and Other (See Comments)    hallucinations   Erythromycin Other (See Comments)    Unknown reaction from childhood    FAMILY HISTORY: Family History  Problem Relation Age of Onset   Diabetes Mother    Arthritis Mother    Depression Mother    Stroke Mother    Miscarriages / [place] Mother    Mental illness Mother    Migraines Father    Hyperlipidemia Father    Hypertension Father    Alcohol abuse Father    Arthritis Father    Depression Father    Drug abuse Father    Hearing loss Father     Mental illness Sister    Asthma Brother    Depression Brother    Multiple sclerosis Maternal Aunt    Seizures Paternal Aunt    Cancer Paternal Aunt    Breast cancer Paternal Aunt    Depression Maternal Grandmother    Diabetes Maternal Grandmother    Heart disease Maternal Grandfather    Kidney disease Maternal Grandfather    Stroke Maternal Grandfather    Cancer Paternal Grandmother    Ovarian cancer Paternal Grandmother    Breast cancer Paternal Grandmother    Alcohol abuse Paternal Grandfather    Asthma Paternal Grandfather    Depression Paternal Grandfather    Drug abuse Paternal Grandfather    Hearing loss Paternal Grandfather    Heart disease Paternal Grandfather    Hyperlipidemia Paternal Grandfather    Mental illness Paternal Grandfather    Migraines Son    Asthma Son    Migraines Daughter    Multiple sclerosis Cousin       Objective:  Blood pressure 133/85, pulse 99, height _0  (1.6 m), weight 120 lb 9.6 oz (54.7 kg), SpO2 100 %. General: No acute distress.  Patient appears well-groomed.     Metta Clines, DO

## 2022-03-21 ENCOUNTER — Other Ambulatory Visit: Payer: Self-pay | Admitting: Internal Medicine

## 2022-03-22 ENCOUNTER — Ambulatory Visit: Payer: BLUE CROSS/BLUE SHIELD | Admitting: Neurology

## 2022-03-22 ENCOUNTER — Encounter: Payer: Self-pay | Admitting: Neurology

## 2022-03-22 VITALS — BP 133/85 | HR 99 | Ht 63.0 in | Wt 120.6 lb

## 2022-03-22 DIAGNOSIS — G43009 Migraine without aura, not intractable, without status migrainosus: Secondary | ICD-10-CM

## 2022-03-22 DIAGNOSIS — G43109 Migraine with aura, not intractable, without status migrainosus: Secondary | ICD-10-CM | POA: Diagnosis not present

## 2022-03-22 MED ORDER — SUMATRIPTAN SUCCINATE 50 MG PO TABS: 50.0000 mg | ORAL_TABLET | ORAL | 5 refills | Status: DC | PRN

## 2022-03-22 MED ORDER — EMGALITY 120 MG/ML ~~LOC~~ SOAJ: 120.0000 mg | SUBCUTANEOUS | 11 refills | Status: DC

## 2022-03-22 MED ORDER — EMGALITY 120 MG/ML ~~LOC~~ SOAJ: 240.0000 mg | Freq: Once | SUBCUTANEOUS | 0 refills | Status: AC

## 2022-03-22 NOTE — Patient Instructions (Addendum)
Plan to start Emgality - 2 injections for first dose.  Then 1 injection every 28 days thereafter. At earliest onset of migraine, take sumatriptan 1/2-1 tablet.  May repeat after 2 hours (maximum 2 doses in 24 hours) Limit use of pain relievers to no more than 2 days out of week to prevent risk of rebound or medication-overuse headache. Keep headache diary Follow up 4-5 months.

## 2022-03-29 ENCOUNTER — Encounter: Payer: Self-pay | Admitting: Family

## 2022-03-29 NOTE — Telephone Encounter (Signed)
Rcvd message from pt regarding a f/u appt. Message to scheduling to r/s missed F/U appt from 11/82/23.  Labs from MyChart message sent to medical records to be scanned into chart.

## 2022-04-03 ENCOUNTER — Other Ambulatory Visit: Payer: Self-pay

## 2022-04-03 ENCOUNTER — Encounter: Payer: Self-pay | Admitting: Family

## 2022-04-03 ENCOUNTER — Inpatient Hospital Stay: Payer: BLUE CROSS/BLUE SHIELD | Attending: Hematology & Oncology | Admitting: Family

## 2022-04-03 ENCOUNTER — Inpatient Hospital Stay: Payer: BLUE CROSS/BLUE SHIELD

## 2022-04-03 DIAGNOSIS — Z86711 Personal history of pulmonary embolism: Secondary | ICD-10-CM | POA: Diagnosis present

## 2022-04-03 DIAGNOSIS — D6852 Prothrombin gene mutation: Secondary | ICD-10-CM

## 2022-04-03 DIAGNOSIS — Z7901 Long term (current) use of anticoagulants: Secondary | ICD-10-CM | POA: Diagnosis not present

## 2022-04-03 DIAGNOSIS — I2699 Other pulmonary embolism without acute cor pulmonale: Secondary | ICD-10-CM

## 2022-04-03 LAB — CMP (CANCER CENTER ONLY)
ALT: 27 U/L (ref 0–44)
AST: 32 U/L (ref 15–41)
Albumin: 4.8 g/dL (ref 3.5–5.0)
Alkaline Phosphatase: 57 U/L (ref 38–126)
Anion gap: 9 (ref 5–15)
BUN: 9 mg/dL (ref 6–20)
CO2: 28 mmol/L (ref 22–32)
Calcium: 9.7 mg/dL (ref 8.9–10.3)
Chloride: 103 mmol/L (ref 98–111)
Creatinine: 0.9 mg/dL (ref 0.44–1.00)
GFR, Estimated: >60 mL/min (ref >60)
Glucose, Bld: 143 mg/dL — ABNORMAL HIGH (ref 70–99)
Potassium: 4 mmol/L (ref 3.5–5.1)
Sodium: 140 mmol/L (ref 135–145)
Total Bilirubin: 0.4 mg/dL (ref 0.3–1.2)
Total Protein: 7.8 g/dL (ref 6.5–8.1)

## 2022-04-03 LAB — CBC WITH DIFFERENTIAL (CANCER CENTER ONLY)
Abs Immature Granulocytes: 0.02 10*3/uL (ref 0.00–0.07)
Basophils Absolute: 0 10*3/uL (ref 0.0–0.1)
Basophils Relative: 1 %
Eosinophils Absolute: 0 10*3/uL (ref 0.0–0.5)
Eosinophils Relative: 0 %
HCT: 39.8 % (ref 36.0–46.0)
Hemoglobin: 13.5 g/dL (ref 12.0–15.0)
Immature Granulocytes: 0 %
Lymphocytes Relative: 23 %
Lymphs Abs: 1.5 10*3/uL (ref 0.7–4.0)
MCH: 34 pg (ref 26.0–34.0)
MCHC: 33.9 g/dL (ref 30.0–36.0)
MCV: 100.3 fL — ABNORMAL HIGH (ref 80.0–100.0)
Monocytes Absolute: 0.3 10*3/uL (ref 0.1–1.0)
Monocytes Relative: 5 %
Neutro Abs: 4.5 10*3/uL (ref 1.7–7.7)
Neutrophils Relative %: 71 %
Platelet Count: 285 10*3/uL (ref 150–400)
RBC: 3.97 MIL/uL (ref 3.87–5.11)
RDW: 11.5 % (ref 11.5–15.5)
WBC Count: 6.4 10*3/uL (ref 4.0–10.5)
nRBC: 0 % (ref 0.0–0.2)

## 2022-04-03 LAB — IRON AND IRON BINDING CAPACITY (CC-WL,HP ONLY)
Iron: 112 ug/dL (ref 28–170)
Saturation Ratios: 31 % (ref 10.4–31.8)
TIBC: 358 ug/dL (ref 250–450)
UIBC: 246 ug/dL (ref 148–442)

## 2022-04-03 LAB — FERRITIN: Ferritin: 95 ng/mL (ref 11–307)

## 2022-04-03 NOTE — Progress Notes (Signed)
Hematology and Oncology Follow Up Visit  [name] [name] [id number] 1975-10-28 46 y.o. 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 [name] [name] is here today for follow-up. [name] [name] states that [name] [name] was recently diagnosed with Lupus as well as Tuberculosis. [name] [name] has started Plaquenil 200 mg Po daily for Lupus and is completing a 9 month course of Nydrazid for the TB.  [name] [name] has some mild SOB with exertion. [name] [name] will take a break to rest when needed.  [name] [name] also has occasional nausea that comes and goes.  No fever, chills, vomiting, cough, rash, dizziness, chest pain, palpitations, abdominal pain or changes in bowel or bladder habits at this time.  Recent ferritin was > 200. TIBC pending.  No falls or syncope.  Appetite and hydration are good. Weight is stable at 120 lbs.   ECOG Performance Status: 1 - Symptomatic but completely ambulatory  Medications:  Allergies as of 04/03/2022       Reactions   Sulfa Antibiotics Anaphylaxis, Hives   Morphine Other (See Comments)   aggressiveness   Morphine And Related Itching, Other (See Comments)   hallucinations   Erythromycin Other (See Comments)   Unknown reaction from childhood        Medication List        Accurate as of April 03, 2022  1:31 PM. If you have any questions, ask your nurse or doctor.          DULoxetine 30 MG capsule Commonly known as: CYMBALTA Take 30 mg by mouth daily.   Emgality 120 MG/ML Soaj Generic drug: Galcanezumab-gnlm Inject 120 mg into the skin every 28 (twenty-eight) days.   fluconazole 150 MG tablet Commonly known as: Diflucan Take  1 tablet (150 mg total) by mouth daily.   gabapentin 100 MG capsule Commonly known as: NEURONTIN   hydroxychloroquine 200 MG tablet Commonly known as: PLAQUENIL Take 200 mg by mouth daily.   isoniazid 300 MG tablet Commonly known as: NYDRAZID Take 1 tablet (300 mg total) by mouth daily.   mupirocin ointment 2 % Commonly known as: BACTROBAN Place 1 Application into the nose 2 (two) times daily. For 5 days   norethindrone 0.35 MG tablet Commonly known as: MICRONOR Take 1 tablet by mouth daily.   pyridOXINE 50 MG tablet Commonly known as: B-6 Take 1 tablet (50 mg total) by mouth daily.   SUMAtriptan 50 MG tablet Commonly known as: IMITREX Take 1 tablet (50 mg total) by mouth as needed for migraine. May repeat in 2 hours if headache persists or recurs.  Maximum 2 tablets in 24 hours.   Vyvanse 70 MG capsule Generic drug: lisdexamfetamine Take 70 mg by mouth daily.        Allergies:  Allergies  Allergen Reactions   Sulfa Antibiotics Anaphylaxis and Hives   Morphine Other (See Comments)    aggressiveness   Morphine And Related Itching and Other (See Comments)    hallucinations   Erythromycin Other (See Comments)    Unknown reaction from childhood    Past Medical History, Surgical history, Social history, and Family History were reviewed and updated.  Review of Systems: All other 10 point review of systems is negative.   Physical Exam:  height is 5\' 3"  (1.6 m) and weight is 120 lb 1.9 oz (54.5 kg). Her oral temperature is 98.5 F (36.9 C). Her blood pressure is 134/75 and her pulse is 105 (abnormal). Her respiration is 19 and oxygen saturation is 100%.   Wt Readings from Last 3 Encounters:  04/03/22 120 lb 1.9 oz (54.5 kg)  03/22/22 120 lb 9.6 oz (54.7 kg)  02/22/22 123 lb (55.8 kg)    Ocular: Sclerae unicteric, pupils equal, round and reactive to light Ear-nose-throat: Oropharynx clear, dentition fair Lymphatic: No cervical or supraclavicular  adenopathy Lungs no rales or rhonchi, good excursion bilaterally Heart regular rate and rhythm, no murmur appreciated Abd soft, nontender, positive bowel sounds MSK no focal spinal tenderness, no joint edema Neuro: non-focal, well-oriented, appropriate affect Breasts: Deferred   Lab Results  Component Value Date   WBC 6.4 04/03/2022   HGB 13.5 04/03/2022   HCT 39.8 04/03/2022   MCV 100.3 (H) 04/03/2022   PLT 285 04/03/2022   Lab Results  Component Value Date   FERRITIN 63 11/30/2021   IRON 119 11/30/2021   TIBC 357 11/30/2021   UIBC 238 11/30/2021   IRONPCTSAT 33 (H) 11/30/2021   Lab Results  Component Value Date   RETICCTPCT 1.9 01/13/2019   RBC 3.97 04/03/2022   No results found for: "KPAFRELGTCHN", "LAMBDASER", "KAPLAMBRATIO" Lab Results  Component Value Date   IGA 264 12/19/2006   No results found for: "TOTALPROTELP", "ALBUMINELP", "A1GS", "A2GS", "BETS", "BETA2SER", "GAMS", "MSPIKE", "SPEI"   Chemistry      Component Value Date/Time   NA 142 11/30/2021 1420   K 3.7 11/30/2021 1420   CL 104 11/30/2021 1420   CO2 27 11/30/2021 1420   BUN 10 11/30/2021 1420   CREATININE 0.87 11/30/2021 1420   CREATININE 0.86 10/16/2017 1454      Component Value Date/Time   CALCIUM 9.7 11/30/2021 1420   ALKPHOS 46 11/30/2021 1420   AST 34 (H) 02/22/2022 1146   AST 23 11/30/2021 1420   ALT 30 (H) 02/22/2022 1146   ALT 15 11/30/2021 1420   BILITOT 0.6 02/22/2022 1146   BILITOT 0.4 11/30/2021 1420       Impression and Plan: [name] [name] is a very pleasant 46 yo caucasian female heterozygous for the Prothrombin II gene mutation with bilateral pulmonary emboli. [name] [name] also has hemochromatosis, heterozygote for H63D.  Iron studies are pending. We will set her up for phlebotomy if needed. [name] [name] recently moved to [place] with her family so we will see if [name] [name] can do her phlebotomy and replacement fluids at [facility].  Follow-up in 4 months.   [facility], NP 12/11/20231:31 PM

## 2022-04-04 ENCOUNTER — Telehealth: Payer: Self-pay | Admitting: Family

## 2022-04-04 NOTE — Telephone Encounter (Signed)
Iron studies reviewed with patient. We will hold phlebotomy for now. She would like to just watch for now which is fine. We will recheck in April unless she has any issues beforehand. We can certainly see her sooner if needed.

## 2022-04-10 ENCOUNTER — Other Ambulatory Visit (HOSPITAL_COMMUNITY): Payer: Self-pay

## 2022-04-10 ENCOUNTER — Telehealth: Payer: Self-pay

## 2022-04-10 NOTE — Telephone Encounter (Signed)
Patient Advocate Encounter  Prior Authorization for Emgality 120MG/ML auto-injectors (migraine) has been approved through [employer].    Effective: 04-06-2022 to 07-06-2022  

## 2022-04-24 ENCOUNTER — Other Ambulatory Visit: Payer: Self-pay | Admitting: Hematology & Oncology

## 2022-06-24 IMAGING — MG DIGITAL SCREENING BREAST BILAT IMPLANT W/ TOMO W/ CAD
9 of 12 series · 9 of 28 positions shown · non-contrast
Comparison: Previous exam(s).

CLINICAL DATA: Screening.

EXAM:
DIGITAL SCREENING BILATERAL MAMMOGRAM WITH IMPLANTS, CAD AND
TOMOSYNTHESIS
TECHNIQUE: Bilateral screening digital craniocaudal and mediolateral oblique
mammograms were obtained. Bilateral screening digital breast
tomosynthesis was performed. The images were evaluated with
computer-aided detection. Standard and/or implant displaced views
were performed.

[L MLO]
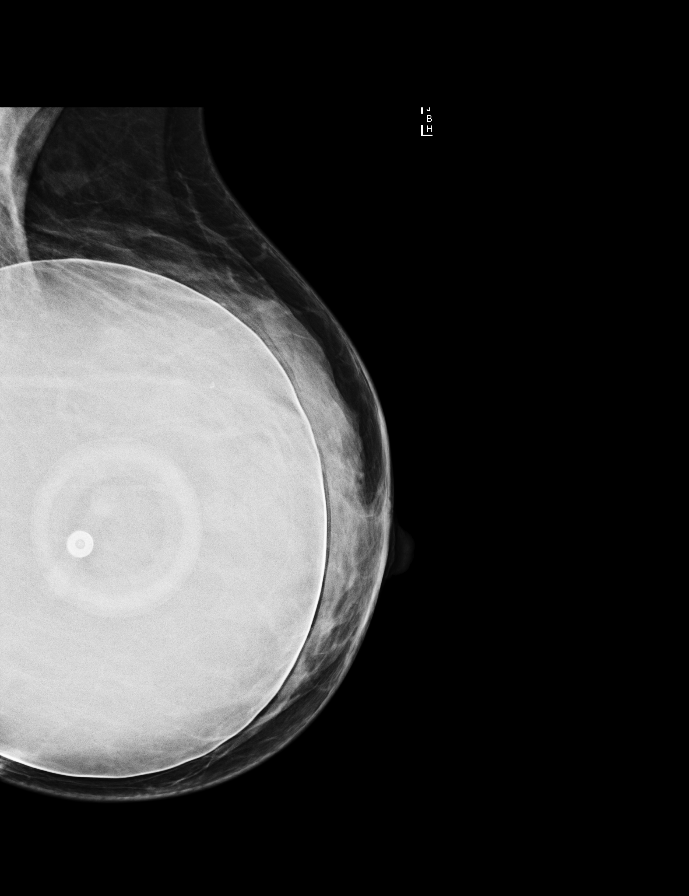

[R CC]
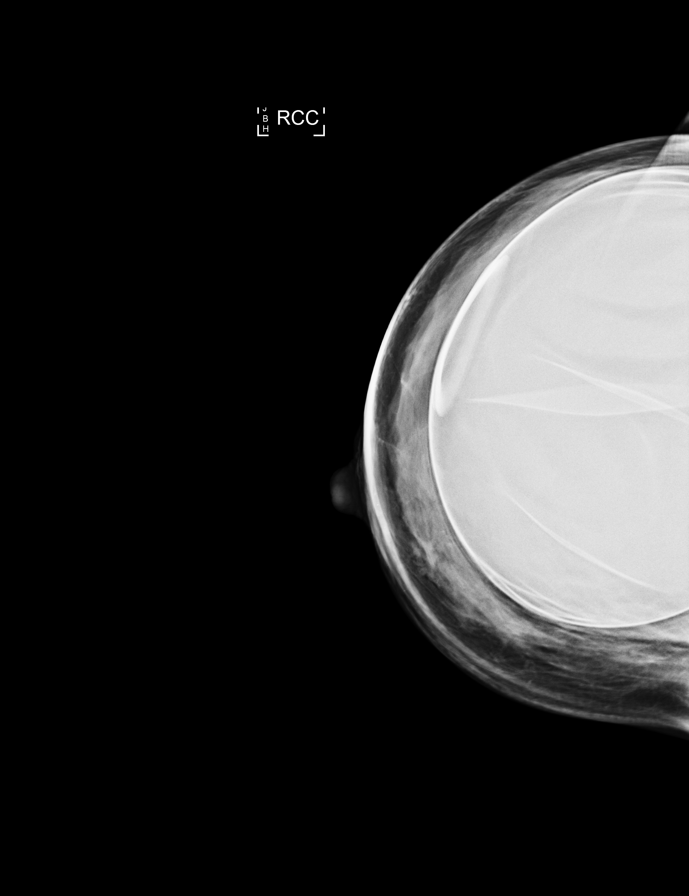

[R MLO]
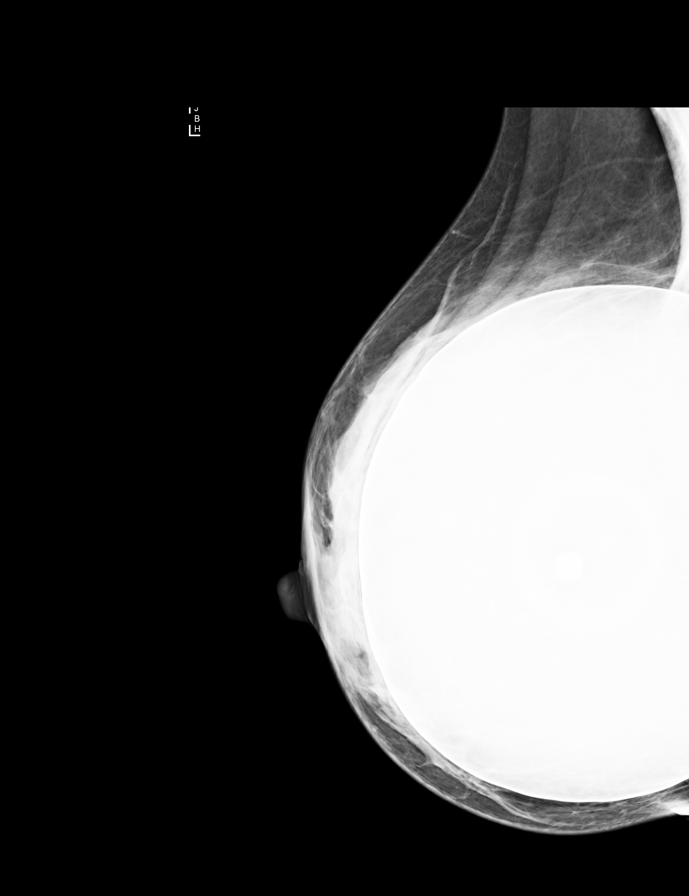

[L CC]
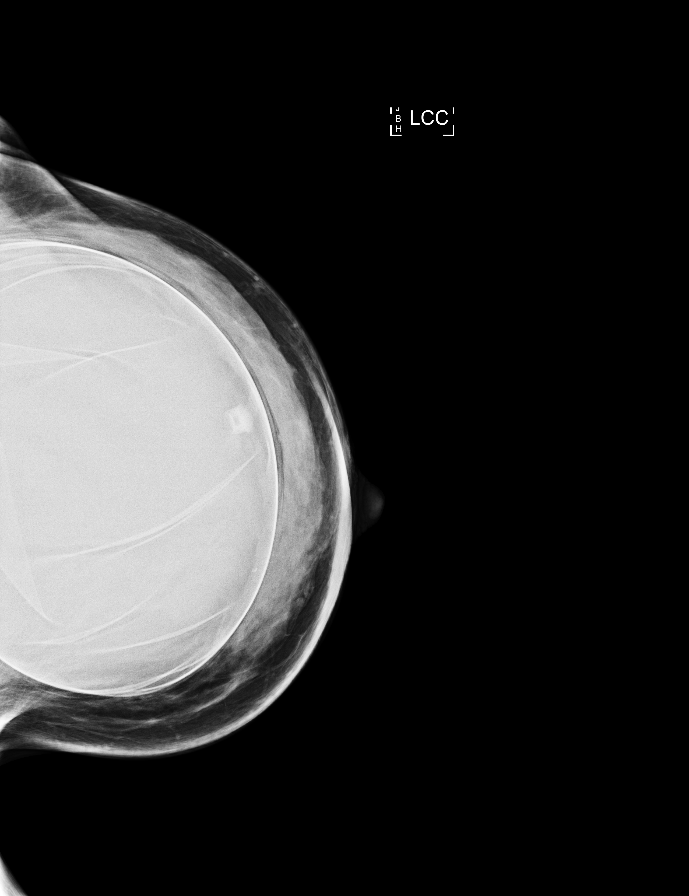

[L CC synth-2D]
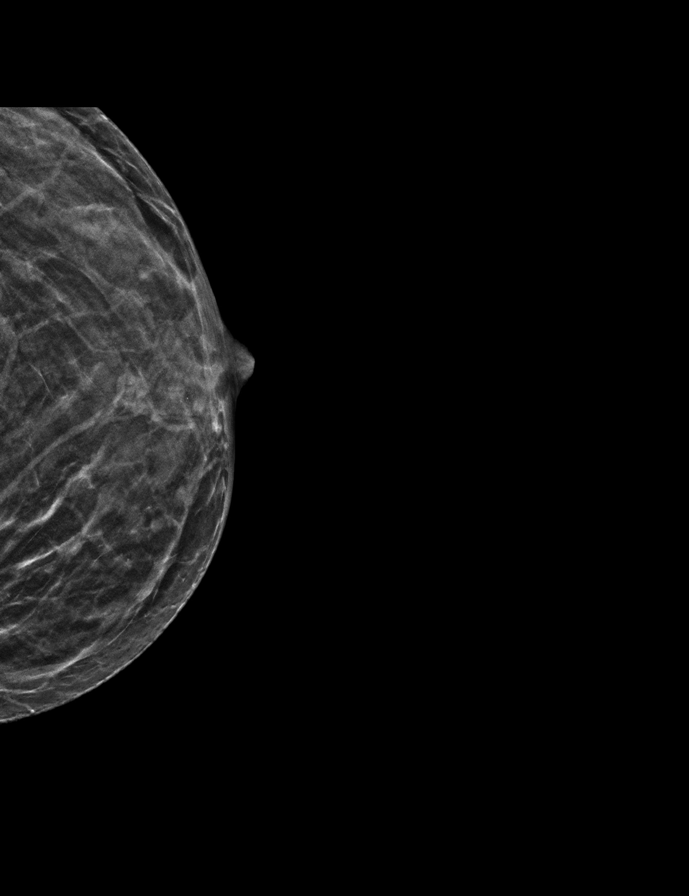

[R MLO synth-2D]
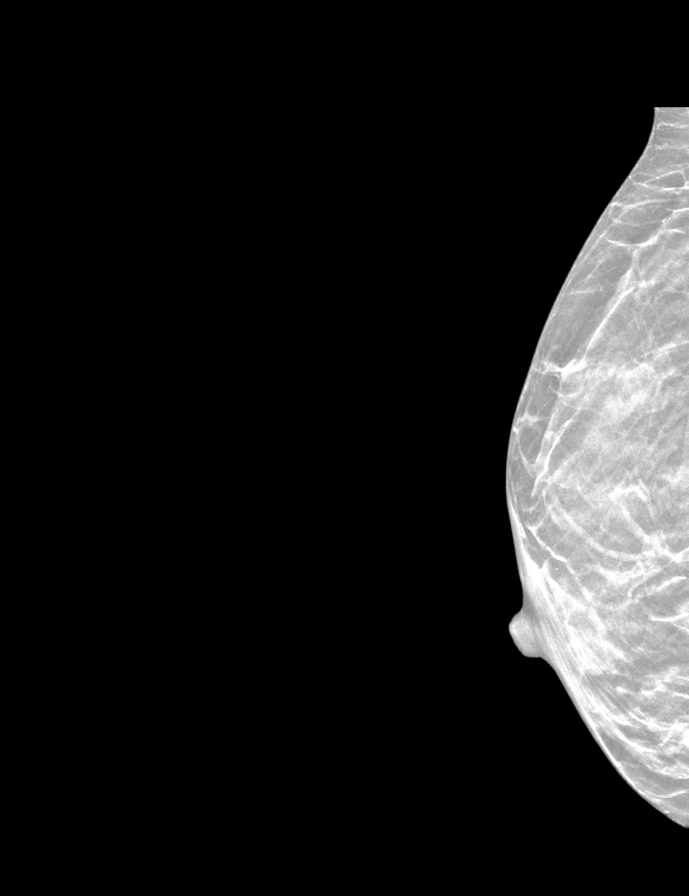

[L MLO synth-2D]
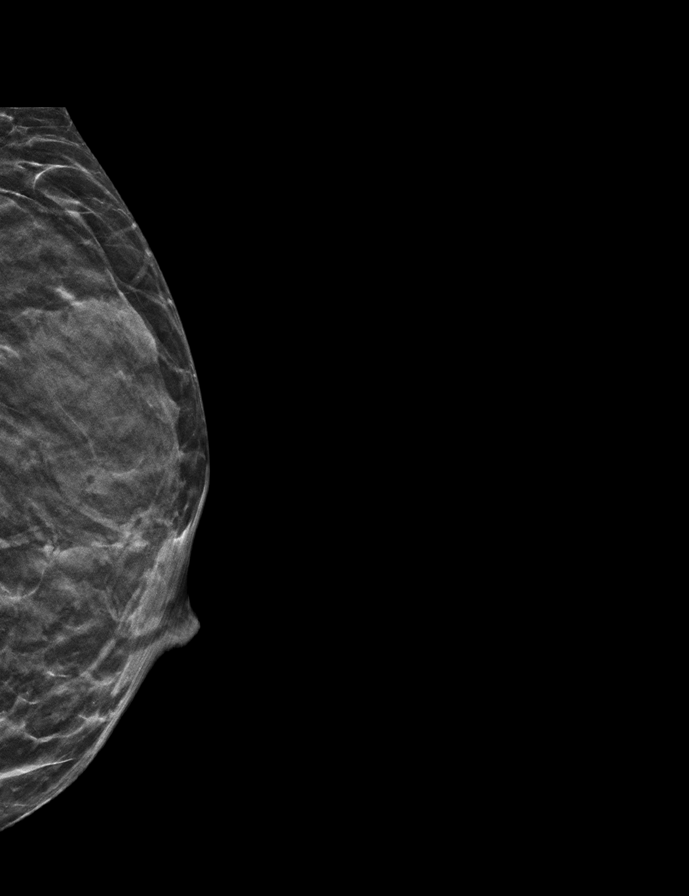

[R CC synth-2D]
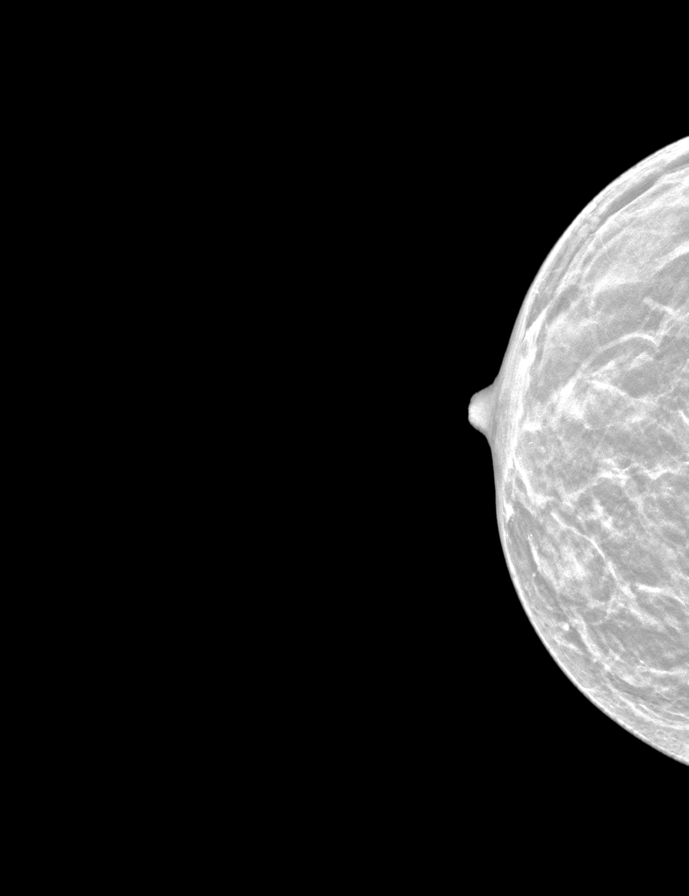

[R CCID BREAST TOMOSYNTHESIS IMAGE tomo · tomo slice 16/31.0]
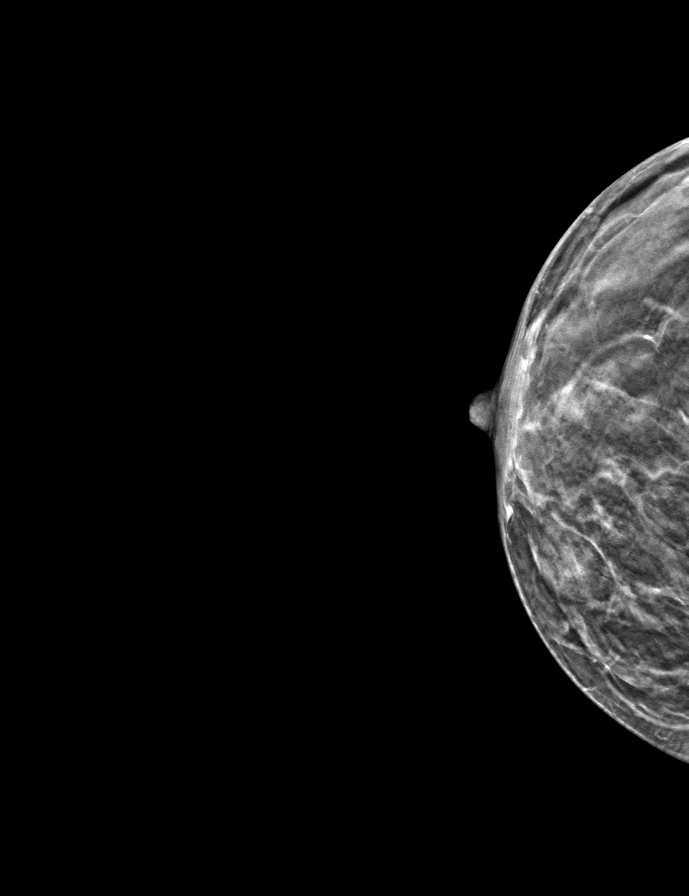

[9 of 28 positions shown; findings below may reference images not displayed]

ACR Breast Density Category c: The breast tissue is heterogeneously
dense, which may obscure small masses.
FINDINGS: The patient has retropectoral saline implants. There are no findings
suspicious for malignancy.
IMPRESSION: No mammographic evidence of malignancy. A result letter of this
screening mammogram will be mailed directly to the patient.

RECOMMENDATION:
Screening mammogram in one year. (Code:FQ-5-EOL)

BI-RADS CATEGORY  1:  Negative.

## 2022-07-25 NOTE — Progress Notes (Unsigned)
Virtual Visit via Video Note  Consent was obtained for video visit:  Yes.   Answered questions that patient had about telehealth interaction:  Yes.   I discussed the limitations, risks, security and privacy concerns of performing an evaluation and management service by telemedicine. I also discussed with the patient that there may be a patient responsible charge related to this service. The patient expressed understanding and agreed to proceed.  Pt location: Home Physician Location: office Name of referring provider:  No ref. provider found I connected with [name] [name] at patients initiation/request on 07/26/2022 at  9:50 AM EDT by video enabled telemedicine application and verified that I am speaking with the correct person using two identifiers. Pt MRN:  [id number] Pt DOB:  10-Jun-1975 Video Participants:  [name] [name]  Assessment/Plan:    Recurrent prolonged episodes of various symptomatology, including paresthesias, numbness, neuralgia, myalgia, polyarthralgia, weakness.   Symptoms seem more consistent with some underlying autoimmune disorder 2. Migraine without aura, without status migrainosus, not intractable 3  Ocular migraine   Migraine prevention:  Start Emgality Migraine rescue:  sumatriptan 25-50mg  as needed Limit use of pain relievers to no more than 2 days out of week to prevent risk of rebound or medication-overuse headache. Keep headache diary Follow up in 4-5 months.   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 Recent history supplemented by her accompanying husband and rheumatologist's note.    UPDATE: Started on Emgality. Intensity:  *** Duration:  *** with sumatriptan Frequency:  ***  Current migraine rescue:  sumatriptan 25-50mg  Current  migraine preventative:  Emgality   HISTORY: She has had intermittent episodes of numbness, paresthesias, neuralgia, muscle pain, polyarthralgia and weakness since 2008.  It typically starts with numbness and tingling in the right thigh and then spreads to all extremities, in patchy pattern.  It may occur in different extremities at a time.  Work-up at that time, including MRI and EEG, were negative.  It lasted a few weeks.  She had another episode a couple of years later, lasting 2 weeks.  She had a recurrence in 2016, lasting 2 to 3 months. In June 2020, she started having a recurrence, beginning with paresthesias in the right thigh and subsequently spread to her hands, face and feet with associated neuralgia.  She reports burning sensation in her face, like a sunburn.  Initially, her cheeks were flushed and skin peeling, which was new.  The pain feels like hot pokers in her legs.  She reports diffuse muscle soreness and joint pain.  She has trouble running or even picking up her 45 year old.  Sometimes the numbness in her foot is so severe that she has trouble feeling the gas pedal when she drives.  She initially reported chest pain and palpitations.   Labs from 01/07/2019 demonstrated B12 of 248.  She started B12 injections.  Other labs were unremarkable, including CBC,  CMP, folate 12.9, TSH 0.87, vitamin D 71.06, sed rate 1, CRP <1.0, RF negative, cyclic citrullin peptide antibody negative and magnesium 2.0.  However. jer iron level was elevated at 233 and she was diagnosed with hemochromatosis..  Covid testing was also negative.  She reported family history of multiple sclerosis, so MRI of brain and cervical spine with and without contrast was performed on 04/16/2019, which were unremarkable. She  was subsequently diagnosed with hemochromatosis and has since started phlebotomy.  Despite starting therapy for low B12 and hemochromatosis, no resolution of symptoms.  This is the longest period of time that she  continued to experience symptoms.  She has history of migraines since childhood, and they tend to be more frequent during these episodes.  Migraines are typically severe, behind eyes, right sided/bilateral, associated with photophobia and phonophobia, and often triggered by menstrual cycle or change in weather.  She treats with Fioricet.  On occasion, she has had ocular migraines without headache as well (halo-effect over objects and then objects disappear).  She once had a thunderclap headache.  Labs performed at that time revealed CK 41, aldolase 3.5, negative Lyme IgG/IgM antibody, negative ANCA screen, B6 9, B12 787 and low B1 of 7.  She was advised to start thiamine 100mg  daily but symptoms never improved.  NCV-EMG was ordered which she never had performed and was subsequently lost to follow up.  Since that visit in 2021, symptoms have gotten worse.  She reports increased fatigue.  Numbness and tinging is worse.  Ongoing itching.  She may get hives from time to time but no lingering rash.  She reports tingling and stinging from the waist up, including the face..  Touching skin shoots electric pain.It still occurs off and on.  No consistency to duration or frequency.  But they are lasting longer.  This current flare up has been ongoing for 6 months.  Change in diet, such as gluten - free has not helped.  She reports joint pain, muscle pain.  Does not have night sweats (actually she is cold at night), but wakes up in morning drenched. In September 2023, she has followed up with rheumatology.  She had a battery of tests checked including ANA, ESR, CRP, APS, ANCA, B12, D, TSH, CK, CBC, CMP, UA, UPCR which reportedly showed mildly positive ANA but overall unremarkable.  Labs, however, did demonstrate Quantiferon-TB Gold Plus consistent with latent-TB.  She followed up with ID and was placed on INH.  Started on Plaquenil for presumed lupus. NCV-EMG on 02/21/2022 showed evidence of very mild right median neuropathy  at/distal to the wrist, but no evidence of diffuse sensorimotor polyneuropathy, myopathy or cervical/lumbosacral radiculopathy.   She reports that her 69 year old daughter has similar symptoms, which she calls "growing pains".  Other notable family history:  Mother -fibromyalgia; maternal aunt - multiple sclerosis; first cousin - multiple sclerosis and lupus.   She has hemochromatosis and prothrombin gene mutation and is followed by heme/onc for hemochromatosis.  She has had bilateral PEs.  Labs, including CBC, CMP, iron and ferritin, have overall  been unremarkable.       Past triptans:  Zomig (side effects serotonin syndrome), sumatriptan 25mg  (effective), oxycodone Past preventatives:  Propranolol, amitriptyline,     Past Medical History: Past Medical History:  Diagnosis Date   AMA (advanced maternal age) multigravida 35+    Arrhythmia    Mobitz type 1   Chicken pox    Depression    Depression with anxiety    Frequent headaches    Heart murmur    as a child   Hemochromatosis, hereditary ([place]) 04/03/2019   Pulmonary embolism ([place])    Tachycardia    as a child   Urinary incontinence     Medications: Outpatient Encounter Medications as of 07/26/2022  Medication Sig   DULoxetine (CYMBALTA) 30 MG capsule Take 30 mg by mouth daily.   fluconazole (DIFLUCAN) 150 MG  tablet Take 1 tablet (150 mg total) by mouth daily.   gabapentin (NEURONTIN) 100 MG capsule    Galcanezumab-gnlm (EMGALITY) 120 MG/ML SOAJ Inject 120 mg into the skin every 28 (twenty-eight) days.   hydroxychloroquine (PLAQUENIL) 200 MG tablet Take 200 mg by mouth daily.   isoniazid (NYDRAZID) 300 MG tablet Take 1 tablet (300 mg total) by mouth daily.   mupirocin ointment (BACTROBAN) 2 % Place 1 Application into the nose 2 (two) times daily. For 5 days   norethindrone (MICRONOR) 0.35 MG tablet Take 1 tablet by mouth daily.   pyridOXINE (B-6) 50 MG tablet Take 1 tablet (50 mg total) by mouth daily.   SUMAtriptan (IMITREX)  50 MG tablet Take 1 tablet (50 mg total) by mouth as needed for migraine. May repeat in 2 hours if headache persists or recurs.  Maximum 2 tablets in 24 hours.   VYVANSE 70 MG capsule Take 70 mg by mouth daily.   No facility-administered encounter medications on file as of 07/26/2022.    Allergies: Allergies  Allergen Reactions   Sulfa Antibiotics Anaphylaxis and Hives   Morphine Other (See Comments)    aggressiveness   Morphine And Related Itching and Other (See Comments)    hallucinations   Erythromycin Other (See Comments)    Unknown reaction from childhood    Family History: Family History  Problem Relation Age of Onset   Diabetes Mother    Arthritis Mother    Depression Mother    Stroke Mother    Miscarriages / [place] Mother    Mental illness Mother    Migraines Father    Hyperlipidemia Father    Hypertension Father    Alcohol abuse Father    Arthritis Father    Depression Father    Drug abuse Father    Hearing loss Father    Mental illness Sister    Asthma Brother    Depression Brother    Multiple sclerosis Maternal Aunt    Seizures Paternal Aunt    Cancer Paternal Aunt    Breast cancer Paternal Aunt    Depression Maternal Grandmother    Diabetes Maternal Grandmother    Heart disease Maternal Grandfather    Kidney disease Maternal Grandfather    Stroke Maternal Grandfather    Cancer Paternal Grandmother    Ovarian cancer Paternal Grandmother    Breast cancer Paternal Grandmother    Alcohol abuse Paternal Grandfather    Asthma Paternal Grandfather    Depression Paternal Grandfather    Drug abuse Paternal Grandfather    Hearing loss Paternal Grandfather    Heart disease Paternal Grandfather    Hyperlipidemia Paternal Grandfather    Mental illness Paternal Grandfather    Migraines Son    Asthma Son    Migraines Daughter    Multiple sclerosis Cousin     Observations/Objective:   *** No acute distress.  Alert and oriented.  Speech fluent and not  dysarthric.  Language intact.  Eyes orthophoric on primary gaze.  Face symmetric.   Follow Up Instructions:    -I discussed the assessment and treatment plan with the patient. The patient was provided an opportunity to ask questions and all were answered. The patient agreed with the plan and demonstrated an understanding of the instructions.   The patient was advised to call back or seek an in-person evaluation if the symptoms worsen or if the condition fails to improve as anticipated.   Dudley Major, DO

## 2022-07-26 ENCOUNTER — Telehealth: Payer: BLUE CROSS/BLUE SHIELD | Admitting: Neurology

## 2022-07-26 ENCOUNTER — Encounter: Payer: Self-pay | Admitting: Neurology

## 2022-07-26 ENCOUNTER — Other Ambulatory Visit: Payer: Self-pay

## 2022-07-26 DIAGNOSIS — G43009 Migraine without aura, not intractable, without status migrainosus: Secondary | ICD-10-CM | POA: Diagnosis not present

## 2022-07-26 DIAGNOSIS — G43109 Migraine with aura, not intractable, without status migrainosus: Secondary | ICD-10-CM | POA: Diagnosis not present

## 2022-07-26 DIAGNOSIS — R202 Paresthesia of skin: Secondary | ICD-10-CM

## 2022-07-26 DIAGNOSIS — R200 Anesthesia of skin: Secondary | ICD-10-CM

## 2022-07-26 NOTE — Patient Instructions (Signed)
Continue Emgality and sumatriptan Repeat nerve conduction study of right arm Follow up 6 months.

## 2022-08-04 ENCOUNTER — Inpatient Hospital Stay: Payer: BLUE CROSS/BLUE SHIELD | Attending: Hematology & Oncology

## 2022-08-04 ENCOUNTER — Inpatient Hospital Stay: Payer: BLUE CROSS/BLUE SHIELD | Admitting: Family

## 2022-08-04 ENCOUNTER — Telehealth: Payer: Self-pay

## 2022-08-04 DIAGNOSIS — I2699 Other pulmonary embolism without acute cor pulmonale: Secondary | ICD-10-CM | POA: Diagnosis not present

## 2022-08-04 LAB — CMP (CANCER CENTER ONLY)
ALT: 18 U/L (ref 0–44)
AST: 23 U/L (ref 15–41)
Albumin: 4.6 g/dL (ref 3.5–5.0)
Alkaline Phosphatase: 46 U/L (ref 38–126)
Anion gap: 12 (ref 5–15)
BUN: 13 mg/dL (ref 6–20)
CO2: 26 mmol/L (ref 22–32)
Calcium: 9.5 mg/dL (ref 8.9–10.3)
Chloride: 101 mmol/L (ref 98–111)
Creatinine: 0.84 mg/dL (ref 0.44–1.00)
GFR, Estimated: >60 mL/min (ref >60)
Glucose, Bld: 95 mg/dL (ref 70–99)
Potassium: 3.8 mmol/L (ref 3.5–5.1)
Sodium: 139 mmol/L (ref 135–145)
Total Bilirubin: 0.7 mg/dL (ref 0.3–1.2)
Total Protein: 7.9 g/dL (ref 6.5–8.1)

## 2022-08-04 LAB — CBC WITH DIFFERENTIAL (CANCER CENTER ONLY)
Abs Immature Granulocytes: 0.03 10*3/uL (ref 0.00–0.07)
Basophils Absolute: 0 10*3/uL (ref 0.0–0.1)
Basophils Relative: 0 %
Eosinophils Absolute: 0 10*3/uL (ref 0.0–0.5)
Eosinophils Relative: 0 %
HCT: 40.6 % (ref 36.0–46.0)
Hemoglobin: 13.7 g/dL (ref 12.0–15.0)
Immature Granulocytes: 1 %
Lymphocytes Relative: 23 %
Lymphs Abs: 1.4 10*3/uL (ref 0.7–4.0)
MCH: 33.8 pg (ref 26.0–34.0)
MCHC: 33.7 g/dL (ref 30.0–36.0)
MCV: 100.2 fL — ABNORMAL HIGH (ref 80.0–100.0)
Monocytes Absolute: 0.4 10*3/uL (ref 0.1–1.0)
Monocytes Relative: 6 %
Neutro Abs: 4.3 10*3/uL (ref 1.7–7.7)
Neutrophils Relative %: 70 %
Platelet Count: 323 10*3/uL (ref 150–400)
RBC: 4.05 MIL/uL (ref 3.87–5.11)
RDW: 11.9 % (ref 11.5–15.5)
WBC Count: 6.1 10*3/uL (ref 4.0–10.5)
nRBC: 0 % (ref 0.0–0.2)

## 2022-08-04 LAB — FERRITIN: Ferritin: 109 ng/mL (ref 11–307)

## 2022-08-04 LAB — IRON AND IRON BINDING CAPACITY (CC-WL,HP ONLY)
Iron: 190 ug/dL — ABNORMAL HIGH (ref 28–170)
Saturation Ratios: 49 % — ABNORMAL HIGH (ref 10.4–31.8)
TIBC: 386 ug/dL (ref 250–450)
UIBC: 196 ug/dL (ref 148–442)

## 2022-08-04 NOTE — Progress Notes (Signed)
Hematology and Oncology Follow Up Visit  [name] [name] [id number] 1975-10-08 47 y.o. 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 She is doing quite well since starting Plaquenil. Her joint aches and pains are much improved.  No fever, chills, n/v, cough, rash, dizziness, SOB, chest pain, palpitations, abdominal pain or changes in bowel or bladder habits.  No swelling in her extremities at this time.  Occasional tingling in her fingertips.  No falls or syncope reported.  Appetite and hydration are good. Weight is stable at 122 lbs.   ECOG Performance Status: 1 - Symptomatic but completely ambulatory  Medications:  Allergies as of 08/04/2022       Reactions   Sulfa Antibiotics Anaphylaxis, Hives   Morphine Other (See Comments)   aggressiveness   Morphine And Related Itching, Other (See Comments)   hallucinations   Erythromycin Other (See Comments)   Unknown reaction from childhood        Medication List        Accurate as of August 04, 2022 10:03 AM. If you have any questions, ask your nurse or doctor.          Emgality 120 MG/ML Soaj Generic drug: Galcanezumab-gnlm Inject 120 mg into the skin every 28 (twenty-eight) days.   fluconazole 150 MG tablet Commonly known as: Diflucan Take 1 tablet (150 mg total) by mouth daily.   hydroxychloroquine 200 MG tablet Commonly known as: PLAQUENIL Take 200 mg by mouth daily.   isoniazid 300 MG tablet Commonly known as: NYDRAZID Take 1 tablet (300 mg total) by mouth daily.   mupirocin ointment 2 % Commonly known as: BACTROBAN Place 1  Application into the nose 2 (two) times daily. For 5 days   norethindrone 0.35 MG tablet Commonly known as: MICRONOR Take 1 tablet by mouth daily.   pyridOXINE 50 MG tablet Commonly known as: B-6 Take 1 tablet (50 mg total) by mouth daily.   SUMAtriptan 50 MG tablet Commonly known as: IMITREX Take 1 tablet (50 mg total) by mouth as needed for migraine. May repeat in 2 hours if headache persists or recurs.  Maximum 2 tablets in 24 hours.   Vyvanse 70 MG capsule Generic drug: lisdexamfetamine Take 70 mg by mouth daily.        Allergies:  Allergies  Allergen Reactions   Sulfa Antibiotics Anaphylaxis and Hives   Morphine Other (See Comments)    aggressiveness   Morphine And Related Itching and Other (See Comments)    hallucinations   Erythromycin Other (See Comments)    Unknown reaction from childhood    Past Medical History, Surgical history, Social history, and Family History were reviewed and updated.  Review of Systems: All other 10 point review of systems is negative.   Physical Exam:  vitals were not taken for this visit.   Wt Readings from Last 3 Encounters:  04/03/22 120 lb 1.9 oz (54.5 kg)  03/22/22 120 lb 9.6 oz (54.7 kg)  02/22/22 123 lb (55.8 kg)    Ocular: Sclerae unicteric, pupils equal, round and reactive to light Ear-nose-throat: Oropharynx clear, dentition fair  Lymphatic: No cervical or supraclavicular adenopathy Lungs no rales or rhonchi, good excursion bilaterally Heart regular rate and rhythm, no murmur appreciated Abd soft, nontender, positive bowel sounds MSK no focal spinal tenderness, no joint edema Neuro: non-focal, well-oriented, appropriate affect Breasts: Deferred   Lab Results  Component Value Date   WBC 6.1 08/04/2022   HGB 13.7 08/04/2022   HCT 40.6 08/04/2022   MCV 100.2 (H) 08/04/2022   PLT 323 08/04/2022   Lab Results  Component Value Date   FERRITIN 95 04/03/2022   IRON 112 04/03/2022   TIBC 358 04/03/2022   UIBC  246 04/03/2022   IRONPCTSAT 31 04/03/2022   Lab Results  Component Value Date   RETICCTPCT 1.9 01/13/2019   RBC 4.05 08/04/2022   No results found for: "KPAFRELGTCHN", "LAMBDASER", "KAPLAMBRATIO" Lab Results  Component Value Date   IGA 264 12/19/2006   No results found for: "TOTALPROTELP", "ALBUMINELP", "A1GS", "A2GS", "BETS", "BETA2SER", "GAMS", "MSPIKE", "SPEI"   Chemistry      Component Value Date/Time   NA 140 04/03/2022 1300   K 4.0 04/03/2022 1300   CL 103 04/03/2022 1300   CO2 28 04/03/2022 1300   BUN 9 04/03/2022 1300   CREATININE 0.90 04/03/2022 1300   CREATININE 0.86 10/16/2017 1454      Component Value Date/Time   CALCIUM 9.7 04/03/2022 1300   ALKPHOS 57 04/03/2022 1300   AST 32 04/03/2022 1300   ALT 27 04/03/2022 1300   BILITOT 0.4 04/03/2022 1300       Impression and Plan: [name] [name] is a very pleasant 47 yo caucasian female heterozygous for the Prothrombin II gene mutation with bilateral pulmonary emboli. She also has hemochromatosis, heterozygote for H63D.  Iron studies are pending. We will set her up for phlebotomy if needed. She lives in [place] with her family so we will see if she can do her phlebotomy and replacement fluids at [facility].  Follow-up in 6 months.  Eileen Stanford, NP 4/12/202410:03 AM

## 2022-08-04 NOTE — Telephone Encounter (Signed)
PA request received via CMM for Emgality 120MG /ML auto-injectors (migraine)  PA has been submitted to [employer] and is pending determination  Key: [id number]

## 2022-08-09 ENCOUNTER — Telehealth: Payer: Self-pay | Admitting: *Deleted

## 2022-08-09 NOTE — Telephone Encounter (Signed)
Per scheduling message Maralyn Sago - Called [facility] and lvm for a call back to get patient scheduled.

## 2022-08-10 ENCOUNTER — Telehealth: Payer: Self-pay | Admitting: *Deleted

## 2022-08-10 NOTE — Telephone Encounter (Signed)
Second call - Per scheduling message Maralyn Sago - Called [facility] and lvm for a call back to get patient scheduled.

## 2022-08-14 NOTE — Telephone Encounter (Signed)
PA Approved

## 2022-08-16 ENCOUNTER — Other Ambulatory Visit: Payer: Self-pay | Admitting: Family

## 2022-08-16 ENCOUNTER — Inpatient Hospital Stay: Payer: BLUE CROSS/BLUE SHIELD

## 2022-08-16 MED ORDER — SODIUM CHLORIDE 0.9 % IV SOLN: INTRAVENOUS | Status: DC

## 2022-08-16 NOTE — Patient Instructions (Signed)

## 2022-08-16 NOTE — Progress Notes (Signed)
[name] [name] presents today for phlebotomy per MD orders. Phlebotomy procedure started at 09:50 and ended at 1005. 535 cc removed via 18 G needle at [facility] site. IV fluids replacement given. Patient tolerated procedure well. IV needle removed intact.

## 2022-08-29 ENCOUNTER — Other Ambulatory Visit: Payer: Self-pay

## 2022-08-29 ENCOUNTER — Ambulatory Visit: Payer: BLUE CROSS/BLUE SHIELD | Admitting: Internal Medicine

## 2022-08-29 ENCOUNTER — Encounter: Payer: Self-pay | Admitting: Internal Medicine

## 2022-08-29 VITALS — BP 125/78 | HR 69 | Temp 98.0°F | Ht 63.0 in | Wt 123.0 lb

## 2022-08-29 DIAGNOSIS — Z5181 Encounter for therapeutic drug level monitoring: Secondary | ICD-10-CM | POA: Diagnosis not present

## 2022-08-29 DIAGNOSIS — E531 Pyridoxine deficiency: Secondary | ICD-10-CM | POA: Diagnosis not present

## 2022-08-29 DIAGNOSIS — Z227 Latent tuberculosis: Secondary | ICD-10-CM | POA: Diagnosis not present

## 2022-08-29 NOTE — Progress Notes (Signed)
   Subjective:    Patient ID: [name] [name], female    DOB: 08-Nov-1975, 47 y.o.   MRN: [id number]  HPI [name] [name] is here for follow up of latent Tb.  She continues on INH though has taken B6 sporadically.  No rash or diarrhea.  Continues on Plaquinil as well.     Review of Systems  Constitutional:  Negative for fatigue.  Gastrointestinal:  Negative for diarrhea and nausea.  Skin:  Negative for rash.       Objective:   Physical Exam Eyes:     General: No scleral icterus. Pulmonary:     Effort: Pulmonary effort is normal.  Neurological:     Mental Status: She is alert.   SH: no tobacco        Assessment & Plan:

## 2022-08-29 NOTE — Assessment & Plan Note (Signed)
She continues on inh without issues and no complaints.  No side effects so will continue for a total of 9 months.

## 2022-08-29 NOTE — Assessment & Plan Note (Signed)
Recent LFTs wnl, no concerns.

## 2022-08-29 NOTE — Assessment & Plan Note (Signed)
I emphasized to start taking the vitamin again and she will.

## 2022-08-31 ENCOUNTER — Ambulatory Visit: Payer: BLUE CROSS/BLUE SHIELD | Admitting: Neurology

## 2022-08-31 DIAGNOSIS — G5621 Lesion of ulnar nerve, right upper limb: Secondary | ICD-10-CM

## 2022-08-31 DIAGNOSIS — R202 Paresthesia of skin: Secondary | ICD-10-CM | POA: Diagnosis not present

## 2022-08-31 NOTE — Procedures (Signed)
  [facility]  [address] [place], [address]  [place], [place] [address] Tel: [phone] Fax: [phone] Test Date:  08/31/2022  Patient: [name] [name] DOB: 01/01/1976 Physician: Nita Sickle, DO  Sex: Female Height: 5\' 3"  Ref Phys: Shon Millet, DO  ID#: [id number]   Technician:    History: This is a 47 year old female referred for evaluation of right hand parethesias.   NCV & EMG Findings: Extensive electrodiagnostic testing of the right upper extremity shows:  Right mixed palmar sensory responses show prolonged latency.  Right median and ulnar sensory responses are within normal limits. Right median motor response is within normal limits.  Right ulnar motor response shows slowed conduction velocity across the elbow. There is no evidence of active or chronic motor axonal loss changes affecting any of the tested muscles.  Motor unit configuration and recruitment pattern is within normal limits.  Impression: Right ulnar neuropathy with slowing across the elbow, purely demyelinating, mild.  This is new from prior study on 02/21/2022. Right median neuropathy at or distal to the wrist, consistent with a clinical diagnosis of carpal tunnel syndrome.  Overall, these findings are very mild in degree electrically and unchanged.    ___________________________ Nita Sickle, DO    Nerve Conduction Studies   Stim Site NR Peak (ms) Norm Peak (ms) O-P Amp (V) Norm O-P Amp  Right Median Anti Sensory (2nd Digit)  32 C  Wrist    3.1 <3.4 52.3 >20  Right Ulnar Anti Sensory (5th Digit)  32 C  Wrist    2.6 <3.1 45.8 >12     Stim Site NR Onset (ms) Norm Onset (ms) O-P Amp (mV) Norm O-P Amp Site1 Site2 Delta-0 (ms) Dist (cm) Vel (m/s) Norm Vel (m/s)  Right Median Motor (Abd Poll Brev)  32 C  Wrist    3.5 <3.9 13.4 >6 Elbow Wrist 4.5 28.0 62 >50  Elbow    8.0  12.8         Right Ulnar Motor (Abd Dig Minimi)  32 C  Wrist    2.4 <3.1 9.0 >7 B Elbow Wrist 3.0 20.0 62 >50  B Elbow    5.4   8.7  A Elbow B Elbow 2.1 10.0 *48 >50  A Elbow    7.5  7.1            Stim Site NR Peak (ms) Norm Peak (ms) P-T Amp (V) Site1 Site2 Delta-P (ms) Norm Delta (ms)  Right Median/Ulnar Palm Comparison (Wrist - 8cm)  32 C  Median Palm    2.0 <2.2 97.0 Median Palm Ulnar Palm *0.7   Ulnar Palm    1.3 <2.2 31.1       Electromyography   Side Muscle Ins.Act Fibs Fasc Recrt Amp Dur Poly Activation Comment  Right 1stDorInt Nml Nml Nml Nml Nml Nml Nml Nml N/A  Right Abd Poll Brev Nml Nml Nml Nml Nml Nml Nml Nml N/A  Right PronatorTeres Nml Nml Nml Nml Nml Nml Nml Nml N/A  Right Biceps Nml Nml Nml Nml Nml Nml Nml Nml N/A  Right Triceps Nml Nml Nml Nml Nml Nml Nml Nml N/A  Right Deltoid Nml Nml Nml Nml Nml Nml Nml Nml N/A      Waveforms:

## 2022-09-01 NOTE — Progress Notes (Signed)
LMOVM for the patient to call the office back.  °

## 2022-09-05 ENCOUNTER — Other Ambulatory Visit: Payer: Self-pay

## 2022-09-05 DIAGNOSIS — G5621 Lesion of ulnar nerve, right upper limb: Secondary | ICD-10-CM

## 2022-09-05 DIAGNOSIS — R200 Anesthesia of skin: Secondary | ICD-10-CM

## 2022-09-05 DIAGNOSIS — G5601 Carpal tunnel syndrome, right upper limb: Secondary | ICD-10-CM

## 2022-09-20 ENCOUNTER — Encounter: Payer: Self-pay | Admitting: Hematology & Oncology

## 2022-09-20 ENCOUNTER — Other Ambulatory Visit (HOSPITAL_COMMUNITY): Payer: Self-pay

## 2022-09-21 ENCOUNTER — Ambulatory Visit (HOSPITAL_COMMUNITY): Payer: BLUE CROSS/BLUE SHIELD | Attending: Neurology | Admitting: Occupational Therapy

## 2022-09-21 DIAGNOSIS — R200 Anesthesia of skin: Secondary | ICD-10-CM | POA: Insufficient documentation

## 2022-09-21 DIAGNOSIS — G5601 Carpal tunnel syndrome, right upper limb: Secondary | ICD-10-CM | POA: Insufficient documentation

## 2022-09-21 DIAGNOSIS — R202 Paresthesia of skin: Secondary | ICD-10-CM | POA: Insufficient documentation

## 2022-09-21 DIAGNOSIS — M25521 Pain in right elbow: Secondary | ICD-10-CM | POA: Insufficient documentation

## 2022-09-21 DIAGNOSIS — R208 Other disturbances of skin sensation: Secondary | ICD-10-CM | POA: Insufficient documentation

## 2022-09-21 DIAGNOSIS — G5621 Lesion of ulnar nerve, right upper limb: Secondary | ICD-10-CM | POA: Insufficient documentation

## 2022-09-21 NOTE — Therapy (Signed)
Pt presented to OP clinic for R cubital tunnel syndrome and R carpal tunnel syndrome. She reports intermittent numbness and sensation deficits all over her body, however also present in her ring finger and pinky of the R hand. At home pt already utilizes an elbow splint and and wrist splint to decrease symptoms, as well as following recommendations to not rest her elbow on tables/desks and to take breaks during her work day to relieve pressure on the wrist. Pt was found to have full wrist ROM and digit ROM, as well as functional strength in the grip and pinch. OT and pt agree that at this time she has no further need for skilled OT and she will follow up if her needs change.   Trish Mage, OTR/L [facility] [phone]

## 2022-10-28 ENCOUNTER — Other Ambulatory Visit: Payer: Self-pay | Admitting: Neurology

## 2022-11-02 ENCOUNTER — Other Ambulatory Visit: Payer: Self-pay | Admitting: Internal Medicine

## 2022-12-28 ENCOUNTER — Other Ambulatory Visit: Payer: Self-pay | Admitting: Oncology

## 2022-12-28 DIAGNOSIS — Z006 Encounter for examination for normal comparison and control in clinical research program: Secondary | ICD-10-CM

## 2023-01-24 NOTE — Progress Notes (Deleted)
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 Keep headache diary Repeat NCV-EMG of right upper extremity. Follow up in 6 months.  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 Recent history supplemented by her accompanying husband and rheumatologist's note.    UPDATE: Migraines are controlled. Duration:  quick with sumatriptan Frequency:  no migraines since starting Emgality   Repeat NCV-EMG of right upper extremity now demonstrated new mild ulnar neuropathy with slowing across the elbow as well as stable mild carpal tunnel syndrome.  Advised to not lean on elbows and to keep arms straight while sleeping.  ***   Current migraine rescue:  sumatriptan 50mg  Current migraine preventative:  Emgality Other medications:  Vyvanse, Mcronor, isoniazid, Plaquenil   HISTORY: She has had intermittent episodes of numbness, paresthesias, neuralgia, muscle pain, polyarthralgia and weakness since 2008.  It typically starts with numbness and tingling in the right thigh and then spreads to all extremities, in patchy pattern.  It may occur in different extremities at a time.  Work-up at that time, including MRI and EEG, were negative.  It lasted a few weeks.  She had another episode a couple of years later, lasting 2 weeks.  She had a recurrence in 2016, lasting 2 to 3  months. In June 2020, she started having a recurrence, beginning with paresthesias in the right thigh and subsequently spread to her hands, face and feet with associated neuralgia.  She reports burning sensation in her face, like a sunburn.  Initially, her cheeks were flushed and skin peeling, which was new.  The pain feels like hot pokers in her legs.  She reports diffuse muscle soreness and joint pain.  She has trouble running or even picking up her 45 year old.  Sometimes the numbness in her foot is so severe that she has trouble feeling the gas pedal when she drives.  She initially reported chest pain and palpitations.   Labs from 01/07/2019 demonstrated B12 of 248.  She started B12 injections.  Other labs were unremarkable, including CBC,  CMP, folate 12.9, TSH 0.87, vitamin D 71.06, sed rate 1, CRP <1.0, RF negative, cyclic citrullin peptide antibody negative and magnesium 2.0.  However. jer iron level was elevated at 233 and she was diagnosed with hemochromatosis..  Covid testing was also negative.  She reported family history of multiple sclerosis, so MRI of brain and cervical spine with and without contrast was performed on 04/16/2019, which were unremarkable. She was subsequently diagnosed with hemochromatosis and has since started phlebotomy.  Despite starting therapy for low B12 and hemochromatosis, no resolution of symptoms.  This is the longest period of time that she continued to experience symptoms.  She has history of migraines since childhood, and they tend to be more frequent during these episodes.  Migraines are typically severe, behind eyes, right sided/bilateral, associated with photophobia and phonophobia, and often triggered by menstrual cycle or change in weather.  She treats  with Fioricet.  On occasion, she has had ocular migraines without headache as well (halo-effect over objects and then objects disappear).  She once had a thunderclap headache.  Labs performed at that time revealed CK 41,  aldolase 3.5, negative Lyme IgG/IgM antibody, negative ANCA screen, B6 9, B12 787 and low B1 of 7.  She was advised to start thiamine 100mg  daily but symptoms never improved.  NCV-EMG was ordered which she never had performed and was subsequently lost to follow up.  Since that visit in 2021, symptoms have gotten worse.  She reports increased fatigue.  Numbness and tinging is worse.  Ongoing itching.  She may get hives from time to time but no lingering rash.  She reports tingling and stinging from the waist up, including the face..  Touching skin shoots electric pain.It still occurs off and on.  No consistency to duration or frequency.  But they are lasting longer.  This current flare up has been ongoing for 6 months.  Change in diet, such as gluten - free has not helped.  She reports joint pain, muscle pain.  Does not have night sweats (actually she is cold at night), but wakes up in morning drenched. In September 2023, she has followed up with rheumatology.  She had a battery of tests checked including ANA, ESR, CRP, APS, ANCA, B12, D, TSH, CK, CBC, CMP, UA, UPCR which reportedly showed mildly positive ANA but overall unremarkable.  Labs, however, did demonstrate Quantiferon-TB Gold Plus consistent with latent-TB.  She followed up with ID and was placed on INH.  Started on Plaquenil for presumed lupus. NCV-EMG on 02/21/2022 showed evidence of very mild right median neuropathy at/distal to the wrist, but no evidence of diffuse sensorimotor polyneuropathy, myopathy or cervical/lumbosacral radiculopathy.  No definitive autoimmune diagnosis.  Started on Plaquenil and symptoms have improved in regards to fatigue and joint pain.    She reports that her 79 year old daughter has similar symptoms, which she calls "growing pains".  Other notable family history:  Mother -fibromyalgia; maternal aunt - multiple sclerosis; first cousin - multiple sclerosis and lupus.   She has hemochromatosis and prothrombin gene mutation  and is followed by heme/onc for hemochromatosis.  She has had bilateral PEs.  Labs, including CBC, CMP, iron and ferritin, have overall  been unremarkable.       Past triptans:  Zomig (side effects serotonin syndrome), sumatriptan 25mg  (effective), oxycodone Past preventatives:  Propranolol, amitriptyline,  PAST MEDICAL HISTORY: Past Medical History:  Diagnosis Date   AMA (advanced maternal age) multigravida 35+    Arrhythmia    Mobitz type 1   Chicken pox    Depression    Depression with anxiety    Frequent headaches    Heart murmur    as a child   Hemochromatosis, 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/2019   Pulmonary embolism (HCC)    Tachycardia    as a child   Urinary incontinence     MEDICATIONS: Current Outpatient Medications on File Prior to Visit  Medication Sig Dispense Refill   fluconazole (DIFLUCAN) 150 MG tablet Take 1 tablet (150 mg total) by mouth daily. (Patient not taking: Reported on 08/29/2022) 1 tablet 4   Galcanezumab-gnlm (EMGALITY) 120 MG/ML SOAJ Inject 120 mg into the skin every 28 (twenty-eight) days. 1.12 mL 11   hydroxychloroquine (PLAQUENIL) 200 MG tablet Take 200 mg by mouth daily.     isoniazid (NYDRAZID) 300 MG tablet Take 1 tablet (300 mg total) by mouth daily. 30 tablet 8  mupirocin ointment (BACTROBAN) 2 % Place 1 Application into the nose 2 (two) times daily. For 5 days 30 g 3   norethindrone (MICRONOR) 0.35 MG tablet Take 1 tablet by mouth daily.     pyridOXINE (B-6) 50 MG tablet Take 1 tablet (50 mg total) by mouth daily. 30 tablet 8   SUMAtriptan (IMITREX) 50 MG tablet TAKE 1 TABLET (50 MG TOTAL) BY MOUTH AS NEEDED FOR MIGRAINE. MAY REPEAT IN 2 HOURS IF HEADACHE PERSISTS OR RECURS. MAXIMUM 2 TABLETS IN 24 HOURS. 10 tablet 5   VYVANSE 70 MG capsule Take 70 mg by mouth daily.     No current facility-administered medications on file prior to visit.    ALLERGIES: Allergies  Allergen Reactions   Sulfa Antibiotics Anaphylaxis and Hives   Morphine Other  (See Comments)    aggressiveness   Morphine And Codeine Itching and Other (See Comments)    hallucinations   Erythromycin Other (See Comments)    Unknown reaction from childhood    FAMILY HISTORY: Family History  Problem Relation Age of Onset   Diabetes Mother    Arthritis Mother    Depression Mother    Stroke Mother    Miscarriages / [place] Mother    Mental illness Mother    Migraines Father    Hyperlipidemia Father    Hypertension Father    Alcohol abuse Father    Arthritis Father    Depression Father    Drug abuse Father    Hearing loss Father    Mental illness Sister    Asthma Brother    Depression Brother    Multiple sclerosis Maternal Aunt    Seizures Paternal Aunt    Cancer Paternal Aunt    Breast cancer Paternal Aunt    Depression Maternal Grandmother    Diabetes Maternal Grandmother    Heart disease Maternal Grandfather    Kidney disease Maternal Grandfather    Stroke Maternal Grandfather    Cancer Paternal Grandmother    Ovarian cancer Paternal Grandmother    Breast cancer Paternal Grandmother    Alcohol abuse Paternal Grandfather    Asthma Paternal Grandfather    Depression Paternal Grandfather    Drug abuse Paternal Grandfather    Hearing loss Paternal Grandfather    Heart disease Paternal Grandfather    Hyperlipidemia Paternal Grandfather    Mental illness Paternal Grandfather    Migraines Son    Asthma Son    Migraines Daughter    Multiple sclerosis Cousin       Objective:  *** General: No acute distress.  Patient appears ***-groomed.   Head:  Normocephalic/atraumatic Eyes:  Fundi examined but not visualized Neck: supple, no paraspinal tenderness, full range of motion Heart:  Regular rate and rhythm Lungs:  Clear to auscultation bilaterally Back: No paraspinal tenderness Neurological Exam: alert and oriented.  Speech fluent and not dysarthric, language intact.  CN II-XII intact. Bulk and tone normal, muscle strength 5/5 throughout.   Sensation to light touch intact.  Deep tendon reflexes 2+ throughout, toes downgoing.  Finger to nose testing intact.  Gait normal, Romberg negative.   [name] Millet, DO  CC: ***

## 2023-01-25 ENCOUNTER — Ambulatory Visit: Payer: BLUE CROSS/BLUE SHIELD | Admitting: Neurology

## 2023-01-30 ENCOUNTER — Inpatient Hospital Stay: Payer: BLUE CROSS/BLUE SHIELD

## 2023-01-30 ENCOUNTER — Other Ambulatory Visit: Payer: Self-pay

## 2023-01-30 ENCOUNTER — Inpatient Hospital Stay: Payer: BLUE CROSS/BLUE SHIELD | Admitting: Medical Oncology

## 2023-01-30 DIAGNOSIS — I2699 Other pulmonary embolism without acute cor pulmonale: Secondary | ICD-10-CM

## 2023-02-02 ENCOUNTER — Other Ambulatory Visit: Payer: BLUE CROSS/BLUE SHIELD

## 2023-02-02 ENCOUNTER — Ambulatory Visit: Payer: BLUE CROSS/BLUE SHIELD | Admitting: Family

## 2023-02-07 ENCOUNTER — Other Ambulatory Visit (HOSPITAL_COMMUNITY)
Admission: RE | Admit: 2023-02-07 | Discharge: 2023-02-07 | Disposition: A | Discharge status: Home / Self Care | Payer: BLUE CROSS/BLUE SHIELD | Source: Ambulatory Visit | Attending: Oncology | Admitting: Oncology

## 2023-02-07 ENCOUNTER — Other Ambulatory Visit (HOSPITAL_COMMUNITY): Admission: RE | Admit: 2023-02-07 | Payer: BLUE CROSS/BLUE SHIELD | Source: Ambulatory Visit | Admitting: Oncology

## 2023-02-07 DIAGNOSIS — Z006 Encounter for examination for normal comparison and control in clinical research program: Secondary | ICD-10-CM | POA: Insufficient documentation

## 2023-02-16 LAB — HELIX MOLECULAR SCREEN: Genetic Analysis Overall Interpretation: NEGATIVE

## 2023-02-27 ENCOUNTER — Telehealth: Payer: BLUE CROSS/BLUE SHIELD | Admitting: Family Medicine

## 2023-02-27 DIAGNOSIS — J9801 Acute bronchospasm: Secondary | ICD-10-CM

## 2023-02-27 DIAGNOSIS — L237 Allergic contact dermatitis due to plants, except food: Secondary | ICD-10-CM

## 2023-02-27 MED ORDER — ALBUTEROL SULFATE HFA 108 (90 BASE) MCG/ACT IN AERS
2.0000 | INHALATION_SPRAY | Freq: Four times a day (QID) | RESPIRATORY_TRACT | 0 refills | Status: DC | PRN
Start: 2023-02-27 — End: 2023-05-09

## 2023-02-27 MED ORDER — PREDNISONE 10 MG PO TABS
ORAL_TABLET | ORAL | 0 refills | Status: DC
Start: 2023-02-27 — End: 2023-05-09

## 2023-02-27 NOTE — Progress Notes (Signed)
Virtual Visit Consent   [name] [name], you are scheduled for a virtual visit with a [facility] provider today. Just as with appointments in the office, your consent must be obtained to participate. Your consent will be active for this visit and any virtual visit you may have with one of our providers in the next 365 days. If you have a MyChart account, a copy of this consent can be sent to you electronically.  As this is a virtual visit, video technology does not allow for your provider to perform a traditional examination. This may limit your provider's ability to fully assess your condition. If your provider identifies any concerns that need to be evaluated in person or the need to arrange testing (such as labs, EKG, etc.), we will make arrangements to do so. Although advances in technology are sophisticated, we cannot ensure that it will always work on either your end or our end. If the connection with a video visit is poor, the visit may have to be switched to a telephone visit. With either a video or telephone visit, we are not always able to ensure that we have a secure connection.  By engaging in this virtual visit, you consent to the provision of healthcare and authorize for your insurance to be billed (if applicable) for the services provided during this visit. Depending on your insurance coverage, you may receive a charge related to this service.  I need to obtain your verbal consent now. Are you willing to proceed with your visit today? [name] [name] has provided verbal consent on 02/27/2023 for a virtual visit (video or telephone). Freddy Finner, NP  Date: 02/27/2023 2:13 PM  Virtual Visit via Video Note   I, Freddy Finner, connected with  [name] [name]  ([id number], 1975-10-24) on 02/27/23 at  2:15 PM EST by a video-enabled telemedicine application and verified that I am speaking with the correct person using two identifiers.  Location: Patient: Virtual Visit Location Patient:  Home Provider: Virtual Visit Location Provider: Home Office   I discussed the limitations of evaluation and management by telemedicine and the availability of in person appointments. The patient expressed understanding and agreed to proceed.    History of Present Illness: [name] [name] is a 47 y.o. who identifies as a female who was assigned female at birth, and is being seen today for poison ivy  Was working in the yard, cutting down some shrubs-holly bushes she did not note them to be present, and ended up burning the shrubs. And then she started having some hives over night and has worse over night. Has a known allergy to this plant.   Is taking Pepcid and allegra nightly    Problems:  Patient Active Problem List   Diagnosis Date Noted   Vitamin B6 deficiency 08/29/2022   Medication monitoring encounter 02/23/2022   Maternal age 13+, multigravida, antepartum 02/22/2022   TB lung, latent 01/25/2022   Hemochromatosis, 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/2019   Frequent UTI 11/08/2017   Prothrombin gene mutation (HCC) 10/29/2017   Bilateral pulmonary embolism (HCC) 10/17/2017   Thyroid disease 10/17/2017   Depression with anxiety 10/17/2017   Heart murmur 10/17/2017   Heart palpitations 10/05/2016   Second degree atrioventricular block, Mobitz (type) I    IBS 07/19/2007   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 (Patient not taking: Reported on 08/29/2022), Disp: 1 tablet, Rfl: 4   Galcanezumab-gnlm (EMGALITY) 120 MG/ML SOAJ, Inject 120 mg into the skin every 28  (twenty-eight) days., Disp: 1.12 mL, Rfl: 11   hydroxychloroquine (PLAQUENIL) 200 MG tablet, Take 200 mg by mouth daily., Disp: , Rfl:    isoniazid (NYDRAZID) 300 MG tablet, Take 1 tablet (300 mg total) by mouth daily., Disp: 30 tablet, Rfl: 8   mupirocin ointment (BACTROBAN) 2 %, Place 1 Application into the nose 2 (two) times daily. For 5 days, Disp: 30 g, Rfl: 3   norethindrone (MICRONOR) 0.35 MG tablet, Take 1 tablet by mouth daily., Disp: , Rfl:    pyridOXINE (B-6) 50 MG tablet, Take 1 tablet (50 mg total) by mouth daily., Disp: 30 tablet, Rfl: 8   SUMAtriptan (IMITREX) 50 MG tablet, TAKE 1 TABLET (50 MG TOTAL) BY MOUTH AS NEEDED FOR MIGRAINE. MAY REPEAT IN 2 HOURS IF HEADACHE PERSISTS OR RECURS. MAXIMUM 2 TABLETS IN 24 HOURS., Disp: 10 tablet, Rfl: 5   VYVANSE 70 MG capsule, Take 70 mg by mouth daily., Disp: , Rfl:   Observations/Objective: Patient is well-developed, well-nourished in no acute distress.  Resting comfortably  at home.  Head is normocephalic, atraumatic.  No labored breathing.  Speech is clear and coherent with logical content.  Patient is alert and oriented at baseline.  Several noted hives on arms  Noted itching during the video  Assessment and Plan:  1. Poison ivy dermatitis  - predniSONE (DELTASONE) 10 MG tablet; Days 1-4 take 4 tablets (40 mg) daily  Days 5-8 take 3 tablets (30 mg) daily, Days 9-11 take 2 tablets (20 mg) daily, Days 12-14 take 1 tablet (10 mg) daily.  Dispense: 37 tablet; Refill: 0  2. Cough due to bronchospasm  - albuterol (VENTOLIN HFA) 108 (90 Base) MCG/ACT inhaler; Inhale 2 puffs into the lungs every 6 (six) hours as needed for wheezing or shortness of breath.  Dispense: 8 g; Refill: 0  -keep area clean and dry -avoid itching -inhaler if coughing starts (due to breathing it in) -follow up in 3-4 days if hives still occurring and or worsening conditions. -continue all medications as ordered    Reviewed side effects, risks and benefits  of medication.    Patient acknowledged agreement and understanding of the plan.   Past Medical, Surgical, Social History, Allergies, and Medications have been Reviewed.    Follow Up Instructions: I discussed the assessment and treatment plan with the patient. The patient was provided an opportunity to ask questions and all were answered. The patient agreed with the plan and demonstrated an understanding of the instructions.  A copy of instructions were sent to the patient via MyChart unless otherwise noted below.     The patient was advised to call back or seek an in-person evaluation if the symptoms worsen or if the condition fails to improve as anticipated.    Freddy Finner, NP

## 2023-02-27 NOTE — Patient Instructions (Addendum)
[name] [name], thank you for joining Freddy Finner, NP for today's virtual visit.  While this provider is not your primary care provider (PCP), if your PCP is located in our provider database this encounter information will be shared with them immediately following your visit.   A [facility] MyChart account gives you access to today's visit and all your visits, tests, and labs performed at [facility] " click here if you don't have a [facility] MyChart account or go to [email or web address]  Consent: (Patient) [name] [name] provided verbal consent for this virtual visit at the beginning of the encounter.  Current Medications:  Current Outpatient Medications:    albuterol (VENTOLIN HFA) 108 (90 Base) MCG/ACT inhaler, Inhale 2 puffs into the lungs every 6 (six) hours as needed for wheezing or shortness of breath., Disp: 8 g, Rfl: 0   predniSONE (DELTASONE) 10 MG tablet, Days 1-4 take 4 tablets (40 mg) daily  Days 5-8 take 3 tablets (30 mg) daily, Days 9-11 take 2 tablets (20 mg) daily, Days 12-14 take 1 tablet (10 mg) daily., Disp: 37 tablet, Rfl: 0   fluconazole (DIFLUCAN) 150 MG tablet, Take 1 tablet (150 mg total) by mouth daily. (Patient not taking: Reported on 08/29/2022), Disp: 1 tablet, Rfl: 4   Galcanezumab-gnlm (EMGALITY) 120 MG/ML SOAJ, Inject 120 mg into the skin every 28 (twenty-eight) days., Disp: 1.12 mL, Rfl: 11   hydroxychloroquine (PLAQUENIL) 200 MG tablet, Take 200 mg by mouth daily., Disp: , Rfl:    isoniazid (NYDRAZID) 300 MG tablet, Take 1 tablet (300 mg total) by mouth daily., Disp: 30 tablet, Rfl: 8   mupirocin ointment (BACTROBAN) 2 %, Place 1 Application into the nose 2 (two) times daily. For 5 days, Disp: 30 g, Rfl: 3   norethindrone (MICRONOR) 0.35 MG tablet, Take 1 tablet by mouth daily., Disp: , Rfl:    pyridOXINE (B-6) 50 MG tablet, Take 1 tablet (50 mg total) by mouth daily., Disp: 30 tablet, Rfl: 8   SUMAtriptan (IMITREX) 50 MG tablet, TAKE 1 TABLET  (50 MG TOTAL) BY MOUTH AS NEEDED FOR MIGRAINE. MAY REPEAT IN 2 HOURS IF HEADACHE PERSISTS OR RECURS. MAXIMUM 2 TABLETS IN 24 HOURS., Disp: 10 tablet, Rfl: 5   VYVANSE 70 MG capsule, Take 70 mg by mouth daily., Disp: , Rfl:    Medications ordered in this encounter:  Meds ordered this encounter  Medications   predniSONE (DELTASONE) 10 MG tablet    Sig: Days 1-4 take 4 tablets (40 mg) daily  Days 5-8 take 3 tablets (30 mg) daily, Days 9-11 take 2 tablets (20 mg) daily, Days 12-14 take 1 tablet (10 mg) daily.    Dispense:  37 tablet    Refill:  0    Order Specific Question:   Supervising Provider    Answer:   Loreli Dollar   albuterol (VENTOLIN HFA) 108 (90 Base) MCG/ACT inhaler    Sig: Inhale 2 puffs into the lungs every 6 (six) hours as needed for wheezing or shortness of breath.    Dispense:  8 g    Refill:  0    Order Specific Question:   Supervising Provider    Answer:   Merrilee Jansky [id number]     *If you need refills on other medications prior to your next appointment, please contact your pharmacy*  Follow-Up: Call back or seek an in-person evaluation if the symptoms worsen or if the condition fails to improve as anticipated.  [facility] [phone]  Other Instructions -keep area clean and dry -avoid itching -inhaler if coughing starts (due to breathing it in) -follow up in 3-4 days if hives still occurring and or worsening conditions. -continue all medications as ordered     If you have been instructed to have an in-person evaluation today at a local Urgent Care facility, please use the link below. It will take you to a list of all of our available [facility], including address, phone number and hours of operation. Please do not delay care.  [facility]  If you or a family member do not have a primary care provider, use the link below to schedule a visit and establish care. When you choose a [facility] primary  care physician or advanced practice provider, you gain a long-term partner in health. Find a Primary Care Provider  Learn more about [facility]'s in-office and virtual care options: [facility] - Get Care Now

## 2023-02-28 ENCOUNTER — Inpatient Hospital Stay: Payer: BLUE CROSS/BLUE SHIELD | Admitting: Medical Oncology

## 2023-02-28 ENCOUNTER — Encounter: Payer: Self-pay | Admitting: Medical Oncology

## 2023-02-28 ENCOUNTER — Inpatient Hospital Stay: Payer: BLUE CROSS/BLUE SHIELD | Attending: Hematology & Oncology

## 2023-02-28 DIAGNOSIS — D6852 Prothrombin gene mutation: Secondary | ICD-10-CM

## 2023-02-28 DIAGNOSIS — I2699 Other pulmonary embolism without acute cor pulmonale: Secondary | ICD-10-CM

## 2023-02-28 LAB — CBC WITH DIFFERENTIAL (CANCER CENTER ONLY)
Abs Immature Granulocytes: 0.02 10*3/uL (ref 0.00–0.07)
Basophils Absolute: 0 10*3/uL (ref 0.0–0.1)
Basophils Relative: 0 %
Eosinophils Absolute: 0.1 10*3/uL (ref 0.0–0.5)
Eosinophils Relative: 1 %
HCT: 39.5 % (ref 36.0–46.0)
Hemoglobin: 13.2 g/dL (ref 12.0–15.0)
Immature Granulocytes: 0 %
Lymphocytes Relative: 26 %
Lymphs Abs: 1.3 10*3/uL (ref 0.7–4.0)
MCH: 33.6 pg (ref 26.0–34.0)
MCHC: 33.4 g/dL (ref 30.0–36.0)
MCV: 100.5 fL — ABNORMAL HIGH (ref 80.0–100.0)
Monocytes Absolute: 0.3 10*3/uL (ref 0.1–1.0)
Monocytes Relative: 6 %
Neutro Abs: 3.2 10*3/uL (ref 1.7–7.7)
Neutrophils Relative %: 67 %
Platelet Count: 314 10*3/uL (ref 150–400)
RBC: 3.93 MIL/uL (ref 3.87–5.11)
RDW: 11.8 % (ref 11.5–15.5)
WBC Count: 4.8 10*3/uL (ref 4.0–10.5)
nRBC: 0 % (ref 0.0–0.2)

## 2023-02-28 LAB — CMP (CANCER CENTER ONLY)
ALT: 14 U/L (ref 0–44)
AST: 20 U/L (ref 15–41)
Albumin: 4.8 g/dL (ref 3.5–5.0)
Alkaline Phosphatase: 52 U/L (ref 38–126)
Anion gap: 10 (ref 5–15)
BUN: 14 mg/dL (ref 6–20)
CO2: 28 mmol/L (ref 22–32)
Calcium: 10.1 mg/dL (ref 8.9–10.3)
Chloride: 106 mmol/L (ref 98–111)
Creatinine: 0.94 mg/dL (ref 0.44–1.00)
GFR, Estimated: >60 mL/min (ref >60)
Glucose, Bld: 103 mg/dL — ABNORMAL HIGH (ref 70–99)
Potassium: 4.1 mmol/L (ref 3.5–5.1)
Sodium: 144 mmol/L (ref 135–145)
Total Bilirubin: 0.5 mg/dL (ref <1.2)
Total Protein: 7.7 g/dL (ref 6.5–8.1)

## 2023-02-28 LAB — IRON AND IRON BINDING CAPACITY (CC-WL,HP ONLY)
Iron: 99 ug/dL (ref 28–170)
Saturation Ratios: 28 % (ref 10.4–31.8)
TIBC: 356 ug/dL (ref 250–450)
UIBC: 257 ug/dL (ref 148–442)

## 2023-02-28 LAB — FERRITIN: Ferritin: 119 ng/mL (ref 11–307)

## 2023-02-28 NOTE — Progress Notes (Signed)
Hematology and Oncology Follow Up Visit  [name] [name] [id number] 12-23-1975 47 y.o. 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  She reports that she has been doing well.  She has been very active.  No fever, chills, n/v, cough, rash, dizziness, SOB, chest pain, palpitations, abdominal pain or changes in bowel or bladder habits.  No swelling in her extremities at this time.  Occasional tingling in her fingertips.  No falls or syncope reported.  Appetite and hydration are good.    ECOG Performance Status: 1 - Symptomatic but completely ambulatory  Medications:  Allergies as of 02/28/2023       Reactions   Sulfa Antibiotics Anaphylaxis, Hives   Morphine Other (See Comments)   aggressiveness   Morphine And Codeine Itching, Other (See Comments)   hallucinations   Erythromycin Other (See Comments)   Unknown reaction from childhood        Medication List        Accurate as of February 28, 2023  9:18 AM. If you have any questions, ask your nurse or doctor.          albuterol 108 (90 Base) MCG/ACT inhaler Commonly known as: VENTOLIN HFA Inhale 2 puffs into the lungs every 6 (six) hours as needed for wheezing or shortness of breath.   Emgality 120 MG/ML Soaj Generic drug: Galcanezumab-gnlm Inject 120 mg into the skin every 28 (twenty-eight) days.   fluconazole 150 MG tablet Commonly known as: Diflucan Take 1 tablet (150 mg total) by mouth daily.   hydroxychloroquine 200 MG tablet Commonly known as: PLAQUENIL Take 200 mg by mouth daily.   isoniazid 300 MG tablet Commonly known as:  NYDRAZID Take 1 tablet (300 mg total) by mouth daily.   mupirocin ointment 2 % Commonly known as: BACTROBAN Place 1 Application into the nose 2 (two) times daily. For 5 days   norethindrone 0.35 MG tablet Commonly known as: MICRONOR Take 1 tablet by mouth daily.   predniSONE 10 MG tablet Commonly known as: DELTASONE Days 1-4 take 4 tablets (40 mg) daily  Days 5-8 take 3 tablets (30 mg) daily, Days 9-11 take 2 tablets (20 mg) daily, Days 12-14 take 1 tablet (10 mg) daily.   pyridOXINE 50 MG tablet Commonly known as: B-6 Take 1 tablet (50 mg total) by mouth daily.   SUMAtriptan 50 MG tablet Commonly known as: IMITREX TAKE 1 TABLET (50 MG TOTAL) BY MOUTH AS NEEDED FOR MIGRAINE. MAY REPEAT IN 2 HOURS IF HEADACHE PERSISTS OR RECURS. MAXIMUM 2 TABLETS IN 24 HOURS.   Vyvanse 70 MG capsule Generic drug: lisdexamfetamine Take 70 mg by mouth daily.        Allergies:  Allergies  Allergen Reactions   Sulfa Antibiotics Anaphylaxis and Hives   Morphine Other (See Comments)    aggressiveness   Morphine And Codeine Itching and Other (See Comments)    hallucinations   Erythromycin Other (See Comments)    Unknown reaction from childhood    Past Medical History, Surgical history, Social history, and Family History were reviewed and updated.  Review of Systems: All  other 10 point review of systems is negative.   Physical Exam:  weight is 123 lb 6.4 oz (56 kg). Her oral temperature is 98 F (36.7 C). Her blood pressure is 123/90 (abnormal) and her pulse is 96. Her respiration is 17 and oxygen saturation is 100%.   Wt Readings from Last 3 Encounters:  02/28/23 123 lb 6.4 oz (56 kg)  08/29/22 123 lb (55.8 kg)  08/04/22 122 lb (55.3 kg)    Ocular: Sclerae unicteric, pupils equal, round and reactive to light Ear-nose-throat: Oropharynx clear, dentition fair Lymphatic: No cervical or supraclavicular adenopathy Lungs no rales or rhonchi, good excursion bilaterally Heart regular  rate and rhythm, no murmur appreciated Abd soft, nontender, positive bowel sounds MSK no focal spinal tenderness, no joint edema Neuro: non-focal, well-oriented, appropriate affect   Lab Results  Component Value Date   WBC 4.8 02/28/2023   HGB 13.2 02/28/2023   HCT 39.5 02/28/2023   MCV 100.5 (H) 02/28/2023   PLT 314 02/28/2023   Lab Results  Component Value Date   FERRITIN 109 08/04/2022   IRON 190 (H) 08/04/2022   TIBC 386 08/04/2022   UIBC 196 08/04/2022   IRONPCTSAT 49 (H) 08/04/2022   Lab Results  Component Value Date   RETICCTPCT 1.9 01/13/2019   RBC 3.93 02/28/2023   No results found for: "KPAFRELGTCHN", "LAMBDASER", "KAPLAMBRATIO" Lab Results  Component Value Date   IGA 264 12/19/2006   No results found for: "TOTALPROTELP", "ALBUMINELP", "A1GS", "A2GS", "BETS", "BETA2SER", "GAMS", "MSPIKE", "SPEI"   Chemistry      Component Value Date/Time   NA 144 02/28/2023 0833   K 4.1 02/28/2023 0833   CL 106 02/28/2023 0833   CO2 28 02/28/2023 0833   BUN 14 02/28/2023 0833   CREATININE 0.94 02/28/2023 0833   CREATININE 0.86 10/16/2017 1454      Component Value Date/Time   CALCIUM 10.1 02/28/2023 0833   ALKPHOS 52 02/28/2023 0833   AST 20 02/28/2023 0833   ALT 14 02/28/2023 0833   BILITOT 0.5 02/28/2023 5956     Encounter Diagnoses  Name Primary?   Hemochromatosis, hereditary (HCC) Yes   Bilateral pulmonary embolism (HCC)    Prothrombin gene mutation (HCC)     Impression and Plan: [name] [name] is a very pleasant 47 yo caucasian female heterozygous for the Prothrombin II gene mutation with bilateral pulmonary emboli. She also has hemochromatosis, heterozygote for H63D.   CBC and CMP reviewed today  Iron studies are pending. We will set her up for phlebotomy if needed.  RTC APP, labs (CBC, CMP, iron, ferritin)-[facility]   Rushie Chestnut, PA-C 11/6/20249:18 AM

## 2023-03-01 ENCOUNTER — Encounter: Payer: Self-pay | Admitting: Family

## 2023-05-09 ENCOUNTER — Inpatient Hospital Stay: Payer: BLUE CROSS/BLUE SHIELD

## 2023-05-09 ENCOUNTER — Inpatient Hospital Stay: Payer: BLUE CROSS/BLUE SHIELD | Admitting: Medical Oncology

## 2023-05-09 ENCOUNTER — Inpatient Hospital Stay: Payer: BLUE CROSS/BLUE SHIELD | Attending: Family

## 2023-05-09 DIAGNOSIS — D6852 Prothrombin gene mutation: Secondary | ICD-10-CM | POA: Diagnosis present

## 2023-05-09 DIAGNOSIS — Z7901 Long term (current) use of anticoagulants: Secondary | ICD-10-CM | POA: Insufficient documentation

## 2023-05-09 DIAGNOSIS — I2699 Other pulmonary embolism without acute cor pulmonale: Secondary | ICD-10-CM

## 2023-05-09 LAB — IRON AND IRON BINDING CAPACITY (CC-WL,HP ONLY)
Iron: 101 ug/dL (ref 28–170)
Saturation Ratios: 29 % (ref 10.4–31.8)
TIBC: 351 ug/dL (ref 250–450)
UIBC: 250 ug/dL (ref 148–442)

## 2023-05-09 LAB — CMP (CANCER CENTER ONLY)
ALT: 19 U/L (ref 0–44)
AST: 25 U/L (ref 15–41)
Albumin: 4.7 g/dL (ref 3.5–5.0)
Alkaline Phosphatase: 52 U/L (ref 38–126)
Anion gap: 9 (ref 5–15)
BUN: 17 mg/dL (ref 6–20)
CO2: 28 mmol/L (ref 22–32)
Calcium: 9.9 mg/dL (ref 8.9–10.3)
Chloride: 103 mmol/L (ref 98–111)
Creatinine: 0.85 mg/dL (ref 0.44–1.00)
GFR, Estimated: >60 mL/min (ref >60)
Glucose, Bld: 145 mg/dL — ABNORMAL HIGH (ref 70–99)
Potassium: 4.2 mmol/L (ref 3.5–5.1)
Sodium: 140 mmol/L (ref 135–145)
Total Bilirubin: 0.6 mg/dL (ref 0.0–1.2)
Total Protein: 7.6 g/dL (ref 6.5–8.1)

## 2023-05-09 LAB — CBC
HCT: 40.1 % (ref 36.0–46.0)
Hemoglobin: 13.5 g/dL (ref 12.0–15.0)
MCH: 33.6 pg (ref 26.0–34.0)
MCHC: 33.7 g/dL (ref 30.0–36.0)
MCV: 99.8 fL (ref 80.0–100.0)
Platelets: 350 10*3/uL (ref 150–400)
RBC: 4.02 MIL/uL (ref 3.87–5.11)
RDW: 11.8 % (ref 11.5–15.5)
WBC: 4.5 10*3/uL (ref 4.0–10.5)
nRBC: 0 % (ref 0.0–0.2)

## 2023-05-09 LAB — FERRITIN: Ferritin: 135 ng/mL (ref 11–307)

## 2023-05-09 NOTE — Progress Notes (Signed)
 Hematology and Oncology Follow Up Visit  [name] [name] [id number] Jul 19, 1975 47 y.o. 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  She reports that she has been doing well other than having trouble sleeping. She can get to sleep initially but wakes up in the middle of the night with her "brain racing".  She has been very active.  No fever, chills, n/v, cough, rash, dizziness, SOB, chest pain, palpitations, abdominal pain or changes in bowel or bladder habits.  No swelling in her extremities at this time.  Occasional tingling in her fingertips.  No falls or syncope reported.  Appetite and hydration are good.    ECOG Performance Status: 1 - Symptomatic but completely ambulatory  Medications:  Allergies as of 05/09/2023       Reactions   Sulfa Antibiotics Anaphylaxis, Hives   Morphine  Other (See Comments)   aggressiveness   Morphine  And Codeine Itching, Other (See Comments)   hallucinations   Erythromycin Other (See Comments)   Unknown reaction from childhood        Medication List        Accurate as of May 09, 2023  9:37 AM. If you have any questions, ask your nurse or doctor.          STOP taking these medications    albuterol  108 (90 Base) MCG/ACT inhaler Commonly known as: VENTOLIN  HFA Stopped by: Sharla Davis   isoniazid  300 MG tablet Commonly known as: NYDRAZID  Stopped by: Sharla Davis   predniSONE  10 MG tablet Commonly known as: DELTASONE  Stopped by: Sharla Davis       TAKE these medications    Emgality  120 MG/ML Soaj Generic drug:  Galcanezumab -gnlm Inject 120 mg into the skin every 28 (twenty-eight) days.   fluconazole  150 MG tablet Commonly known as: Diflucan  Take 1 tablet (150 mg total) by mouth daily.   hydroxychloroquine 200 MG tablet Commonly known as: PLAQUENIL Take 200 mg by mouth daily.   mupirocin  ointment 2 % Commonly known as: BACTROBAN  Place 1 Application into the nose 2 (two) times daily. For 5 days   norethindrone 0.35 MG tablet Commonly known as: MICRONOR Take 1 tablet by mouth daily.   pyridOXINE  50 MG tablet Commonly known as: B-6 Take 1 tablet (50 mg total) by mouth daily.   SUMAtriptan  50 MG tablet Commonly known as: IMITREX  TAKE 1 TABLET (50 MG TOTAL) BY MOUTH AS NEEDED FOR MIGRAINE. MAY REPEAT IN 2 HOURS IF HEADACHE PERSISTS OR RECURS. MAXIMUM 2 TABLETS IN 24 HOURS.   Vyvanse 70 MG capsule Generic drug: lisdexamfetamine Take 70 mg by mouth daily.        Allergies:  Allergies  Allergen Reactions   Sulfa Antibiotics Anaphylaxis and Hives   Morphine  Other (See Comments)    aggressiveness   Morphine  And Codeine Itching and Other (See Comments)    hallucinations   Erythromycin Other (See Comments)    Unknown reaction from childhood    Past Medical History, Surgical history, Social history, and Family History were reviewed and updated.  Review of Systems: All other 10 point review  of systems is negative.   Physical Exam:  weight is 125 lb (56.7 kg). Her oral temperature is 98.6 F (37 C). Her blood pressure is 135/77 and her pulse is 100. Her respiration is 17 and oxygen saturation is 100%.   Wt Readings from Last 3 Encounters:  05/09/23 125 lb (56.7 kg)  02/28/23 123 lb 6.4 oz (56 kg)  08/29/22 123 lb (55.8 kg)    Ocular: Sclerae unicteric, pupils equal, round and reactive to light Ear-nose-throat: Oropharynx clear, dentition fair Lymphatic: No cervical or supraclavicular adenopathy Lungs no rales or rhonchi, good excursion bilaterally Heart regular rate and  rhythm, no murmur appreciated Abd soft, nontender, positive bowel sounds MSK no focal spinal tenderness, no joint edema Neuro: non-focal, well-oriented, appropriate affect   Lab Results  Component Value Date   WBC 4.5 05/09/2023   HGB 13.5 05/09/2023   HCT 40.1 05/09/2023   MCV 99.8 05/09/2023   PLT 350 05/09/2023   Lab Results  Component Value Date   FERRITIN 119 02/28/2023   IRON 99 02/28/2023   TIBC 356 02/28/2023   UIBC 257 02/28/2023   IRONPCTSAT 28 02/28/2023   Lab Results  Component Value Date   RETICCTPCT 1.9 01/13/2019   RBC 4.02 05/09/2023   No results found for: "KPAFRELGTCHN", "LAMBDASER", "KAPLAMBRATIO" Lab Results  Component Value Date   IGA 264 12/19/2006   No results found for: "TOTALPROTELP", "ALBUMINELP", "A1GS", "A2GS", "BETS", "BETA2SER", "GAMS", "MSPIKE", "SPEI"   Chemistry      Component Value Date/Time   NA 144 02/28/2023 0833   K 4.1 02/28/2023 0833   CL 106 02/28/2023 0833   CO2 28 02/28/2023 0833   BUN 14 02/28/2023 0833   CREATININE 0.94 02/28/2023 0833   CREATININE 0.86 10/16/2017 1454      Component Value Date/Time   CALCIUM 10.1 02/28/2023 0833   ALKPHOS 52 02/28/2023 0833   AST 20 02/28/2023 0833   ALT 14 02/28/2023 0833   BILITOT 0.5 02/28/2023 4098     Encounter Diagnoses  Name Primary?   Hemochromatosis, hereditary (HCC) Yes   Bilateral pulmonary embolism (HCC)    Prothrombin gene mutation (HCC)     Impression and Plan: [name] [name] is a very pleasant 48 yo caucasian female heterozygous for the Prothrombin II gene mutation with bilateral pulmonary emboli. She also has hemochromatosis, heterozygote for H63D.   She has an appointment with her psychiatrist tomorrow and will discuss her sleep. She may do well with something like Trazodone  CBC and CMP reviewed today and appear stable.  Hgb 13.5.  Iron studies are pending. We will set her up for phlebotomy if needed.  RTC 2 months APP, labs (CBC, CMP, iron, ferritin)-[place]    Sharla Davis, PA-C 1/15/20259:37 AM

## 2023-05-10 ENCOUNTER — Encounter: Payer: Self-pay | Admitting: Medical Oncology

## 2023-05-15 ENCOUNTER — Inpatient Hospital Stay: Payer: BLUE CROSS/BLUE SHIELD

## 2023-05-15 MED ORDER — SODIUM CHLORIDE 0.9 % IV SOLN: Freq: Once | INTRAVENOUS | Status: AC

## 2023-05-15 NOTE — Patient Instructions (Signed)
 [facility] [place] - A DEPT OF [facility]  Discharge Instructions: Thank you for choosing [facility] to provide your oncology and hematology care.  If you have a lab appointment with the Cancer Center - please note that after April 8th, 2024, all labs will be drawn in the cancer center.  You do not have to check in or register with the main entrance as you have in the past but will complete your check-in in the cancer center.  Wear comfortable clothing and clothing appropriate for easy access to any Portacath or PICC line.   We strive to give you quality time with your provider. You may need to reschedule your appointment if you arrive late (15 or more minutes).  Arriving late affects you and other patients whose appointments are after yours.  Also, if you miss three or more appointments without notifying the office, you may be dismissed from the clinic at the provider's discretion.      For prescription refill requests, have your pharmacy contact our office and allow 72 hours for refills to be completed.    Today you received the following chemotherapy and/or immunotherapy agents phlebotomy. Therapeutic Phlebotomy Therapeutic phlebotomy is the planned removal of blood from a person's body for the purpose of treating a medical condition. The procedure is lot like donating blood. Usually, about a pint (470 mL, or 0.47 L) of blood is removed. The average adult has 9-12 pints (4.3-5.7 L) of blood in his or her body. Therapeutic phlebotomy may be used to treat the following medical conditions: Hemochromatosis. This is a condition in which the blood contains too much iron. Polycythemia vera. This is a condition in which the blood contains too many red blood cells. Porphyria cutanea tarda. This is a disease in which an important part of hemoglobin is not made properly. It results in the buildup of abnormal amounts of porphyrins in the body. Sickle cell disease.  This is a condition in which the red blood cells form an abnormal crescent shape rather than a round shape. Tell a health care provider about: Any allergies you have. All medicines you are taking, including vitamins, herbs, eye drops, creams, and over-the-counter medicines. Any bleeding problems you have. Any surgeries you have had. Any medical conditions you have. Whether you are pregnant or may be pregnant. What are the risks? Generally, this is a safe procedure. However, problems may occur, including: Nausea or light-headedness. Low blood pressure (hypotension). Soreness, bleeding, swelling, or bruising at the needle insertion site. Infection. What happens before the procedure? Ask your health care provider about: Changing or stopping your regular medicines. This is especially important if you are taking diabetes medicines or blood thinners. Taking medicines such as aspirin and ibuprofen. These medicines can thin your blood. Do not take these medicines unless your health care provider tells you to take them. Taking over-the-counter medicines, vitamins, herbs, and supplements. Wear clothing with sleeves that can be raised above the elbow. You may have a blood sample taken. Your blood pressure, pulse rate, and breathing rate will be measured. What happens during the procedure?  You may be given a medicine to numb the area (local anesthetic). A tourniquet will be placed on your arm. A needle will be put into one of your veins. Tubing and a collection bag will be attached to the needle. Blood will flow through the needle and tubing into the collection bag. The collection bag will be placed lower than your  arm so gravity can help the blood flow into the bag. You may be asked to open and close your hand slowly and continually during the entire collection. After the specified amount of blood has been removed from your body, the collection bag and tubing will be clamped. The needle will be  removed from your vein. Pressure will be held on the needle site to stop the bleeding. A bandage (dressing) will be placed over the needle insertion site. The procedure may vary among health care providers and hospitals. What happens after the procedure? Your blood pressure, pulse rate, and breathing rate will be measured after the procedure. You will be encouraged to drink fluids. You will be encouraged to eat a snack to prevent a low blood sugar level. Your recovery will be assessed and monitored. Return to your normal activities as told by your health care provider. Summary Therapeutic phlebotomy is the planned removal of blood from a person's body for the purpose of treating a medical condition. Therapeutic phlebotomy may be used to treat hemochromatosis, polycythemia vera, porphyria cutanea tarda, or sickle cell disease. In the procedure, a needle is inserted and about a pint (470 mL, or 0.47 L) of blood is removed. The average adult has 9-12 pints (4.3-5.7 L) of blood in the body. This is generally a safe procedure, but it can sometimes cause problems such as nausea, light-headedness, or low blood pressure (hypotension). This information is not intended to replace advice given to you by your health care provider. Make sure you discuss any questions you have with your health care provider. Document Revised: 10/06/2020 Document Reviewed: 10/06/2020 Elsevier Patient Education  2024 Elsevier Inc.       To help prevent nausea and vomiting after your treatment, we encourage you to take your nausea medication as directed.  BELOW ARE SYMPTOMS THAT SHOULD BE REPORTED IMMEDIATELY: *FEVER GREATER THAN 100.4 F (38 C) OR HIGHER *CHILLS OR SWEATING *NAUSEA AND VOMITING THAT IS NOT CONTROLLED WITH YOUR NAUSEA MEDICATION *UNUSUAL SHORTNESS OF BREATH *UNUSUAL BRUISING OR BLEEDING *URINARY PROBLEMS (pain or burning when urinating, or frequent urination) *BOWEL PROBLEMS (unusual diarrhea,  constipation, pain near the anus) TENDERNESS IN MOUTH AND THROAT WITH OR WITHOUT PRESENCE OF ULCERS (sore throat, sores in mouth, or a toothache) UNUSUAL RASH, SWELLING OR PAIN  UNUSUAL VAGINAL DISCHARGE OR ITCHING   Items with * indicate a potential emergency and should be followed up as soon as possible or go to the Emergency Department if any problems should occur.  Please show the CHEMOTHERAPY ALERT CARD or IMMUNOTHERAPY ALERT CARD at check-in to the Emergency Department and triage nurse.  Should you have questions after your visit or need to cancel or reschedule your appointment, please contact [facility]  - A DEPT OF [facility] [phone]  and follow the prompts.  Office hours are 8:00 a.m. to 4:30 p.m. Monday - Friday. Please note that voicemails left after 4:00 p.m. may not be returned until the following business day.  We are closed weekends and major holidays. You have access to a nurse at all times for urgent questions. Please call the main number to the clinic [phone] and follow the prompts.  For any non-urgent questions, you may also contact your provider using MyChart. We now offer e-Visits for anyone 64 and older to request care online for non-urgent symptoms. For details visit [email or web address].   Also download the MyChart app! Go to the app store, search "MyChart", open the app, select [facility], and log in with your MyChart username and password.

## 2023-05-15 NOTE — Progress Notes (Signed)
Phlebotomy given today per MD orders. Tolerated infusion without adverse affects. 500 ml bolus of normal saline given post phlebotomy. Vital signs stable. No complaints at this time. Discharged from clinic ambulatory in stable condition. Alert and oriented x 3. F/U with [facility] as scheduled.

## 2023-05-15 NOTE — Progress Notes (Signed)
[name] [name] presents today for phlebotomy per MD orders. Phlebotomy procedure started at 1414 and ended at 1421 500 cc removed. Patient tolerated procedure well with no complications. Fluids started per orders for patient to receive over 30 minutes. Vitals stable.  [name]

## 2023-05-22 ENCOUNTER — Other Ambulatory Visit (HOSPITAL_COMMUNITY): Payer: Self-pay | Admitting: Obstetrics and Gynecology

## 2023-05-22 DIAGNOSIS — Z1231 Encounter for screening mammogram for malignant neoplasm of breast: Secondary | ICD-10-CM

## 2023-06-27 ENCOUNTER — Encounter (HOSPITAL_COMMUNITY): Payer: Self-pay

## 2023-06-27 ENCOUNTER — Encounter: Payer: Self-pay | Admitting: Neurology

## 2023-06-27 ENCOUNTER — Encounter: Payer: Self-pay | Admitting: Family

## 2023-06-27 ENCOUNTER — Ambulatory Visit (HOSPITAL_COMMUNITY)
Admission: RE | Admit: 2023-06-27 | Discharge: 2023-06-27 | Disposition: A | Discharge status: Home / Self Care | Payer: BLUE CROSS/BLUE SHIELD | Source: Ambulatory Visit | Attending: Obstetrics and Gynecology | Admitting: Obstetrics and Gynecology

## 2023-06-27 ENCOUNTER — Other Ambulatory Visit (HOSPITAL_COMMUNITY): Payer: Self-pay | Admitting: Obstetrics and Gynecology

## 2023-06-27 DIAGNOSIS — Z1231 Encounter for screening mammogram for malignant neoplasm of breast: Secondary | ICD-10-CM | POA: Insufficient documentation

## 2023-07-25 ENCOUNTER — Ambulatory Visit: Payer: BLUE CROSS/BLUE SHIELD | Admitting: Medical Oncology

## 2023-07-25 ENCOUNTER — Other Ambulatory Visit: Payer: BLUE CROSS/BLUE SHIELD

## 2023-07-30 ENCOUNTER — Encounter: Payer: Self-pay | Admitting: Medical Oncology

## 2023-07-30 ENCOUNTER — Inpatient Hospital Stay (HOSPITAL_BASED_OUTPATIENT_CLINIC_OR_DEPARTMENT_OTHER): Payer: BLUE CROSS/BLUE SHIELD | Admitting: Medical Oncology

## 2023-07-30 ENCOUNTER — Other Ambulatory Visit: Payer: Self-pay

## 2023-07-30 ENCOUNTER — Inpatient Hospital Stay: Payer: BLUE CROSS/BLUE SHIELD | Attending: Family

## 2023-07-30 DIAGNOSIS — Z7901 Long term (current) use of anticoagulants: Secondary | ICD-10-CM | POA: Diagnosis not present

## 2023-07-30 DIAGNOSIS — D6852 Prothrombin gene mutation: Secondary | ICD-10-CM

## 2023-07-30 DIAGNOSIS — I2699 Other pulmonary embolism without acute cor pulmonale: Secondary | ICD-10-CM | POA: Insufficient documentation

## 2023-07-30 LAB — IRON AND IRON BINDING CAPACITY (CC-WL,HP ONLY)
Iron: 143 ug/dL (ref 28–170)
Saturation Ratios: 42 % — ABNORMAL HIGH (ref 10.4–31.8)
TIBC: 344 ug/dL (ref 250–450)
UIBC: 201 ug/dL (ref 148–442)

## 2023-07-30 LAB — FERRITIN: Ferritin: 99 ng/mL (ref 11–307)

## 2023-07-30 LAB — CMP (CANCER CENTER ONLY)
ALT: 25 U/L (ref 0–44)
AST: 32 U/L (ref 15–41)
Albumin: 4.5 g/dL (ref 3.5–5.0)
Alkaline Phosphatase: 58 U/L (ref 38–126)
Anion gap: 9 (ref 5–15)
BUN: 13 mg/dL (ref 6–20)
CO2: 27 mmol/L (ref 22–32)
Calcium: 9.5 mg/dL (ref 8.9–10.3)
Chloride: 105 mmol/L (ref 98–111)
Creatinine: 0.96 mg/dL (ref 0.44–1.00)
GFR, Estimated: >60 mL/min (ref >60)
Glucose, Bld: 129 mg/dL — ABNORMAL HIGH (ref 70–99)
Potassium: 4.1 mmol/L (ref 3.5–5.1)
Sodium: 141 mmol/L (ref 135–145)
Total Bilirubin: 0.7 mg/dL (ref 0.0–1.2)
Total Protein: 7.3 g/dL (ref 6.5–8.1)

## 2023-07-30 LAB — CBC
HCT: 39.4 % (ref 36.0–46.0)
Hemoglobin: 13.4 g/dL (ref 12.0–15.0)
MCH: 33.9 pg (ref 26.0–34.0)
MCHC: 34 g/dL (ref 30.0–36.0)
MCV: 99.7 fL (ref 80.0–100.0)
Platelets: 329 10*3/uL (ref 150–400)
RBC: 3.95 MIL/uL (ref 3.87–5.11)
RDW: 11.9 % (ref 11.5–15.5)
WBC: 4.2 10*3/uL (ref 4.0–10.5)
nRBC: 0 % (ref 0.0–0.2)

## 2023-07-30 NOTE — Progress Notes (Signed)
 Hematology and Oncology Follow Up Visit  [name] [name] [id number] 1975/10/05 48 y.o. 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   Today she states that she has been doing better since starting trazodone. Fatigue is better.  Still getting headaches from weather changes Her last phlebotomy was on 05/15/2023 No fever, chills, n/v, cough, rash, dizziness, SOB, chest pain, palpitations, abdominal pain or changes in bowel or bladder habits.  She does have a history of provoked DVT while on OCP with history of heterozygous prothrombin IIgene mutation. She finished 1 year of anticoagulation therapy in July 2020 and elected to stop her Eliquis. She has not had a recurrence of clotting event since.  No swelling in her extremities at this time.  Occasional tingling in her fingertips.  No falls or syncope reported.  Appetite and hydration are good.    Wt Readings from Last 3 Encounters:  07/30/23 120 lb (54.4 kg)  05/09/23 125 lb (56.7 kg)  02/28/23 123 lb 6.4 oz (56 kg)   ECOG Performance Status: 1 - Symptomatic but completely ambulatory  Medications:  Allergies as of 07/30/2023       Reactions   Sulfa Antibiotics Anaphylaxis, Hives   Morphine Other (See Comments)   aggressiveness   Morphine And Codeine Itching, Other (See Comments)   hallucinations   Erythromycin Other (See Comments)   Unknown reaction from childhood        Medication List        Accurate as of July 30, 2023  9:38 AM. If you have any questions, ask your nurse or doctor.          STOP taking these  medications    Emgality 120 MG/ML Soaj Generic drug: Galcanezumab-gnlm Stopped by: Rushie Chestnut   fluconazole 150 MG tablet Commonly known as: Diflucan Stopped by: Rushie Chestnut       TAKE these medications    hydroxychloroquine 200 MG tablet Commonly known as: PLAQUENIL Take 200 mg by mouth daily.   mupirocin ointment 2 % Commonly known as: BACTROBAN Place 1 Application into the nose 2 (two) times daily. For 5 days   norethindrone 0.35 MG tablet Commonly known as: MICRONOR Take 1 tablet by mouth daily.   pyridOXINE 50 MG tablet Commonly known as: B-6 Take 1 tablet (50 mg total) by mouth daily.   SUMAtriptan 50 MG tablet Commonly known as: IMITREX TAKE 1 TABLET (50 MG TOTAL) BY MOUTH AS NEEDED FOR MIGRAINE. MAY REPEAT IN 2 HOURS IF HEADACHE PERSISTS OR RECURS. MAXIMUM 2 TABLETS IN 24 HOURS.   Vyvanse 70 MG capsule Generic drug: lisdexamfetamine Take 70 mg by mouth daily.        Allergies:  Allergies  Allergen Reactions   Sulfa Antibiotics Anaphylaxis and Hives   Morphine Other (See Comments)    aggressiveness   Morphine And Codeine Itching and Other (See Comments)    hallucinations   Erythromycin Other (See Comments)    Unknown reaction from childhood    Past Medical History, Surgical history, Social history, and Family History were reviewed and updated.  Review of Systems: All other 10 point review of systems is negative.   Physical Exam:  weight is 120 lb (54.4 kg). Her oral temperature is 97.9 F (36.6 C). Her blood pressure is 115/70 and her pulse is 95. Her respiration is 19 and oxygen saturation is 100%.   Wt Readings from Last 3 Encounters:  07/30/23 120 lb (54.4 kg)  05/09/23 125 lb (56.7 kg)  02/28/23 123 lb 6.4 oz (56 kg)   Constitutional: Well appearing, AA x 3 Ocular: Sclerae unicteric, pupils equal, round and reactive to light Ear-nose-throat: Oropharynx clear, dentition fair Lymphatic: No cervical or supraclavicular  adenopathy Lungs no rales or rhonchi, good excursion bilaterally Heart regular rate and rhythm, no murmur appreciated Abd soft, nontender, positive bowel sounds MSK no focal spinal tenderness, no joint edema Neuro: non-focal, well-oriented, appropriate affect   Lab Results  Component Value Date   WBC 4.2 07/30/2023   HGB 13.4 07/30/2023   HCT 39.4 07/30/2023   MCV 99.7 07/30/2023   PLT 329 07/30/2023   Lab Results  Component Value Date   FERRITIN 135 05/09/2023   IRON 101 05/09/2023   TIBC 351 05/09/2023   UIBC 250 05/09/2023   IRONPCTSAT 29 05/09/2023   Lab Results  Component Value Date   RETICCTPCT 1.9 01/13/2019   RBC 3.95 07/30/2023   No results found for: "KPAFRELGTCHN", "LAMBDASER", "KAPLAMBRATIO" Lab Results  Component Value Date   IGA 264 12/19/2006   No results found for: "TOTALPROTELP", "ALBUMINELP", "A1GS", "A2GS", "BETS", "BETA2SER", "GAMS", "MSPIKE", "SPEI"   Chemistry      Component Value Date/Time   NA 140 05/09/2023 0909   K 4.2 05/09/2023 0909   CL 103 05/09/2023 0909   CO2 28 05/09/2023 0909   BUN 17 05/09/2023 0909   CREATININE 0.85 05/09/2023 0909   CREATININE 0.86 10/16/2017 1454      Component Value Date/Time   CALCIUM 9.9 05/09/2023 0909   ALKPHOS 52 05/09/2023 0909   AST 25 05/09/2023 0909   ALT 19 05/09/2023 0909   BILITOT 0.6 05/09/2023 2130     Encounter Diagnoses  Name Primary?   Hemochromatosis, hereditary (HCC) Yes   Prothrombin gene mutation (HCC)     Impression and Plan: [name] [name] is a very pleasant 48 yo caucasian female heterozygous for the Prothrombin II gene mutation with bilateral pulmonary emboli. She also has hemochromatosis, heterozygote for H63D. Current management for both include therapeutic Phlebotomy to keep Ferritin < 100 and %Sat < 30.   CBC and CMP reviewed today and appear stable.  Hgb 13.4  Iron studies are pending. We will set her up for phlebotomy at [facility] if needed.  RTC 2 months APP, labs  (CBC, CMP, iron, ferritin)-[place]   Rushie Chestnut, PA-C 4/7/20259:38 AM

## 2023-07-31 ENCOUNTER — Encounter: Payer: Self-pay | Admitting: Medical Oncology

## 2023-08-06 ENCOUNTER — Other Ambulatory Visit (HOSPITAL_COMMUNITY): Payer: Self-pay

## 2023-08-06 ENCOUNTER — Encounter: Payer: Self-pay | Admitting: Hematology & Oncology

## 2023-08-14 ENCOUNTER — Inpatient Hospital Stay

## 2023-08-14 DIAGNOSIS — D6852 Prothrombin gene mutation: Secondary | ICD-10-CM | POA: Diagnosis not present

## 2023-08-14 NOTE — Patient Instructions (Signed)
 [facility] [place] - A DEPT OF [facility]  Discharge Instructions: Thank you for choosing [facility] to provide your oncology and hematology care.  If you have a lab appointment with the Cancer Center - please note that after April 8th, 2024, all labs will be drawn in the cancer center.  You do not have to check in or register with the main entrance as you have in the past but will complete your check-in in the cancer center.  Wear comfortable clothing and clothing appropriate for easy access to any Portacath or PICC line.   We strive to give you quality time with your provider. You may need to reschedule your appointment if you arrive late (15 or more minutes).  Arriving late affects you and other patients whose appointments are after yours.  Also, if you miss three or more appointments without notifying the office, you may be dismissed from the clinic at the provider's discretion.      For prescription refill requests, have your pharmacy contact our office and allow 72 hours for refills to be completed.    Today you received your phlebotomy   To help prevent nausea and vomiting after your treatment, we encourage you to take your nausea medication as directed.  BELOW ARE SYMPTOMS THAT SHOULD BE REPORTED IMMEDIATELY: *FEVER GREATER THAN 100.4 F (38 C) OR HIGHER *CHILLS OR SWEATING *NAUSEA AND VOMITING THAT IS NOT CONTROLLED WITH YOUR NAUSEA MEDICATION *UNUSUAL SHORTNESS OF BREATH *UNUSUAL BRUISING OR BLEEDING *URINARY PROBLEMS (pain or burning when urinating, or frequent urination) *BOWEL PROBLEMS (unusual diarrhea, constipation, pain near the anus) TENDERNESS IN MOUTH AND THROAT WITH OR WITHOUT PRESENCE OF ULCERS (sore throat, sores in mouth, or a toothache) UNUSUAL RASH, SWELLING OR PAIN  UNUSUAL VAGINAL DISCHARGE OR ITCHING   Items with * indicate a potential emergency and should be followed up as soon as possible or go to the Emergency  Department if any problems should occur.  Please show the CHEMOTHERAPY ALERT CARD or IMMUNOTHERAPY ALERT CARD at check-in to the Emergency Department and triage nurse.  Should you have questions after your visit or need to cancel or reschedule your appointment, please contact [facility] [place] - A DEPT OF [facility] [phone]  and follow the prompts.  Office hours are 8:00 a.m. to 4:30 p.m. Monday - Friday. Please note that voicemails left after 4:00 p.m. may not be returned until the following business day.  We are closed weekends and major holidays. You have access to a nurse at all times for urgent questions. Please call the main number to the clinic [phone] and follow the prompts.  For any non-urgent questions, you may also contact your provider using MyChart. We now offer e-Visits for anyone 7 and older to request care online for non-urgent symptoms. For details visit [email or web address].   Also download the MyChart app! Go to the app store, search "MyChart", open the app, select [place], and log in with your MyChart username and password.

## 2023-08-14 NOTE — Progress Notes (Signed)
 [name] [name] presents today for phlebotomy per MD orders. Phlebotomy procedure started at 1521 and ended at 1525. 500 cc removed.

## 2023-08-14 NOTE — Progress Notes (Signed)
 1523-Patient started feeling hot and felt her heart "racing" right at the end of the phlebotomy.   Phlebotomy needle removed and elevated legs and reclined patient flat. IVF already infusing .Vitals stable, will monitor for 30 min with saline infusion going per orders.   1605-vitals stable. Ok to discharge. Patient was just fatigued feeling. Patient stated she felt ok to drive home. Follow up as scheduled

## 2023-08-29 ENCOUNTER — Other Ambulatory Visit: Payer: BLUE CROSS/BLUE SHIELD

## 2023-08-29 ENCOUNTER — Ambulatory Visit: Payer: BLUE CROSS/BLUE SHIELD | Admitting: Medical Oncology

## 2023-09-12 ENCOUNTER — Other Ambulatory Visit: Payer: Self-pay

## 2023-09-12 ENCOUNTER — Emergency Department (HOSPITAL_COMMUNITY)

## 2023-09-12 ENCOUNTER — Encounter (HOSPITAL_COMMUNITY): Payer: Self-pay | Admitting: Emergency Medicine

## 2023-09-12 ENCOUNTER — Emergency Department (HOSPITAL_COMMUNITY)
Admission: EM | Admit: 2023-09-12 | Discharge: 2023-09-13 | Disposition: A | Discharge status: Home / Self Care | Attending: Emergency Medicine | Admitting: Emergency Medicine

## 2023-09-12 DIAGNOSIS — B9789 Other viral agents as the cause of diseases classified elsewhere: Secondary | ICD-10-CM | POA: Insufficient documentation

## 2023-09-12 DIAGNOSIS — R059 Cough, unspecified: Secondary | ICD-10-CM | POA: Diagnosis present

## 2023-09-12 DIAGNOSIS — J069 Acute upper respiratory infection, unspecified: Secondary | ICD-10-CM | POA: Insufficient documentation

## 2023-09-12 LAB — CBC
HCT: 36.4 % (ref 36.0–46.0)
Hemoglobin: 12.6 g/dL (ref 12.0–15.0)
MCH: 35 pg — ABNORMAL HIGH (ref 26.0–34.0)
MCHC: 34.6 g/dL (ref 30.0–36.0)
MCV: 101.1 fL — ABNORMAL HIGH (ref 80.0–100.0)
Platelets: 348 10*3/uL (ref 150–400)
RBC: 3.6 MIL/uL — ABNORMAL LOW (ref 3.87–5.11)
RDW: 12.4 % (ref 11.5–15.5)
WBC: 5.5 10*3/uL (ref 4.0–10.5)
nRBC: 0 % (ref 0.0–0.2)

## 2023-09-12 LAB — BASIC METABOLIC PANEL WITH GFR
Anion gap: 9 (ref 5–15)
BUN: 12 mg/dL (ref 6–20)
CO2: 21 mmol/L — ABNORMAL LOW (ref 22–32)
Calcium: 8.6 mg/dL — ABNORMAL LOW (ref 8.9–10.3)
Chloride: 103 mmol/L (ref 98–111)
Creatinine, Ser: 0.68 mg/dL (ref 0.44–1.00)
GFR, Estimated: >60 mL/min (ref >60)
Glucose, Bld: 87 mg/dL (ref 70–99)
Potassium: 3.7 mmol/L (ref 3.5–5.1)
Sodium: 133 mmol/L — ABNORMAL LOW (ref 135–145)

## 2023-09-12 LAB — TROPONIN I (HIGH SENSITIVITY): Troponin I (High Sensitivity): 2 ng/L (ref <18)

## 2023-09-12 MED ORDER — ALBUTEROL SULFATE HFA 108 (90 BASE) MCG/ACT IN AERS: 2.0000 | INHALATION_SPRAY | RESPIRATORY_TRACT | 3 refills | Status: DC | PRN

## 2023-09-12 MED ORDER — IOHEXOL 350 MG/ML SOLN
75.0000 mL | Freq: Once | INTRAVENOUS | Status: AC | PRN
  Administered 2023-09-12: 75 mL via INTRAVENOUS

## 2023-09-12 NOTE — ED Provider Notes (Signed)
 [place] EMERGENCY DEPARTMENT AT [facility] Provider Note   CSN: [id number] Arrival date & time: 09/12/23  2140     History  Chief Complaint  Patient presents with   Chest Pain    [name] [name] is a 48 y.o. female.   Chest Pain  48 year old female, this patient presents to the hospital with a complaint of chest pain and shortness of breath primarily today, she has a history significant for multiple pulmonary embolism at the same time, she was treated for this several years ago but is no longer anticoagulated, interestingly she is also treated for hemochromatosis as is her father.  She has had some pain in her legs, no obvious swelling, no fevers or chills though she has had a recent upper respiratory illness that she thought was just a cold.  She continues to have lots of coughing and is short of breath with even small amounts of exertion such as going up a few stairs    Home Medications Prior to Admission medications   Medication Sig Start Date End Date Taking? Authorizing Provider  albuterol  (VENTOLIN  HFA) 108 (90 Base) MCG/ACT inhaler Inhale 2 puffs into the lungs every 4 (four) hours as needed for wheezing or shortness of breath. 09/12/23  Yes Early Glisson, MD  hydroxychloroquine (PLAQUENIL) 200 MG tablet Take 200 mg by mouth daily. 03/07/22   [provider]  mupirocin  ointment (BACTROBAN ) 2 % Place 1 Application into the nose 2 (two) times daily. For 5 days 01/25/22   Lina Render, MD  norethindrone (MICRONOR) 0.35 MG tablet Take 1 tablet by mouth daily. 09/30/21   [provider]  pyridOXINE  (B-6) 50 MG tablet Take 1 tablet (50 mg total) by mouth daily. 01/25/22   Comer, Judithann Novas, MD  SUMAtriptan  (IMITREX ) 50 MG tablet TAKE 1 TABLET (50 MG TOTAL) BY MOUTH AS NEEDED FOR MIGRAINE. MAY REPEAT IN 2 HOURS IF HEADACHE PERSISTS OR RECURS. MAXIMUM 2 TABLETS IN 24 HOURS. 10/30/22   Festus Hubert, Adam R, DO  VYVANSE 70 MG capsule Take 70 mg by mouth daily.     [provider]      Allergies    Sulfa antibiotics, Morphine , Morphine  and codeine, and Erythromycin    Review of Systems   Review of Systems  Cardiovascular:  Positive for chest pain.  All other systems reviewed and are negative.   Physical Exam Updated Vital Signs BP (!) 127/99   Pulse 74   Temp 97.9 F (36.6 C) (Oral)   Resp 17   Ht 1.6 m (5\' 3" )   Wt 54.4 kg   SpO2 99%   BMI 21.26 kg/m  Physical Exam Vitals and nursing note reviewed.  Constitutional:      General: She is not in acute distress.    Appearance: She is well-developed.  HENT:     Head: Normocephalic and atraumatic.     Mouth/Throat:     Pharynx: No oropharyngeal exudate.  Eyes:     General: No scleral icterus.       Right eye: No discharge.        Left eye: No discharge.     Conjunctiva/sclera: Conjunctivae normal.     Pupils: Pupils are equal, round, and reactive to light.  Neck:     Thyroid: No thyromegaly.     Vascular: No JVD.  Cardiovascular:     Rate and Rhythm: Normal rate and regular rhythm.     Heart sounds: Normal heart sounds. No murmur heard.  No friction rub. No gallop.  Pulmonary:     Effort: Pulmonary effort is normal. No respiratory distress.     Breath sounds: Normal breath sounds. No wheezing or rales.  Abdominal:     General: Bowel sounds are normal. There is no distension.     Palpations: Abdomen is soft. There is no mass.     Tenderness: There is no abdominal tenderness.  Musculoskeletal:        General: No tenderness. Normal range of motion.     Cervical back: Normal range of motion and neck supple.     Right lower leg: No edema.     Left lower leg: No edema.  Lymphadenopathy:     Cervical: No cervical adenopathy.  Skin:    General: Skin is warm and dry.     Findings: No erythema or rash.  Neurological:     Mental Status: She is alert.     Coordination: Coordination normal.  Psychiatric:        Behavior: Behavior normal.     ED Results /  Procedures / Treatments   Labs (all labs ordered are listed, but only abnormal results are displayed) Labs Reviewed  BASIC METABOLIC PANEL WITH GFR - Abnormal; Notable for the following components:      Result Value   Sodium 133 (*)    CO2 21 (*)    Calcium 8.6 (*)    All other components within normal limits  CBC - Abnormal; Notable for the following components:   RBC 3.60 (*)    MCV 101.1 (*)    MCH 35.0 (*)    All other components within normal limits  RESP PANEL BY RT-PCR (RSV, FLU A&B, COVID)  RVPGX2  TROPONIN I (HIGH SENSITIVITY)    EKG EKG Interpretation Date/Time:  Wednesday Sep 12 2023 21:48:34 EDT Ventricular Rate:  76 PR Interval:  157 QRS Duration:  97 QT Interval:  420 QTC Calculation: 473 R Axis:   77  Text Interpretation: Sinus rhythm Normal ECG Confirmed by Early Glisson ([id number]) on 09/12/2023 10:23:01 PM  Radiology CT Angio Chest PE W and/or Wo Contrast Result Date: 09/12/2023 CLINICAL DATA:  Pulmonary embolism (PE) suspected, high prob. CP/SOB that started this morning that has gotten progressively worse. Pain is central chest/ slightly left. Pt states experiences palpitations that worsens with movement and has made pt cough. Denis n/v/d EXAM: CT ANGIOGRAPHY CHEST WITH CONTRAST TECHNIQUE: Multidetector CT imaging of the chest was performed using the standard protocol during bolus administration of intravenous contrast. Multiplanar CT image reconstructions and MIPs were obtained to evaluate the vascular anatomy. RADIATION DOSE REDUCTION: This exam was performed according to the departmental dose-optimization program which includes automated exposure control, adjustment of the mA and/or kV according to patient size and/or use of iterative reconstruction technique. CONTRAST:  75mL OMNIPAQUE  IOHEXOL  350 MG/ML SOLN COMPARISON:  Chest x-ray 09/12/2023, CT angio chest 07/13/2020 FINDINGS: Cardiovascular: Satisfactory opacification of the pulmonary arteries to the segmental  level. No evidence of pulmonary embolism. Normal heart size. No significant pericardial effusion. The thoracic aorta is normal in caliber. No atherosclerotic plaque of the thoracic aorta. No coronary artery calcifications. Mediastinum/Nodes: No enlarged mediastinal, hilar, or axillary lymph nodes. Thyroid gland, trachea, and esophagus demonstrate no significant findings. Lungs/Pleura: No focal consolidation. No pulmonary nodule. No pulmonary mass. No pleural effusion. No pneumothorax. Upper Abdomen: No acute abnormality. Musculoskeletal: No chest wall abnormality.  Bilateral breast implants. No suspicious lytic or blastic osseous lesions. No acute displaced fracture. Multilevel degenerative changes  of the spine. Review of the MIP images confirms the above findings. IMPRESSION: 1. No pulmonary embolus. 2. No acute intrathoracic abnormality. Electronically Signed   By: Morgane  Naveau M.D.   On: 09/12/2023 23:08   DG Chest 2 View Result Date: 09/12/2023 CLINICAL DATA:  Chest pain EXAM: CHEST - 2 VIEW COMPARISON:  01/25/2022 FINDINGS: The heart size and mediastinal contours are within normal limits. Both lungs are clear. The visualized skeletal structures are unremarkable. IMPRESSION: No active cardiopulmonary disease. Electronically Signed   By: Esmeralda Hedge M.D.   On: 09/12/2023 22:22    Procedures Procedures    Medications Ordered in ED Medications  iohexol  (OMNIPAQUE ) 350 MG/ML injection 75 mL (75 mLs Intravenous Contrast Given 09/12/23 2249)    ED Course/ Medical Decision Making/ A&P                                 Medical Decision Making Amount and/or Complexity of Data Reviewed Labs: ordered. Radiology: ordered.  Risk Prescription drug management.    This patient presents to the ED for concern of chest pain and shortness of breath, this involves an extensive number of treatment options, and is a complaint that carries with it a high risk of complications and morbidity.  The  differential diagnosis includes pulmonary embolism certainly leaves the differential although she is not hypoxic nor she tachycardic.  With her history we will need to obtain a CT angiogram to be sure.  This does not appear to be cardiac in nature, she has no exertional chest pain and her EKG is totally normal.  Would also consider pneumothorax although less likely, pericarditis, pneumonia especially given her recent respiratory illness, we can check for viral illnesses such as COVID and flu   Co morbidities that complicate the patient evaluation  History of pulmonary embolism   Additional history obtained:  Additional history obtained from electronic medical record, treated in the outpatient setting for hemochromatosis with infusions, External records from outside source obtained and reviewed including multiple PE studies in the past most recently was 2022 which was negative, the year prior in 2021 negative, pulmonary embolism seen and images viewed from 2019, large bilateral central PEs, they were classified as "submassive"   Lab Tests:  I Ordered, and personally interpreted labs.  The pertinent results include: CBC and metabolic panel unremarkable as is the troponin   Imaging Studies ordered:  I ordered imaging studies including CT angiogram of the chest shows no pulmonary embolism, no pneumonia I independently visualized and interpreted imaging which showed no acute findings I agree with the radiologist interpretation   Cardiac Monitoring: / EKG:  The patient was maintained on a cardiac monitor.  I personally viewed and interpreted the cardiac monitored which showed an underlying rhythm of: Normal sinus rhythm   Problem List / ED Course / Critical interventions / Medication management  Patient appears well, she is not hypoxic and states she is having palpitations but there is nothing on the monitor to corroborate any ectopy.  She is frustrated by this but states that she is  agreeable to discharge, pending viral testing at the time of change of shift, care signed out to Dr. Wallis Gun to follow-up results and disposition accordingly.  Home with albuterol  to help with cough.  Interestingly the patient has not been coughing very much in the ED thankfully.  No hypoxia I have reviewed the patients home medicines and have made adjustments as  needed   Social Determinants of Health:  Piror PE   Test / Admission - Considered:  Considered admission but patient has no unstable vital signs, no signs of sepsis or PE         Final Clinical Impression(s) / ED Diagnoses Final diagnoses:  Viral URI with cough    Rx / DC Orders ED Discharge Orders          Ordered    albuterol  (VENTOLIN  HFA) 108 (90 Base) MCG/ACT inhaler  Every 4 hours PRN        09/12/23 2325              Early Glisson, MD 09/12/23 2326

## 2023-09-12 NOTE — ED Triage Notes (Signed)
 Pt to ed pov c/o CP/SOB that started this morning that has gotten progressively worse. Pain is central chest/ slightly left. Pt states experiences palpitations that worsens with movement and has made pt cough. Denis n/v/d. Pt endorses dizziness. No headache. Numbness and tingling on Right side that may or may not be new

## 2023-09-12 NOTE — Discharge Instructions (Addendum)
 Your testing was normal, no signs of blood clot, you can use the albuterol  to help with shortness of breath, this may last for a few more days, it should gradually improve, come back to the ER for worsening symptoms

## 2023-09-13 ENCOUNTER — Telehealth: Payer: Self-pay

## 2023-09-13 LAB — RESP PANEL BY RT-PCR (RSV, FLU A&B, COVID)  RVPGX2
Influenza A by PCR: NEGATIVE
Influenza B by PCR: NEGATIVE
Resp Syncytial Virus by PCR: NEGATIVE
SARS Coronavirus 2 by RT PCR: NEGATIVE

## 2023-09-13 NOTE — Telephone Encounter (Signed)
 Received phone call from patient stating that she was unhappy with the care she received in the ER last night "as they did not take my concerns seriously" Pt states she was evaluated for SOB and chest pain. Pt states she requested an iron panel and a d-dimer but her concerns were "dismissed" Reviewed with patient that her CT-angiogram was negative which is a relief as she does not have a PE. Pt requesting a d-dimer lab and iron panel to be drawn by our clinic. Pt also c/o right leg and calf pain for one week. Pt states that when she told the ER MD that she hadn't traveled they were no longer concerned about her leg.  This RN discussed pt concerns with Sunnie England, PA. Per [place] recommendation pt is to be evaluated by an urgent care ASAP for evaluation of right leg and calf pain for 1 week. Pt aware to let us  know the results and from there she can have her appointment moved up and labs drawn for evaluation of low iron.  Pt states she will go to urgent care tonight. Pt educated to let them know her symptoms and history. Pt agreeable to plan and appreciative of help.

## 2023-09-27 ENCOUNTER — Inpatient Hospital Stay

## 2023-09-27 ENCOUNTER — Ambulatory Visit: Admitting: Medical Oncology

## 2023-09-29 ENCOUNTER — Telehealth: Admitting: Physician Assistant

## 2023-09-29 DIAGNOSIS — H1013 Acute atopic conjunctivitis, bilateral: Secondary | ICD-10-CM | POA: Diagnosis not present

## 2023-10-04 ENCOUNTER — Other Ambulatory Visit: Payer: Self-pay

## 2023-10-04 DIAGNOSIS — D6852 Prothrombin gene mutation: Secondary | ICD-10-CM

## 2023-10-04 DIAGNOSIS — I2699 Other pulmonary embolism without acute cor pulmonale: Secondary | ICD-10-CM

## 2023-10-05 ENCOUNTER — Inpatient Hospital Stay: Attending: Family

## 2023-10-05 ENCOUNTER — Encounter: Payer: Self-pay | Admitting: Hematology & Oncology

## 2023-10-05 ENCOUNTER — Inpatient Hospital Stay: Admitting: Hematology & Oncology

## 2023-10-05 VITALS — BP 122/78 | HR 108 | Temp 98.8°F | Resp 19 | Ht 63.0 in | Wt 121.0 lb

## 2023-10-05 DIAGNOSIS — E079 Disorder of thyroid, unspecified: Secondary | ICD-10-CM

## 2023-10-05 DIAGNOSIS — I2699 Other pulmonary embolism without acute cor pulmonale: Secondary | ICD-10-CM | POA: Diagnosis not present

## 2023-10-05 DIAGNOSIS — I441 Atrioventricular block, second degree: Secondary | ICD-10-CM

## 2023-10-05 DIAGNOSIS — D6852 Prothrombin gene mutation: Secondary | ICD-10-CM | POA: Diagnosis present

## 2023-10-05 DIAGNOSIS — F418 Other specified anxiety disorders: Secondary | ICD-10-CM

## 2023-10-05 DIAGNOSIS — K589 Irritable bowel syndrome without diarrhea: Secondary | ICD-10-CM

## 2023-10-05 LAB — CMP (CANCER CENTER ONLY)
ALT: 17 U/L (ref 0–44)
AST: 27 U/L (ref 15–41)
Albumin: 4.7 g/dL (ref 3.5–5.0)
Alkaline Phosphatase: 46 U/L (ref 38–126)
Anion gap: 10 (ref 5–15)
BUN: 15 mg/dL (ref 6–20)
CO2: 25 mmol/L (ref 22–32)
Calcium: 9.4 mg/dL (ref 8.9–10.3)
Chloride: 103 mmol/L (ref 98–111)
Creatinine: 0.87 mg/dL (ref 0.44–1.00)
GFR, Estimated: >60 mL/min (ref >60)
Glucose, Bld: 131 mg/dL — ABNORMAL HIGH (ref 70–99)
Potassium: 3.5 mmol/L (ref 3.5–5.1)
Sodium: 138 mmol/L (ref 135–145)
Total Bilirubin: 0.4 mg/dL (ref 0.0–1.2)
Total Protein: 7.5 g/dL (ref 6.5–8.1)

## 2023-10-05 LAB — IRON AND IRON BINDING CAPACITY (CC-WL,HP ONLY)
Iron: 86 ug/dL (ref 28–170)
Saturation Ratios: 23 % (ref 10.4–31.8)
TIBC: 381 ug/dL (ref 250–450)
UIBC: 295 ug/dL

## 2023-10-05 LAB — CBC
HCT: 38.1 % (ref 36.0–46.0)
Hemoglobin: 13.2 g/dL (ref 12.0–15.0)
MCH: 33.7 pg (ref 26.0–34.0)
MCHC: 34.6 g/dL (ref 30.0–36.0)
MCV: 97.2 fL (ref 80.0–100.0)
Platelets: 310 10*3/uL (ref 150–400)
RBC: 3.92 MIL/uL (ref 3.87–5.11)
RDW: 11.6 % (ref 11.5–15.5)
WBC: 7.7 10*3/uL (ref 4.0–10.5)
nRBC: 0 % (ref 0.0–0.2)

## 2023-10-05 LAB — FERRITIN: Ferritin: 153 ng/mL (ref 11–307)

## 2023-10-05 MED ORDER — OLOPATADINE HCL 0.1 % OP SOLN: 1.0000 [drp] | Freq: Two times a day (BID) | OPHTHALMIC | 0 refills | Status: DC

## 2023-10-05 NOTE — Progress Notes (Unsigned)
 Hematology and Oncology Follow Up Visit  [name] [name] [id number] 10-21-1975 48 y.o. 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   Today she states that she has been doing better since starting trazodone. Fatigue is better.  Still getting headaches from weather changes Her last phlebotomy was on 05/15/2023 No fever, chills, n/v, cough, rash, dizziness, SOB, chest pain, palpitations, abdominal pain or changes in bowel or bladder habits.  She does have a history of provoked DVT while on OCP with history of heterozygous prothrombin IIgene mutation. She finished 1 year of anticoagulation therapy in July 2020 and elected to stop her Eliquis . She has not had a recurrence of clotting event since.  No swelling in her extremities at this time.  Occasional tingling in her fingertips.  No falls or syncope reported.  Appetite and hydration are good.    Wt Readings from Last 3 Encounters:  10/05/23 121 lb (54.9 kg)  09/12/23 120 lb (54.4 kg)  07/30/23 120 lb (54.4 kg)   ECOG Performance Status: 1 - Symptomatic but completely ambulatory  Medications:  Allergies as of 10/05/2023       Reactions   Sulfa Antibiotics Anaphylaxis, Hives   Morphine  Other (See Comments)   aggressiveness   Morphine  And Codeine Itching, Other (See Comments)   hallucinations   Erythromycin Other (See Comments)   Unknown reaction from childhood        Medication List        Accurate as of October 05, 2023  4:13 PM. If you have any questions, ask your nurse or doctor.          STOP taking these  medications    albuterol  108 (90 Base) MCG/ACT inhaler Commonly known as: VENTOLIN  HFA Stopped by: Arieal Cuoco R Raymona Boss   mupirocin  ointment 2 % Commonly known as: BACTROBAN  Stopped by: Delaney Schnick R Angelito Hopping   SUMAtriptan  50 MG tablet Commonly known as: IMITREX  Stopped by: Ivor Mars       TAKE these medications    hydroxychloroquine 200 MG tablet Commonly known as: PLAQUENIL Take 200 mg by mouth daily.   norethindrone 0.35 MG tablet Commonly known as: MICRONOR Take 1 tablet by mouth daily.   pyridOXINE  50 MG tablet Commonly known as: B-6 Take 1 tablet (50 mg total) by mouth daily.   Vyvanse 70 MG capsule Generic drug: lisdexamfetamine Take 70 mg by mouth daily.        Allergies:  Allergies  Allergen Reactions   Sulfa Antibiotics Anaphylaxis and Hives   Morphine  Other (See Comments)    aggressiveness   Morphine  And Codeine Itching and Other (See Comments)    hallucinations   Erythromycin Other (See Comments)    Unknown reaction from childhood    Past Medical History, Surgical history, Social history, and Family History were reviewed and updated.  Review of Systems: All other 10 point review of systems is negative.   Physical Exam:  height is 5' 3 (1.6 m) and weight is 121 lb (54.9 kg). Her oral temperature is 98.8 F (37.1 C). Her blood pressure is 122/78  and her pulse is 108 (abnormal). Her respiration is 19 and oxygen saturation is 100%.   Wt Readings from Last 3 Encounters:  10/05/23 121 lb (54.9 kg)  09/12/23 120 lb (54.4 kg)  07/30/23 120 lb (54.4 kg)   Constitutional: Well appearing, AA x 3 Ocular: Sclerae unicteric, pupils equal, round and reactive to light Ear-nose-throat: Oropharynx clear, dentition fair Lymphatic: No cervical or supraclavicular adenopathy Lungs no rales or rhonchi, good excursion bilaterally Heart regular rate and rhythm, no murmur appreciated Abd soft, nontender, positive bowel sounds MSK no focal spinal tenderness, no  joint edema Neuro: non-focal, well-oriented, appropriate affect   Lab Results  Component Value Date   WBC 7.7 10/05/2023   HGB 13.2 10/05/2023   HCT 38.1 10/05/2023   MCV 97.2 10/05/2023   PLT 310 10/05/2023   Lab Results  Component Value Date   FERRITIN 99 07/30/2023   IRON 143 07/30/2023   TIBC 344 07/30/2023   UIBC 201 07/30/2023   IRONPCTSAT 42 (H) 07/30/2023   Lab Results  Component Value Date   RETICCTPCT 1.9 01/13/2019   RBC 3.92 10/05/2023   No results found for: KPAFRELGTCHN, LAMBDASER, [facility] Lab Results  Component Value Date   IGA 264 12/19/2006   No results found for: Tanner Fanny, A1GS, Henretta Lodge, MSPIKE, SPEI   Chemistry      Component Value Date/Time   NA 138 10/05/2023 1522   K 3.5 10/05/2023 1522   CL 103 10/05/2023 1522   CO2 25 10/05/2023 1522   BUN 15 10/05/2023 1522   CREATININE 0.87 10/05/2023 1522   CREATININE 0.86 10/16/2017 1454      Component Value Date/Time   CALCIUM 9.4 10/05/2023 1522   ALKPHOS 46 10/05/2023 1522   AST 27 10/05/2023 1522   ALT 17 10/05/2023 1522   BILITOT 0.4 10/05/2023 1522     No diagnosis found.   Impression and Plan: [name] [name] is a very pleasant 48 yo caucasian female heterozygous for the Prothrombin II gene mutation with bilateral pulmonary emboli. She also has hemochromatosis, heterozygote for H63D. Current management for both include therapeutic Phlebotomy to keep Ferritin < 100 and %Sat < 30.   CBC and CMP reviewed today and appear stable.  Hgb 13.4  Iron studies are pending. We will set her up for phlebotomy at [facility] if needed.  RTC 2 months APP, labs (CBC, CMP, iron, ferritin)-[place]   Ivor Mars, MD 6/13/20254:13 PM

## 2023-10-05 NOTE — Progress Notes (Signed)
 E-Visit for [employer]   We are sorry that you are not feeling well.  Here is how we plan to help!  Based on what you have shared with me it looks like you have conjunctivitis.  Conjunctivitis is a common inflammatory or infectious condition of the eye that is often referred to as pink eye.  In most cases it is contagious (viral or bacterial). However, not all conjunctivitis requires antibiotics (ex. Allergic).  We have made appropriate suggestions for you based upon your presentation.  Olopatadine 0.1% Use 1 drop in each eye twice daily for 5-7 days for allergic conjunctivitis.  Pink eye can be highly contagious.  It is typically spread through direct contact with secretions, or contaminated objects or surfaces that one may have touched.  Strict handwashing is suggested with soap and water is urged.  If not available, use alcohol based had sanitizer.  Avoid unnecessary touching of the eye.  If you wear contact lenses, you will need to refrain from wearing them until you see no white discharge from the eye for at least 24 hours after being on medication.  You should see symptom improvement in 1-2 days after starting the medication regimen.  Call us  if symptoms are not improved in 1-2 days.  Home Care: Wash your hands often! Do not wear your contacts until you complete your treatment plan. Avoid sharing towels, bed linen, personal items with a person who has pink eye. See attention for anyone in your home with similar symptoms.  Get Help Right Away If: Your symptoms do not improve. You develop blurred or loss of vision. Your symptoms worsen (increased discharge, pain or redness)   Thank you for choosing an e-visit.  Your e-visit answers were reviewed by a board certified advanced clinical practitioner to complete your personal care plan. Depending upon the condition, your plan could have included both over the counter or prescription medications.  Please review your pharmacy choice. Make  sure the pharmacy is open so you can pick up prescription now. If there is a problem, you may contact your provider through [employer] and have the prescription routed to another pharmacy.  Your safety is important to us . If you have drug allergies check your prescription carefully.   For the next 24 hours you can use MyChart to ask questions about today's visit, request a non-urgent call back, or ask for a work or school excuse. You will get an email in the next two days asking about your experience. I hope that your e-visit has been valuable and will speed your recovery.    I have spent 5 minutes in review of e-visit questionnaire, review and updating patient chart, medical decision making and response to patient.   Angelia Kelp, PA-C

## 2023-10-06 ENCOUNTER — Ambulatory Visit: Payer: Self-pay | Admitting: Hematology & Oncology

## 2023-10-08 ENCOUNTER — Encounter: Payer: Self-pay | Admitting: *Deleted

## 2023-10-15 ENCOUNTER — Encounter: Payer: Self-pay | Admitting: Hematology & Oncology

## 2024-02-28 ENCOUNTER — Telehealth: Admitting: Physician Assistant

## 2024-02-28 DIAGNOSIS — H6992 Unspecified Eustachian tube disorder, left ear: Secondary | ICD-10-CM | POA: Diagnosis not present

## 2024-02-28 MED ORDER — FLUTICASONE PROPIONATE 50 MCG/ACT NA SUSP: 2.0000 | Freq: Every day | NASAL | 0 refills | Status: AC

## 2024-02-28 NOTE — Progress Notes (Signed)
